# Patient Record
Sex: Male | Born: 1940 | Race: White | Hispanic: No | Marital: Married | Smoking: Former smoker
Health system: Southern US, Community
[De-identification: ages and names within clinical notes are randomized; demographics above are authoritative.]

## PROBLEM LIST (undated history)

## (undated) DIAGNOSIS — D649 Anemia, unspecified: Secondary | ICD-10-CM

## (undated) DIAGNOSIS — M4802 Spinal stenosis, cervical region: Secondary | ICD-10-CM

## (undated) DIAGNOSIS — K2950 Unspecified chronic gastritis without bleeding: Secondary | ICD-10-CM

## (undated) DIAGNOSIS — F419 Anxiety disorder, unspecified: Secondary | ICD-10-CM

## (undated) DIAGNOSIS — I10 Essential (primary) hypertension: Secondary | ICD-10-CM

## (undated) DIAGNOSIS — M48061 Spinal stenosis, lumbar region without neurogenic claudication: Secondary | ICD-10-CM

## (undated) DIAGNOSIS — M1990 Unspecified osteoarthritis, unspecified site: Secondary | ICD-10-CM

## (undated) DIAGNOSIS — F32A Depression, unspecified: Secondary | ICD-10-CM

## (undated) DIAGNOSIS — J449 Chronic obstructive pulmonary disease, unspecified: Secondary | ICD-10-CM

## (undated) DIAGNOSIS — E785 Hyperlipidemia, unspecified: Secondary | ICD-10-CM

## (undated) DIAGNOSIS — K5790 Diverticulosis of intestine, part unspecified, without perforation or abscess without bleeding: Secondary | ICD-10-CM

## (undated) DIAGNOSIS — I639 Cerebral infarction, unspecified: Secondary | ICD-10-CM

## (undated) DIAGNOSIS — G459 Transient cerebral ischemic attack, unspecified: Secondary | ICD-10-CM

## (undated) DIAGNOSIS — F1010 Alcohol abuse, uncomplicated: Secondary | ICD-10-CM

## (undated) DIAGNOSIS — C61 Malignant neoplasm of prostate: Secondary | ICD-10-CM

## (undated) DIAGNOSIS — R51 Headache: Secondary | ICD-10-CM

## (undated) HISTORY — PX: INSERTION PROSTATE RADIATION SEED: SUR718

## (undated) HISTORY — DX: Diverticulosis of intestine, part unspecified, without perforation or abscess without bleeding: K57.90

## (undated) HISTORY — DX: Alcohol abuse, uncomplicated: F10.10

## (undated) HISTORY — DX: Anemia, unspecified: D64.9

## (undated) HISTORY — DX: Depression, unspecified: F32.A

## (undated) HISTORY — DX: Malignant neoplasm of prostate: C61

## (undated) HISTORY — DX: Transient cerebral ischemic attack, unspecified: G45.9

## (undated) HISTORY — PX: COLONOSCOPY: SHX174

## (undated) HISTORY — DX: Hyperlipidemia, unspecified: E78.5

## (undated) HISTORY — PX: RHINOPLASTY: SUR1284

## (undated) HISTORY — DX: Spinal stenosis, cervical region: M48.02

## (undated) HISTORY — DX: Spinal stenosis, lumbar region without neurogenic claudication: M48.061

## (undated) HISTORY — DX: Essential (primary) hypertension: I10

## (undated) HISTORY — DX: Unspecified chronic gastritis without bleeding: K29.50

## (undated) HISTORY — PX: SPINE SURGERY: SHX786

---
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
## 2002-07-02 ENCOUNTER — Encounter: Payer: Self-pay | Admitting: Urology

## 2002-07-02 ENCOUNTER — Encounter: Admission: RE | Admit: 2002-07-02 | Discharge: 2002-07-02 | Payer: Self-pay | Admitting: Urology

## 2002-07-17 ENCOUNTER — Ambulatory Visit: Admission: RE | Admit: 2002-07-17 | Discharge: 2002-08-01 | Payer: Self-pay | Admitting: Radiation Oncology

## 2002-07-23 ENCOUNTER — Encounter: Payer: Self-pay | Admitting: Urology

## 2002-07-23 ENCOUNTER — Encounter: Admission: RE | Admit: 2002-07-23 | Discharge: 2002-07-23 | Payer: Self-pay | Admitting: Urology

## 2002-10-17 DIAGNOSIS — C61 Malignant neoplasm of prostate: Secondary | ICD-10-CM

## 2002-10-17 HISTORY — DX: Malignant neoplasm of prostate: C61

## 2004-01-29 ENCOUNTER — Encounter: Admission: RE | Admit: 2004-01-29 | Discharge: 2004-01-29 | Payer: Self-pay | Admitting: Family Medicine

## 2004-02-08 ENCOUNTER — Encounter: Admission: RE | Admit: 2004-02-08 | Discharge: 2004-02-08 | Payer: Self-pay | Admitting: Family Medicine

## 2009-05-04 ENCOUNTER — Inpatient Hospital Stay (HOSPITAL_COMMUNITY): Admission: EM | Admit: 2009-05-04 | Discharge: 2009-05-05 | Payer: Self-pay | Admitting: Emergency Medicine

## 2009-06-17 DIAGNOSIS — G459 Transient cerebral ischemic attack, unspecified: Secondary | ICD-10-CM

## 2009-06-17 HISTORY — DX: Transient cerebral ischemic attack, unspecified: G45.9

## 2009-06-21 ENCOUNTER — Inpatient Hospital Stay (HOSPITAL_COMMUNITY): Admission: EM | Admit: 2009-06-21 | Discharge: 2009-06-24 | Payer: Self-pay | Admitting: Internal Medicine

## 2009-06-23 ENCOUNTER — Ambulatory Visit: Payer: Self-pay | Admitting: Surgery

## 2009-06-23 ENCOUNTER — Encounter (INDEPENDENT_AMBULATORY_CARE_PROVIDER_SITE_OTHER): Payer: Self-pay | Admitting: Internal Medicine

## 2009-10-15 ENCOUNTER — Encounter: Admission: RE | Admit: 2009-10-15 | Discharge: 2009-10-15 | Payer: Self-pay | Admitting: Family Medicine

## 2010-03-03 ENCOUNTER — Encounter: Admission: RE | Admit: 2010-03-03 | Discharge: 2010-03-03 | Payer: Self-pay | Admitting: Family Medicine

## 2010-04-13 ENCOUNTER — Encounter: Admission: RE | Admit: 2010-04-13 | Discharge: 2010-04-13 | Payer: Self-pay | Admitting: Family Medicine

## 2010-04-22 ENCOUNTER — Encounter: Admission: RE | Admit: 2010-04-22 | Discharge: 2010-04-22 | Payer: Self-pay | Admitting: Family Medicine

## 2010-05-01 ENCOUNTER — Encounter: Admission: RE | Admit: 2010-05-01 | Discharge: 2010-05-01 | Payer: Self-pay | Admitting: Family Medicine

## 2010-06-02 ENCOUNTER — Encounter: Admission: RE | Admit: 2010-06-02 | Discharge: 2010-06-02 | Payer: Self-pay | Admitting: Family Medicine

## 2010-08-25 ENCOUNTER — Other Ambulatory Visit: Payer: Self-pay | Admitting: Emergency Medicine

## 2010-08-26 ENCOUNTER — Ambulatory Visit: Payer: Self-pay | Admitting: Psychiatry

## 2010-08-26 ENCOUNTER — Inpatient Hospital Stay (HOSPITAL_COMMUNITY): Admission: RE | Admit: 2010-08-26 | Discharge: 2010-08-30 | Payer: Self-pay | Admitting: Psychiatry

## 2010-09-29 ENCOUNTER — Inpatient Hospital Stay: Payer: Self-pay | Admitting: Psychiatry

## 2010-10-06 ENCOUNTER — Ambulatory Visit (HOSPITAL_COMMUNITY)
Admission: RE | Admit: 2010-10-06 | Discharge: 2010-10-06 | Payer: Self-pay | Source: Home / Self Care | Attending: Psychiatry | Admitting: Psychiatry

## 2010-10-06 ENCOUNTER — Other Ambulatory Visit (HOSPITAL_COMMUNITY)
Admission: RE | Admit: 2010-10-06 | Discharge: 2010-11-16 | Payer: Self-pay | Source: Home / Self Care | Attending: Psychiatry | Admitting: Psychiatry

## 2010-11-01 LAB — URINE DRUGS OF ABUSE SCREEN W ALC, ROUTINE (REF LAB)
Amphetamine Screen, Ur: NEGATIVE
Amphetamine Screen, Ur: NEGATIVE
Barbiturate Quant, Ur: NEGATIVE
Barbiturate Quant, Ur: NEGATIVE
Benzodiazepines.: NEGATIVE
Benzodiazepines.: NEGATIVE
Cocaine Metabolites: NEGATIVE
Cocaine Metabolites: NEGATIVE
Creatinine,U: 49.8 mg/dL
Creatinine,U: 45.5 mg/dL
Ethyl Alcohol: <10 mg/dL (ref <10)
Ethyl Alcohol: <10 mg/dL (ref <10)
Marijuana Metabolite: NEGATIVE
Marijuana Metabolite: NEGATIVE
Methadone: NEGATIVE
Methadone: NEGATIVE
Opiate Screen, Urine: NEGATIVE
Opiate Screen, Urine: NEGATIVE
Phencyclidine (PCP): NEGATIVE
Phencyclidine (PCP): NEGATIVE
Propoxyphene: NEGATIVE
Propoxyphene: NEGATIVE

## 2010-11-09 LAB — URINE DRUGS OF ABUSE SCREEN W ALC, ROUTINE (REF LAB)
Amphetamine Screen, Ur: NEGATIVE
Barbiturate Quant, Ur: NEGATIVE
Benzodiazepines.: NEGATIVE
Cocaine Metabolites: NEGATIVE
Creatinine,U: 18.2 mg/dL
Ethyl Alcohol: <10 mg/dL (ref <10)
Marijuana Metabolite: NEGATIVE
Methadone: NEGATIVE
Opiate Screen, Urine: NEGATIVE
Phencyclidine (PCP): NEGATIVE
Propoxyphene: NEGATIVE

## 2010-11-11 LAB — URINE DRUGS OF ABUSE SCREEN W ALC, ROUTINE (REF LAB)
Benzodiazepines.: NEGATIVE
Cocaine Metabolites: NEGATIVE
Marijuana Metabolite: NEGATIVE
Methadone: NEGATIVE
Opiate Screen, Urine: NEGATIVE
Phencyclidine (PCP): NEGATIVE
Propoxyphene: NEGATIVE

## 2010-11-17 ENCOUNTER — Ambulatory Visit (HOSPITAL_COMMUNITY): Payer: Medicare Other | Admitting: Psychiatry

## 2010-11-18 ENCOUNTER — Ambulatory Visit (HOSPITAL_COMMUNITY): Payer: Self-pay | Admitting: Psychiatry

## 2010-11-19 ENCOUNTER — Other Ambulatory Visit (HOSPITAL_COMMUNITY): Payer: Medicare Other | Attending: Psychiatry | Admitting: Psychiatry

## 2010-11-19 DIAGNOSIS — Z888 Allergy status to other drugs, medicaments and biological substances status: Secondary | ICD-10-CM | POA: Insufficient documentation

## 2010-11-19 DIAGNOSIS — Z8546 Personal history of malignant neoplasm of prostate: Secondary | ICD-10-CM | POA: Insufficient documentation

## 2010-11-19 DIAGNOSIS — Z91199 Patient's noncompliance with other medical treatment and regimen due to unspecified reason: Secondary | ICD-10-CM | POA: Insufficient documentation

## 2010-11-19 DIAGNOSIS — IMO0002 Reserved for concepts with insufficient information to code with codable children: Secondary | ICD-10-CM | POA: Insufficient documentation

## 2010-11-19 DIAGNOSIS — F1220 Cannabis dependence, uncomplicated: Secondary | ICD-10-CM | POA: Insufficient documentation

## 2010-11-19 DIAGNOSIS — F1020 Alcohol dependence, uncomplicated: Secondary | ICD-10-CM | POA: Insufficient documentation

## 2010-11-19 DIAGNOSIS — Z9119 Patient's noncompliance with other medical treatment and regimen: Secondary | ICD-10-CM | POA: Insufficient documentation

## 2010-11-19 DIAGNOSIS — M549 Dorsalgia, unspecified: Secondary | ICD-10-CM | POA: Insufficient documentation

## 2010-11-19 DIAGNOSIS — Z6379 Other stressful life events affecting family and household: Secondary | ICD-10-CM | POA: Insufficient documentation

## 2010-11-19 DIAGNOSIS — Z880 Allergy status to penicillin: Secondary | ICD-10-CM | POA: Insufficient documentation

## 2010-11-20 LAB — URINE DRUGS OF ABUSE SCREEN W ALC, ROUTINE (REF LAB)
Amphetamine Screen, Ur: NEGATIVE
Benzodiazepines.: NEGATIVE
Cocaine Metabolites: NEGATIVE
Methadone: NEGATIVE
Opiate Screen, Urine: NEGATIVE
Propoxyphene: NEGATIVE

## 2010-11-22 ENCOUNTER — Other Ambulatory Visit (HOSPITAL_COMMUNITY): Payer: Medicare Other | Admitting: Psychiatry

## 2010-11-24 ENCOUNTER — Other Ambulatory Visit (HOSPITAL_COMMUNITY): Payer: Medicare Other | Admitting: Psychiatry

## 2010-11-26 ENCOUNTER — Other Ambulatory Visit (HOSPITAL_COMMUNITY): Payer: Medicare Other | Admitting: Psychiatry

## 2010-11-29 ENCOUNTER — Other Ambulatory Visit (HOSPITAL_COMMUNITY): Payer: Medicare Other | Admitting: Psychiatry

## 2010-12-01 ENCOUNTER — Other Ambulatory Visit (HOSPITAL_COMMUNITY): Payer: Medicare Other | Admitting: Psychiatry

## 2010-12-03 ENCOUNTER — Other Ambulatory Visit (HOSPITAL_COMMUNITY): Payer: Medicare Other | Admitting: Psychiatry

## 2010-12-06 ENCOUNTER — Other Ambulatory Visit (HOSPITAL_COMMUNITY): Payer: Medicare Other | Admitting: Psychiatry

## 2010-12-08 ENCOUNTER — Other Ambulatory Visit (HOSPITAL_COMMUNITY): Payer: Medicare Other | Admitting: Psychiatry

## 2010-12-10 ENCOUNTER — Other Ambulatory Visit (HOSPITAL_COMMUNITY): Payer: Medicare Other | Admitting: Psychiatry

## 2010-12-13 ENCOUNTER — Other Ambulatory Visit (HOSPITAL_COMMUNITY): Payer: Medicare Other | Admitting: Psychiatry

## 2010-12-15 ENCOUNTER — Other Ambulatory Visit (HOSPITAL_COMMUNITY): Payer: Medicare Other | Admitting: Psychiatry

## 2010-12-17 ENCOUNTER — Other Ambulatory Visit (HOSPITAL_COMMUNITY): Payer: Medicare Other | Admitting: Psychiatry

## 2010-12-17 ENCOUNTER — Other Ambulatory Visit (HOSPITAL_COMMUNITY): Payer: Medicare Other | Attending: Psychiatry | Admitting: Psychiatry

## 2010-12-20 ENCOUNTER — Other Ambulatory Visit (HOSPITAL_COMMUNITY): Payer: Medicare Other | Admitting: Psychiatry

## 2010-12-22 ENCOUNTER — Other Ambulatory Visit (HOSPITAL_COMMUNITY): Payer: Medicare Other | Admitting: Psychiatry

## 2010-12-24 ENCOUNTER — Other Ambulatory Visit (HOSPITAL_COMMUNITY): Payer: Medicare Other | Admitting: Psychiatry

## 2010-12-27 ENCOUNTER — Other Ambulatory Visit (HOSPITAL_COMMUNITY): Payer: Medicare Other | Admitting: Psychiatry

## 2010-12-28 LAB — BASIC METABOLIC PANEL
BUN: 12 mg/dL (ref 6–23)
Calcium: 9.2 mg/dL (ref 8.4–10.5)
GFR calc non Af Amer: >60 mL/min (ref >60)
Glucose, Bld: 89 mg/dL (ref 70–99)
Potassium: 4.3 mEq/L (ref 3.5–5.1)
Sodium: 134 mEq/L — ABNORMAL LOW (ref 135–145)

## 2010-12-28 LAB — CBC
HCT: 38.5 % — ABNORMAL LOW (ref 39.0–52.0)
Hemoglobin: 13.3 g/dL (ref 13.0–17.0)
MCHC: 34.4 g/dL (ref 30.0–36.0)
RDW: 13.6 % (ref 11.5–15.5)
WBC: 6.9 10*3/uL (ref 4.0–10.5)

## 2010-12-28 LAB — ETHANOL: Alcohol, Ethyl (B): 146 mg/dL — ABNORMAL HIGH (ref 0–10)

## 2010-12-28 LAB — DIFFERENTIAL
Basophils Absolute: 0 10*3/uL (ref 0.0–0.1)
Basophils Relative: 0 % (ref 0–1)
Lymphocytes Relative: 15 % (ref 12–46)
Neutro Abs: 4.8 10*3/uL (ref 1.7–7.7)
Neutrophils Relative %: 69 % (ref 43–77)

## 2010-12-28 LAB — HEPATIC FUNCTION PANEL
Bilirubin, Direct: 0.2 mg/dL (ref 0.0–0.3)
Indirect Bilirubin: 0.8 mg/dL (ref 0.3–0.9)
Total Protein: 5.9 g/dL — ABNORMAL LOW (ref 6.0–8.3)

## 2010-12-28 LAB — RAPID URINE DRUG SCREEN, HOSP PERFORMED
Amphetamines: NOT DETECTED
Opiates: NOT DETECTED
Tetrahydrocannabinol: NOT DETECTED

## 2010-12-28 LAB — FOLATE: Folate: >20 ng/mL

## 2010-12-28 LAB — TSH: TSH: 1.135 u[IU]/mL (ref 0.350–4.500)

## 2010-12-28 LAB — VITAMIN B12: Vitamin B-12: 667 pg/mL (ref 211–911)

## 2010-12-29 ENCOUNTER — Other Ambulatory Visit (HOSPITAL_COMMUNITY): Payer: Medicare Other | Admitting: Psychiatry

## 2010-12-31 ENCOUNTER — Other Ambulatory Visit (HOSPITAL_COMMUNITY): Payer: Medicare Other | Admitting: Psychiatry

## 2011-01-03 ENCOUNTER — Other Ambulatory Visit (HOSPITAL_COMMUNITY): Payer: Medicare Other | Admitting: Psychiatry

## 2011-01-05 ENCOUNTER — Other Ambulatory Visit (HOSPITAL_COMMUNITY): Payer: Medicare Other | Admitting: Psychiatry

## 2011-01-07 ENCOUNTER — Other Ambulatory Visit (HOSPITAL_COMMUNITY): Payer: Medicare Other | Admitting: Psychiatry

## 2011-01-10 ENCOUNTER — Other Ambulatory Visit (HOSPITAL_COMMUNITY): Payer: Medicare Other | Admitting: Psychiatry

## 2011-01-12 ENCOUNTER — Other Ambulatory Visit (HOSPITAL_COMMUNITY): Payer: Medicare Other | Admitting: Psychiatry

## 2011-01-21 LAB — PHOSPHORUS: Phosphorus: 4.5 mg/dL (ref 2.3–4.6)

## 2011-01-21 LAB — COMPREHENSIVE METABOLIC PANEL
ALT: 17 U/L (ref 0–53)
Alkaline Phosphatase: 48 U/L (ref 39–117)
BUN: 15 mg/dL (ref 6–23)
CO2: 32 mEq/L (ref 19–32)
Calcium: 9.1 mg/dL (ref 8.4–10.5)
Chloride: 99 mEq/L (ref 96–112)
Glucose, Bld: 100 mg/dL — ABNORMAL HIGH (ref 70–99)
Glucose, Bld: 97 mg/dL (ref 70–99)
Potassium: 4.3 mEq/L (ref 3.5–5.1)
Sodium: 137 mEq/L (ref 135–145)
Total Bilirubin: 1.4 mg/dL — ABNORMAL HIGH (ref 0.3–1.2)
Total Protein: 6.4 g/dL (ref 6.0–8.3)
Total Protein: 6.9 g/dL (ref 6.0–8.3)

## 2011-01-21 LAB — DIFFERENTIAL
Basophils Absolute: 0 10*3/uL (ref 0.0–0.1)
Basophils Relative: 0 % (ref 0–1)
Eosinophils Absolute: 0.3 10*3/uL (ref 0.0–0.7)
Monocytes Relative: 8 % (ref 3–12)
Neutrophils Relative %: 57 % (ref 43–77)

## 2011-01-21 LAB — HEMOGLOBIN A1C
Hgb A1c MFr Bld: 5.3 % (ref 4.6–6.1)
Mean Plasma Glucose: 105 mg/dL

## 2011-01-21 LAB — CBC
HCT: 41.9 % (ref 39.0–52.0)
HCT: 43.9 % (ref 39.0–52.0)
Hemoglobin: 14.2 g/dL (ref 13.0–17.0)
Hemoglobin: 15.1 g/dL (ref 13.0–17.0)
MCHC: 34 g/dL (ref 30.0–36.0)
RBC: 4.49 MIL/uL (ref 4.22–5.81)
RDW: 12.9 % (ref 11.5–15.5)
RDW: 13.2 % (ref 11.5–15.5)
WBC: 6.4 10*3/uL (ref 4.0–10.5)

## 2011-01-21 LAB — LIPID PANEL
Cholesterol: 206 mg/dL — ABNORMAL HIGH (ref 0–200)
LDL Cholesterol: 111 mg/dL — ABNORMAL HIGH (ref 0–99)
Total CHOL/HDL Ratio: 2.5 RATIO

## 2011-01-21 LAB — T4: T4, Total: 8.7 ug/dL (ref 5.0–12.5)

## 2011-01-21 LAB — T3: T3, Total: 77 ng/dl — ABNORMAL LOW (ref 80.0–204.0)

## 2011-01-23 LAB — DIFFERENTIAL
Basophils Absolute: 0.1 10*3/uL (ref 0.0–0.1)
Lymphocytes Relative: 11 % — ABNORMAL LOW (ref 12–46)
Lymphs Abs: 1.2 10*3/uL (ref 0.7–4.0)
Neutro Abs: 9 10*3/uL — ABNORMAL HIGH (ref 1.7–7.7)
Neutrophils Relative %: 81 % — ABNORMAL HIGH (ref 43–77)

## 2011-01-23 LAB — COMPREHENSIVE METABOLIC PANEL
ALT: 17 U/L (ref 0–53)
ALT: 20 U/L (ref 0–53)
AST: 39 U/L — ABNORMAL HIGH (ref 0–37)
Alkaline Phosphatase: 53 U/L (ref 39–117)
BUN: 15 mg/dL (ref 6–23)
CO2: 22 mEq/L (ref 19–32)
CO2: 24 mEq/L (ref 19–32)
Calcium: 8.7 mg/dL (ref 8.4–10.5)
Calcium: 9.7 mg/dL (ref 8.4–10.5)
GFR calc Af Amer: 42 mL/min — ABNORMAL LOW (ref >60)
GFR calc non Af Amer: >60 mL/min (ref >60)
Glucose, Bld: 107 mg/dL — ABNORMAL HIGH (ref 70–99)
Glucose, Bld: 93 mg/dL (ref 70–99)
Potassium: 3.5 mEq/L (ref 3.5–5.1)
Sodium: 132 mEq/L — ABNORMAL LOW (ref 135–145)
Sodium: 136 mEq/L (ref 135–145)
Total Protein: 5.9 g/dL — ABNORMAL LOW (ref 6.0–8.3)
Total Protein: 7.5 g/dL (ref 6.0–8.3)

## 2011-01-23 LAB — URINE MICROSCOPIC-ADD ON

## 2011-01-23 LAB — PHOSPHORUS: Phosphorus: 3.1 mg/dL (ref 2.3–4.6)

## 2011-01-23 LAB — RAPID URINE DRUG SCREEN, HOSP PERFORMED
Amphetamines: NOT DETECTED
Barbiturates: NOT DETECTED
Benzodiazepines: POSITIVE — AB
Cocaine: NOT DETECTED
Opiates: POSITIVE — AB
Tetrahydrocannabinol: POSITIVE — AB

## 2011-01-23 LAB — CBC
HCT: 43.1 % (ref 39.0–52.0)
Hemoglobin: 14.9 g/dL (ref 13.0–17.0)
RBC: 4.5 MIL/uL (ref 4.22–5.81)
RDW: 12.7 % (ref 11.5–15.5)

## 2011-01-23 LAB — ACETAMINOPHEN LEVEL
Acetaminophen (Tylenol), Serum: <10 ug/mL — ABNORMAL LOW (ref 10–30)
Acetaminophen (Tylenol), Serum: <10 ug/mL — ABNORMAL LOW (ref 10–30)

## 2011-01-23 LAB — CARDIAC PANEL(CRET KIN+CKTOT+MB+TROPI)
CK, MB: 3 ng/mL (ref 0.3–4.0)
CK, MB: 3.2 ng/mL (ref 0.3–4.0)
Total CK: 470 U/L — ABNORMAL HIGH (ref 7–232)
Total CK: 516 U/L — ABNORMAL HIGH (ref 7–232)
Troponin I: 0.04 ng/mL (ref 0.00–0.06)

## 2011-01-23 LAB — CK TOTAL AND CKMB (NOT AT ARMC)
CK, MB: 3.9 ng/mL (ref 0.3–4.0)
Relative Index: 0.8 (ref 0.0–2.5)

## 2011-01-23 LAB — URINALYSIS, ROUTINE W REFLEX MICROSCOPIC
Nitrite: NEGATIVE
Specific Gravity, Urine: 1.017 (ref 1.005–1.030)
Urobilinogen, UA: 0.2 mg/dL (ref 0.0–1.0)

## 2011-01-23 LAB — TROPONIN I: Troponin I: 0.04 ng/mL (ref 0.00–0.06)

## 2011-01-23 LAB — MAGNESIUM: Magnesium: 2.3 mg/dL (ref 1.5–2.5)

## 2011-01-23 LAB — POCT CARDIAC MARKERS: Troponin i, poc: <0.05 ng/mL (ref 0.00–0.09)

## 2011-03-01 NOTE — H&P (Signed)
NAME:  Jesse Robbins, Jesse Robbins NO.:  [id number]   MEDICAL RECORD NO.:  [id number]          PATIENT TYPE:  EMS   LOCATION:  ED                           FACILITY[facility]  [facility]   PHYSICIAN:  Altha Harm, MDDATE OF BIRTH:  02/16/1941   DATE OF ADMISSION:  05/04/2009  DATE OF DISCHARGE:                              HISTORY & PHYSICAL   CHIEF COMPLAINT:  Confusion and altered mental state.   HISTORY OF PRESENT ILLNESS:  Please note that the patient is sedated  secondary to agitation and is unable to contribute to the history of  present illness.  All information is gathered from an interview with the  patient's wife.  According to the patient's wife Mr. Boxley had a root  canal performed on Friday secondary to an abscess in his tooth.  The  patient was sent home on Vicodin 5/250 and took 15 tablets in a 24 hour  period.  The patient starting approximately 24 hours ago had  approximately 1-5 episodes of emesis, nonbilious, no hematemesis.  The  patient continued to have pain and was seen in the endodontist's office  today where he underwent an incision and drainage.  The patient however  was in a state of confusion and agitation and was directed by the  endodontist to be brought to the emergency room for further evaluation.  In addition, the patient's wife stated he had a low grade temperature of  99.  He has had no diarrhea.  He did not complain of a headache and she  is unable to give any more information regarding his history of present  illness.  In addition, the patient is a habitual drinker, drinking  approximately one gallon of grain alcohol in a month.  The patient was  able to offer that his last drink was on Saturday evening which would be  48 hours ago.  This is all the information that the patient's wife is  able to contribute in the absence of the patient being able to give any  information.   PAST MEDICAL HISTORY:  Significant for:  1. Prostate cancer treated  6 years ago.  2. Hypertension, diet-controlled.  3. History of depression.  4. History of anxiety.  5. Alcohol dependence.   FAMILY HISTORY:  Significant for cancer in both parents who are both  deceased, and also congestive heart failure in his mother.  The patient  has a brother who has coronary artery disease and had a myocardial  infarction at age 32.   SOCIAL HISTORY:  The patient as noted above drinks one gallon of grain  alcohol per month.  His last known drink was on Saturday night.  The  patient has no tobacco use but does use marijuana habitually.   CURRENT MEDICATIONS:  Include the following.  1. Hytrin 6 mg p.o. nightly.  2. Flonase 1 puff inhaled in each nostril daily.  3. Klonopin 1 mg p.o. t.i.d.  4. Ibuprofen 600 mg p.o. t.i.d.  5. Prilosec OTC 1-2 tablets p.o. daily.   ALLERGIES:  ARE TO PENICILLIN AND CODEINE.   PRIMARY CARE  PHYSICIAN:  The patient had been under the care of Dr.  Duaine Dredge who has recently changed his  practice to [facility] practice.  Thus,  currently the patient is unassigned.  His endodontist:  The patient's  wife is unable to provide the name, however the phone number is [phone]  [phone].   REVIEW OF SYSTEMS:  The patient is unable to provide a review of systems  secondary to his state of sedation.   In the emergency room studies done here include the following:  Metabolic panel shows a sodium of 132, potassium of 3.5, chloride 94,  bicarb 22, BUN 18, creatinine 1.93.  A urinalysis is positive for  ketones, however, shows no elements consistent with urinary tract  infection.  His urine drug screen is positive for benzodiazepine, opiate  and cannabinol.  A chest x-ray is negative for any acute pulmonary  process and a CT of the head is negative for any acute intracranial  process.   PHYSICAL EXAMINATION:  As noted, the patient is sedated lying in bed.  His wife is at the bedside.  VITAL SIGNS:  Were as follows.  Temperature 97, blood pressure  154/73,  heart rate 110, respiratory rate 18, O2 saturations are 97% on room air.  HEENT EXAMINATION:  The patient appears normocephalic, atraumatic.  Pupils equally round and reactive to light.  Extraocular movements are  intact.  There is no conjunctival pallor.  No icterus noted.  Nasopharynx is erythematous, no polyps noted.  Oropharynx:  The patient  has a fowl smelling odor coming from the mouth.  He has a drain in  place.  However, the patient is uncooperative with a full oral  examination.  He does have some degree of confusion occurring when  aroused.  NECK EXAMINATION:  Trachea is midline.  No masses, no thyromegaly, no  JVD, no carotid bruit.  RESPIRATORY EXAMINATION:  The patient has a normal respiratory effort,  equal excursion bilaterally.  No wheezing or rhonchi noted.  Clear to  auscultation.  No increase of vocal fremitus.  CARDIOVASCULAR:  He has a normal S1, S2.  He is mildly tachycardic.  No  murmurs, rubs or gallops noted.  PMI is nondisplaced.  No heaves or  thrills on palpation.  ABDOMINAL EXAMINATION:  The patient has an umbilical piercing.  His  abdomen is obese, soft, nontender, nondistended.  No masses, no  hepatosplenomegaly noted.  LYMPH NODE SURVEY:  He has no cervical, axillary or inguinal  lymphadenopathy noted.  MUSCULOSKELETAL:  He has no warmth, swelling or erythema around the  joints.  He has no spinal tenderness.  NEUROLOGICAL:  The patient is fluctuating between sedation and  agitation.  He shows no focal neurological deficits and appears to be  moving all extremities symmetrically against gravity.  The patient in a  state of agitation is moving between the lying and sitting position  without any need for assistance.  Gait is deferred at this time.  DTRs  have been attempted, however, the patient is unable to cooperate with  them.  PSYCHIATRIC:  Again the patient is fluctuating between sedation and  agitation.  Currently he has impaired cognition  and insight and I am  unable to assess his recall.   ASSESSMENT/PLAN:  This is a patient who presents with:  1. Encephalopathy, likely related to alcohol withdrawal.  2. Alcohol withdrawal.  3. Mild dehydration.  4. Opiate overdose.  5. Hyponatremia.  6. Emesis.  7. Acute renal insufficiency.   It appears that  the patient likely had a reaction to an excessive  ingestion of opiates and Tylenol which probably led to his emesis and  has now progressed into a state of withdrawal.  The patient has been  unable to ingest any alcohol for the last 48-72 hours.  1. The patient appears to be a full blown alcohol withdrawal at this      time and we will admit the patient and place him on Ativan      withdrawal protocol.  2. The patient will be given intravenous fluids as he is mildly      dehydrated and he will avoid any opiates at this time.  3. He patient is chronically on benzodiazepines and as long as he is      awake, alert enough to prevent aspiration, we will continue his      oral benzodiazepines.  If not, the patient will be treated with      Ativan which should suffice for preventing seizure from      benzodiazepine withdrawal.  4. The patient does have post prostate cancer treatment with Hytrin      and the patient will be continued on his Hytrin as scheduled pre-      hospital.  However, if the patient is unable to take his Hytrin      orally, than a Foley catheter may have to be placed in order to      prevent outlet obstruction.  However, at this time we will hold off      on placing the Foley and attempt to give the patient his Hytrin if      he is able to take it without risk of aspiration.  5. The patient as noted may be on 0.9 normal saline with added      thiamine and folate both to treat his dehydration and his      hyponatremia.  6. The patient will be started on a clear liquid diet and advance as      tolerated to a 2 gram sodium diet.  7. We will reassess his renal  function in the morning after he has had      hydration to assess for resolution of his acute renal      insufficiency.  8. In terms of his status post incision and drainage, we will start      the patient on IV clindamycin.  The patient's wife has reported      that the patient was given a prescription for antibiotics.      However, she is unable to a give [place] the name of he antibiotic at      this time.  Total time for this process:  1 hour.      Altha Harm, MD  Electronically Signed     MAM/MEDQ  D:  05/04/2009  T:  05/04/2009  Job:  [id number]

## 2011-03-01 NOTE — Discharge Summary (Signed)
NAME:  Jesse Robbins, Jesse Robbins                 ACCOUNT NO.:  [id number]   MEDICAL RECORD NO.:  [id number]          PATIENT TYPE:  INP   LOCATION:  1502                         FACILITY:  [facility]   PHYSICIAN:  Marcellus Scott, MD     DATE OF BIRTH:  November 25, 1940   DATE OF ADMISSION:  05/04/2009  DATE OF DISCHARGE:  05/05/2009                               DISCHARGE SUMMARY   PRIMARY MEDICAL DOCTOR:  Gentry Fitz.   DISCHARGE DIAGNOSES:  1. Toxic encephalopathy secondary to opioid overdose and alcohol      withdrawal.  2. Hypokalemia secondary to nausea and vomiting.  3. Nausea and vomiting.  4. Acute renal failure secondary to dehydration.  5. Dehydration.  6. Alcohol dependence.  7. Substance abuse, tetrahydrocannabinol.  8. History of anxiety and depression.  9. Diet-controlled hypertension.  10.Mild rhabdomyolysis.  11.Status post dental extraction.  12.History of prostate cancer.   DISCHARGE MEDICATIONS:  Since the patient will be signing out against  medical advice, he will not be provided any medications.  However, the  patient takes Prilosec 20 mg p.o. sometimes 1 to 3 tablets daily.  He  has been advised to use multivitamins daily.   PROCEDURES:  1. CT of the head without contrast.  Impression:  Atrophy with small      vessel chronic ischemic changes of deep cerebral white matter.  No      definite acute intracranial abnormalities with limitations of exam.  2. Chest x-ray on July 19.  Impression:  No acute findings on the      cervical lordotic exam.   PERTINENT LABS:  Cardiac panel remarkable for elevated CK in the range  of 500, but CK-MB and troponin normal.  Phosphorus 3.1, magnesium 2.3,  comprehensive metabolic panel today with potassium 3, BUN 15, creatinine  1.19.  Hepatic panel only remarkable for a total bilirubin of 1.4 and  protein of 5.9.  Acetaminophen levels repeated twice, less than 10.  CBC  with hemoglobin 14.9, hematocrit 43.1, white blood cell 11.1, platelets  245,000.  Blood alcohol level less than 5.  Creatinine on admission  1.93.  Urine drug screen positive for benzodiazepines, opiates, and  tetrahydrocannabinol.   CONSULTATIONS:  None.   HOSPITAL COURSE AND PATIENT DISPOSITION:  Jesse Robbins is a 70 year old  Caucasian male patient with a history of alcohol dependence, substance  abuse, anxiety and depression, prostate cancer, diet-controlled  hypertension who was brought to the emergency room secondary to altered  mental status.  He has recently had tooth extractions and was prescribed  Vicodin, which he seems to have taken more than he is supposed to.  He  then started having recurrent episodes of nausea and vomiting without  any features of upper GI bleed.  He was seen at the endodontist's office  and, because of his confusion and agitation, was asked to come to the ED  for further evaluation.  His last alcohol drink prior to admission was  almost 48 hours prior  Evaluation in the emergency room revealed the  patient with fluctuating level of consciousness between sedation and  agitation.  He did not have any focal neurological deficits.  He was,  therefore, admitted for further evaluation and management.   PROBLEM LIST:  1. Toxic encephalopathy secondary to overuse of opiates and alcohol      withdrawal.  The patient was admitted to telemetry with no      arrhythmia alarms.  He has been awake, alert, and oriented since      this morning.  He has been refusing multiple care measures such as      potassium, indicating that it causes abdominal pain, wanting his      diet advanced, demanding Prilosec, although he has received      Protonix, which has been explained to him.  I interviewed the      patient in the presence of his wife and his nurse for today.  He      indicates that he wants to go home.  We advised him that it is      unsafe for him to be discharged given that he is still in alcohol      withdrawal.  We explained to him the  risks of leaving against      medical advice prematurely, which could include and is not limited      to intractable nausea and vomiting, tremulousness, anxiety, altered      mental status, dangerous cardiac arrhythmias especially in the      context of hypokalemia which may even lead to death.  He verbalizes      understanding to all this and says he is still going to go home and      is aware of the risks.  His wife does concur that his mental status      is back to baseline.  He indicates that if these symptoms and signs      that he has are due to alcohol withdrawal, then he is going to go      home and drink alcohol to make them go away.  He denies having an      alcohol problem, which the wife vehemently contradicts.  He was      somewhat rude and used some foul language also.  Despite our      explaining to him multiple times about the risks of leaving against      medical advice, the patient has decided to do so.  He does have the      capacity to make medical decisions.  We believe that he is making      bad judgment.  He has been advised to abstain from alcohol and use      multivitamins, and continue his Prilosec.  He has also been advised      to seek immediate medical attention if he changes his mind.  He      verbalizes understanding.  2. Hypokalemia.  The patient took 1 dose of the potassium supplement      and refused the next.  3. Acute renal failure secondary to dehydration, nausea, and vomiting,      resolved with IV fluids.  4. Dehydration, improved with IV fluids.  5. Alcohol dependence.  As per discussion #1.  Abstinence counseled.  6. Substance abuse.  Again, abstinence counseled.  7. History of anxiety and depression.  The patient does not have any      hallucinations, delusions, or suicidal or homicidal ideations.   DISPOSITION:  The patient, at this time, signed his against medical  advice form and left AMA ambulating with his wife.  Time taken in   coordinating this discharge was 35 minutes.      Marcellus Scott, MD  Electronically Signed     AH/MEDQ  D:  05/05/2009  T:  05/05/2009  Job:  [id number]

## 2011-05-27 ENCOUNTER — Other Ambulatory Visit: Payer: Self-pay | Admitting: Family Medicine

## 2011-05-31 ENCOUNTER — Ambulatory Visit
Admission: RE | Admit: 2011-05-31 | Discharge: 2011-05-31 | Disposition: A | Discharge status: Home / Self Care | Payer: Medicare Other | Source: Ambulatory Visit | Attending: Family Medicine | Admitting: Family Medicine

## 2011-09-06 ENCOUNTER — Other Ambulatory Visit: Payer: Self-pay | Admitting: Emergency Medicine

## 2011-09-06 ENCOUNTER — Ambulatory Visit
Admission: RE | Admit: 2011-09-06 | Discharge: 2011-09-06 | Disposition: A | Discharge status: Home / Self Care | Payer: Medicare Other | Source: Ambulatory Visit | Attending: Emergency Medicine | Admitting: Emergency Medicine

## 2011-09-06 DIAGNOSIS — J069 Acute upper respiratory infection, unspecified: Secondary | ICD-10-CM

## 2012-05-31 ENCOUNTER — Ambulatory Visit
Admission: RE | Admit: 2012-05-31 | Discharge: 2012-05-31 | Disposition: A | Discharge status: Home / Self Care | Payer: Medicare Other | Source: Ambulatory Visit | Attending: Family Medicine | Admitting: Family Medicine

## 2012-05-31 ENCOUNTER — Other Ambulatory Visit: Payer: Self-pay | Admitting: Family Medicine

## 2012-05-31 DIAGNOSIS — R05 Cough: Secondary | ICD-10-CM

## 2012-05-31 DIAGNOSIS — R059 Cough, unspecified: Secondary | ICD-10-CM

## 2012-05-31 DIAGNOSIS — R0602 Shortness of breath: Secondary | ICD-10-CM

## 2013-01-24 ENCOUNTER — Ambulatory Visit
Admission: RE | Admit: 2013-01-24 | Discharge: 2013-01-24 | Disposition: A | Discharge status: Home / Self Care | Payer: Medicare Other | Source: Ambulatory Visit | Attending: Family Medicine | Admitting: Family Medicine

## 2013-01-24 ENCOUNTER — Other Ambulatory Visit: Payer: Self-pay | Admitting: Family Medicine

## 2013-01-24 DIAGNOSIS — M79604 Pain in right leg: Secondary | ICD-10-CM

## 2013-01-24 DIAGNOSIS — M25551 Pain in right hip: Secondary | ICD-10-CM

## 2013-01-28 ENCOUNTER — Other Ambulatory Visit: Payer: Self-pay | Admitting: Family Medicine

## 2013-01-28 DIAGNOSIS — M25551 Pain in right hip: Secondary | ICD-10-CM

## 2013-02-06 ENCOUNTER — Ambulatory Visit
Admission: RE | Admit: 2013-02-06 | Discharge: 2013-02-06 | Disposition: A | Discharge status: Home / Self Care | Payer: Medicare Other | Source: Ambulatory Visit | Attending: Family Medicine | Admitting: Family Medicine

## 2013-02-06 DIAGNOSIS — M25551 Pain in right hip: Secondary | ICD-10-CM

## 2013-02-06 MED ORDER — METHYLPREDNISOLONE ACETATE 40 MG/ML INJ SUSP (RADIOLOG: 120.0000 mg | Freq: Once | INTRAMUSCULAR | Status: DC

## 2013-02-06 MED ORDER — IOHEXOL 180 MG/ML  SOLN
1.0000 mL | Freq: Once | INTRAMUSCULAR | Status: AC | PRN
  Administered 2013-02-06: 1 mL via INTRA_ARTICULAR

## 2013-02-22 ENCOUNTER — Other Ambulatory Visit: Payer: Self-pay | Admitting: Family Medicine

## 2013-02-22 ENCOUNTER — Other Ambulatory Visit: Payer: Medicare Other

## 2013-02-22 DIAGNOSIS — M169 Osteoarthritis of hip, unspecified: Secondary | ICD-10-CM

## 2013-02-24 ENCOUNTER — Ambulatory Visit
Admission: RE | Admit: 2013-02-24 | Discharge: 2013-02-24 | Disposition: A | Discharge status: Home / Self Care | Payer: Medicare Other | Source: Ambulatory Visit | Attending: Family Medicine | Admitting: Family Medicine

## 2013-02-24 DIAGNOSIS — M169 Osteoarthritis of hip, unspecified: Secondary | ICD-10-CM

## 2013-02-28 ENCOUNTER — Other Ambulatory Visit: Payer: Medicare Other

## 2013-04-03 ENCOUNTER — Other Ambulatory Visit (HOSPITAL_COMMUNITY): Payer: Self-pay | Admitting: Orthopaedic Surgery

## 2013-04-05 ENCOUNTER — Ambulatory Visit (INDEPENDENT_AMBULATORY_CARE_PROVIDER_SITE_OTHER): Payer: Medicare Other | Admitting: Cardiology

## 2013-04-05 ENCOUNTER — Encounter: Payer: Self-pay | Admitting: Cardiology

## 2013-04-05 VITALS — BP 130/70 | HR 73 | Ht 69.0 in | Wt 174.0 lb

## 2013-04-05 DIAGNOSIS — E785 Hyperlipidemia, unspecified: Secondary | ICD-10-CM

## 2013-04-05 DIAGNOSIS — I10 Essential (primary) hypertension: Secondary | ICD-10-CM

## 2013-04-05 DIAGNOSIS — Z01818 Encounter for other preprocedural examination: Secondary | ICD-10-CM

## 2013-04-05 NOTE — Patient Instructions (Addendum)
Daisey Must with your surgery.  We have faxed over a clearance letter to the surgeon and your primary doctor.  If you have any questions, please give [place] a call.

## 2013-04-05 NOTE — Progress Notes (Signed)
Patient ID: [name], male   DOB: Feb 04, 1941, 72 y.o.   MRN: [id number]  Clinic Note: HPI: Jesse Robbins is a 72 y.o. male with a PMH below who presents today for Pre-Operative Risk Assessment for Right Hip Arthroplasty.  Jesse Robbins is a very pleasant 61 all gentleman with a history of a possible TIA in 2010 as well as spinal stenosis, hypertension hypercholesterolemia who has been doing relatively well with the spinal stenosis in the mid reactive walking much every day for at least a mile or so at least 20 meds at time up and down stairs and around the block without any problems until about 2 months ago when he suffered a subacute injury to his right hip. Estimated blood less mobile due to pain his abdomen to the activities to do. He is referred here for evaluation for possible hip arthroplasty by Dr. Magnus Robbins.   Prior to his injury, he denied any chest pain or shortness of breath with rest or exertion. Since his injury his abdomen exerts it is much as it has still not had any symptoms with activities daily. He denies any PND, orthopnea or edema to suggest heart failure. He is a little swelling in the right leg simply h because he is favoring this leg.  He denies any palpitations, lightheadedness, dizziness, wheezes or syncope or near-syncope. No TIA or RCA symptoms -with his last episode having like a TIA being in 2010 this was the only episode.  Echocardiogram September 2010: Normal LV size and function, EF greater than 50%. Impaired relaxation. Moderate aortic sclerosis but no stenosis.   Past Medical History  Diagnosis Date  . TIA (transient ischemic attack) 06/2009  . Prostate cancer 2004    seed implant and beam radiation  . HTN (hypertension)   . Hyperlipidemia     Prior Cardiac Evaluation and Past Surgical History: Past Surgical History  Procedure Laterality Date  . Tonsillectomy  1950  . Appendectomy  1952  . Rhinoplasty        Allergies  Allergen Reactions  .  Penicillins     Current Outpatient Prescriptions  Medication Sig Dispense Refill  . aspirin 81 MG tablet Take 81 mg by mouth daily.      Marland Kitchen b complex vitamins capsule Take 1 capsule by mouth 2 (two) times daily.      . celecoxib (CELEBREX) 200 MG capsule Take 200 mg by mouth 2 (two) times daily.      . cholecalciferol (VITAMIN D) 1000 UNITS tablet Take 1,000 Units by mouth daily.      . clonazePAM (KLONOPIN) 1 MG tablet Take 1 mg by mouth at bedtime as needed.      . fluticasone (FLONASE) 50 MCG/ACT nasal spray Place 2 sprays into the nose daily.      Marland Kitchen gabapentin (NEURONTIN) 600 MG tablet Take 600 mg by mouth 4 (four) times daily.      Marland Kitchen HYDROcodone-acetaminophen (NORCO/VICODIN) 5-325 MG per tablet       . losartan (COZAAR) 100 MG tablet Take 100 mg by mouth daily.      Marland Kitchen omeprazole (PRILOSEC) 20 MG capsule Take 20 mg by mouth 2 (two) times daily.      Marland Kitchen oxymetazoline (AFRIN) 0.05 % nasal spray Place 2 sprays into the nose daily.      . Probiotic Product (PROBIOTIC DAILY PO) Take by mouth 2 (two) times daily.      Marland Kitchen terazosin (HYTRIN) 2 MG capsule Take 2 mg by mouth.  4 tablets at night       No current facility-administered medications for this visit.    History   Social History  . Marital Status: Married    Spouse Name: N/A    Number of Children: N/A  . Years of Education: N/A   Occupational History  . Not on file.   Social History Main Topics  . Smoking status: Former Smoker    Quit date: 04/05/1998  . Smokeless tobacco: Not on file  . Alcohol Use: Yes     Comment: 6 drinks every day  . Drug Use: Not on file  . Sexually Active: Not on file   Other Topics Concern  . Not on file   Social History Narrative  . No narrative on file   Family History  Problem Relation Age of Onset  . Cancer Mother     lung and colon  . Stroke Father   . Cancer Maternal Grandfather     lung  . Heart attack Brother   . Hypertension Brother     hyperlipidemia   ROS: A comprehensive  Review of Systems - Negative except pertinent postitives & negatives noted above. Musculoskeletal ROS: positive for - joint pain, muscle pain and pain in back - lower and hip - right  PHYSICAL EXAM BP 130/70  Pulse 73  Ht 5\' 9"  (1.753 m)  Wt 174 lb (78.926 kg)  BMI 25.68 kg/m2 General appearance: alert, cooperative, appears stated age, no distress and pleasant mood & affect; well nourished & groomed. Neck: no adenopathy, no carotid bruit, no JVD, supple, symmetrical, trachea midline and thyroid not enlarged, symmetric, no tenderness/mass/nodules Lungs: clear to auscultation bilaterally, normal percussion bilaterally and non-labored, good air movement Heart: regular rate and rhythm, S1, S2 normal, 2/6 crescendo decrescendo early peaking systolic ejection murmur, click, rub or gallop and normal apical impulse Abdomen: soft, non-tender; bowel sounds normal; no masses,  no organomegaly Extremities: extremities normal, atraumatic, no cyanosis or edema, no edema, redness or tenderness in the calves or thighs and no ulcers, gangrene or trophic changes Pulses: 2+ and symmetric Neurologic: Mental status: Alert, oriented, thought content appropriate Cranial nerves: normal HEENT: [place]/AT, EOMI, MMM, anicteric sclera.   [id number] today: Yes Rate:73 , Rhythm: NSR, normal ECG  ASSESSMENT: Previously very active, and otherwise healthy gentleman with HTN & HLD and a possible TIA (Cerebrovascular Disease), but no DM, CAD or PAD, CHF or CKD.  Prior to the onset of his Hip "injury" < 2 months ago, he was very active and able to perform ~6-8 METS with no anginal CP or dyspnea at rest or exertion.    PREOPERATIVE CARDIAC RISK ASSESSMENT :  Jesse Hai Criteria) Revised Cardiac Risk Index:  High Risk Surgery: no;  Defined as Intraperitoneal, intrathoracic or suprainguinal vascular  Active CAD: no;    CHF: no;  Cerebrovascular Disease: yes; TIA, but no clear indication of true cerebrovascular  disase.  Diabetes: no; On Insulin: no  CKD (Cr >~ 2): no;  Total: ~1 Estimated Risk of Adverse Outcome: Low Risk  Estimated Risk of MI, PE, VF/VT (Cardiac Arrest), Complete Heart Block: ~0.9%   ACC/AHA Guidelines for "Clearance": Intermediate Risk Surgery  Step 1 - Need for Emergency Surgery: No:   If Yes - go straight to OR with perioperative surveillance  Step 2 - Active Cardiac Conditions (Unstable Angina, Decompensated HF, Significant  Arrhytmias - Complete HB, Mobitz II, Symptomatic VT or SVT, Severe Aortic Stenosis - mean gradient > 40 mmHg, Valve area < 1.0 cm2):  No:   If Yes - Evaluate & Treat per ACC/AHA Guidelines  Step 3 -  Low Risk Surgery: No: Hip Arthroplasty  If Yes --> proceed to OR  If No --> Step 4  Step 4 - Functional Capacity >= 4 METS without symptoms: Yes prior to his Hip Injury.  If Yes --> proceed to OR  If No --> Step 5   Step 5 --  Clinical Risk Factors (CRF)   3 or more: No:   If Yes -- assess Surgical Risk, --   (High Risk Non-cardiac), Intraabdominal or thoracic vascular surgery consider testing if it will change management.  Intermediate Risk: Proceed to OR with HR control, or consider testing if it will change management  1-2 or more CRFs: Yes  If Yes -- assess Surgical Risk, --   (High Risk Non-cardiac), Intraabdominal or thoracic vascular surgery --> Proceed to OR, or consider testing if it will change management.  Intermediate Risk: Proceed to OR with HR control, or consider testing if it will change management   RECOMMENDATION: Given the relatively urgent need for him to have his surgery due to the level of pain & disability, my recommendation is to proceed to OR with no further Cardiac Evaluation.  If the surgery will be delayed for > 4 weeks, would consider starting a beta blocker @ Metoprolol 25 mg bid.  Would defer to PCP based upon the patient's Blood Pressure.  Followup: PRN, for treatment of hypotension and  dyslipidemia to primary physician.   Marykay Lex, M.D., M.S. [facility] [address] [place], [place]  [address]  [phone] Pager # [phone] 04/05/2013 3:12 PM

## 2013-04-06 DIAGNOSIS — I10 Essential (primary) hypertension: Secondary | ICD-10-CM | POA: Insufficient documentation

## 2013-04-06 DIAGNOSIS — Z01818 Encounter for other preprocedural examination: Secondary | ICD-10-CM | POA: Insufficient documentation

## 2013-04-06 DIAGNOSIS — E785 Hyperlipidemia, unspecified: Secondary | ICD-10-CM | POA: Insufficient documentation

## 2013-04-06 NOTE — Assessment & Plan Note (Signed)
Relatively well controlled on ARB. If his blood pressure were to be borderline or elevated, would recommend preoperatively started 25 mg twice a day metoprolol if he can be started 2-4 weeks preoperatively. Would not start less than that preoperatively.

## 2013-04-06 NOTE — Assessment & Plan Note (Signed)
Monitor by his primary physician. He is not currently on a statin, says that his levels been relatively well-controlled. Would defer to primary physician.

## 2013-04-06 NOTE — Assessment & Plan Note (Signed)
Based on the risk assessment above, would recommend proceeding with his operation without any further cardiac evaluation. [facility] will be readily available for consultation if the need arises postoperatively.

## 2013-04-09 ENCOUNTER — Ambulatory Visit: Payer: Medicare Other | Admitting: Cardiovascular Disease

## 2013-04-10 ENCOUNTER — Ambulatory Visit: Payer: Medicare Other | Admitting: Cardiology

## 2013-04-12 ENCOUNTER — Encounter (HOSPITAL_COMMUNITY): Payer: Self-pay

## 2013-04-15 ENCOUNTER — Encounter (HOSPITAL_COMMUNITY): Payer: Self-pay

## 2013-04-15 ENCOUNTER — Encounter (HOSPITAL_COMMUNITY)
Admission: RE | Admit: 2013-04-15 | Discharge: 2013-04-15 | Disposition: A | Discharge status: Home / Self Care | Payer: Medicare Other | Source: Ambulatory Visit | Attending: Orthopaedic Surgery | Admitting: Orthopaedic Surgery

## 2013-04-15 HISTORY — DX: Headache: R51

## 2013-04-15 HISTORY — DX: Anxiety disorder, unspecified: F41.9

## 2013-04-15 HISTORY — DX: Cerebral infarction, unspecified: I63.9

## 2013-04-15 HISTORY — DX: Chronic obstructive pulmonary disease, unspecified: J44.9

## 2013-04-15 LAB — CBC
MCH: 32.9 pg (ref 26.0–34.0)
MCHC: 35.6 g/dL (ref 30.0–36.0)
MCV: 92.5 fL (ref 78.0–100.0)
Platelets: 205 10*3/uL (ref 150–400)
RDW: 14.4 % (ref 11.5–15.5)
WBC: 6.6 10*3/uL (ref 4.0–10.5)

## 2013-04-15 LAB — URINALYSIS, ROUTINE W REFLEX MICROSCOPIC
Bilirubin Urine: NEGATIVE
Ketones, ur: 15 mg/dL — AB
Nitrite: NEGATIVE
Protein, ur: NEGATIVE mg/dL
Urobilinogen, UA: 1 mg/dL (ref 0.0–1.0)

## 2013-04-15 LAB — HEPATIC FUNCTION PANEL
ALT: 41 U/L (ref 0–53)
Albumin: 4.2 g/dL (ref 3.5–5.2)
Alkaline Phosphatase: 79 U/L (ref 39–117)
Total Protein: 7.4 g/dL (ref 6.0–8.3)

## 2013-04-15 LAB — BASIC METABOLIC PANEL
Calcium: 10.1 mg/dL (ref 8.4–10.5)
Chloride: 97 mEq/L (ref 96–112)
Potassium: 4.1 mEq/L (ref 3.5–5.1)
Sodium: 138 mEq/L (ref 135–145)

## 2013-04-15 LAB — TYPE AND SCREEN
ABO/RH(D): A NEG
Antibody Screen: NEGATIVE

## 2013-04-15 LAB — ABO/RH: ABO/RH(D): A NEG

## 2013-04-15 MED ORDER — CLINDAMYCIN PHOSPHATE 900 MG/50ML IV SOLN
900.0000 mg | INTRAVENOUS | Status: AC
  Administered 2013-04-16: 900 mg via INTRAVENOUS
  Filled 2013-04-15: qty 50

## 2013-04-15 NOTE — Progress Notes (Signed)
I went ahead and ordered a hep func panel to [facility]...when interviewing him, he stated that he uses marijuana (for medicinal purposes) and has wine everyday.......DA

## 2013-04-15 NOTE — Pre-Procedure Instructions (Signed)
Jesse Robbins  04/15/2013   Your procedure is scheduled on:  Tuesday, July 1ST   Report to [facility] at  10:15 AM.             (YOU WILL COME THROUGH ENTRANCE "A"--FOLLOW HALLWAY TO EAST ELEVATORS AND GO TO 3RD FLOOR.   Call this number if you have problems the morning of surgery: [phone]   Remember:   Do not eat food or drink liquids after midnight TONIGHT.   Take these medicines the morning of surgery with A SIP OF WATER: PRILOSEC, FLONASE, GABAPENTIN, VICODIN, ALBUTEROL INHALER & SYMBICORT.   Do not wear jewelry.  Do not wear lotions, powders, or colognes. You may NOT wear deodorant.   Men may shave face and neck.   Do not bring valuables to the hospital.  [facility] is not responsible for any belongings or valuables.  Contacts, dentures or bridgework may not be worn into surgery.   Leave suitcase in the car. After surgery it may be brought to your room.  For patients admitted to the hospital, checkout time is 11:00 AM the day of discharge.   Name and phone number of your driver: ANITA BROCK-Goyer   SPOUSE   Special Instructions: Shower using CHG 2 nights before surgery and the night before surgery.  If you shower the day of surgery use CHG.  Use special wash - you have one bottle of CHG for all showers.  You should use approximately 1/3 of the bottle for each shower.   Please read over the following fact sheets that you were given: Pain Booklet, Blood Transfusion Information, MRSA Information and Surgical Site Infection Prevention

## 2013-04-16 ENCOUNTER — Inpatient Hospital Stay (HOSPITAL_COMMUNITY): Payer: Medicare Other

## 2013-04-16 ENCOUNTER — Encounter (HOSPITAL_COMMUNITY): Payer: Self-pay | Admitting: Anesthesiology

## 2013-04-16 ENCOUNTER — Inpatient Hospital Stay (HOSPITAL_COMMUNITY)
Admission: RE | Admit: 2013-04-16 | Discharge: 2013-04-18 | DRG: 470 | Disposition: A | Discharge status: Home Health | Payer: Medicare Other | Source: Ambulatory Visit | Attending: Orthopaedic Surgery | Admitting: Orthopaedic Surgery

## 2013-04-16 ENCOUNTER — Inpatient Hospital Stay (HOSPITAL_COMMUNITY): Payer: Medicare Other | Admitting: Anesthesiology

## 2013-04-16 ENCOUNTER — Encounter (HOSPITAL_COMMUNITY)
Admission: RE | Disposition: A | Discharge status: Home Health | Payer: Self-pay | Source: Ambulatory Visit | Attending: Orthopaedic Surgery

## 2013-04-16 ENCOUNTER — Encounter (HOSPITAL_COMMUNITY): Payer: Self-pay | Admitting: Surgery

## 2013-04-16 DIAGNOSIS — Z7982 Long term (current) use of aspirin: Secondary | ICD-10-CM

## 2013-04-16 DIAGNOSIS — Z9089 Acquired absence of other organs: Secondary | ICD-10-CM

## 2013-04-16 DIAGNOSIS — F411 Generalized anxiety disorder: Secondary | ICD-10-CM | POA: Diagnosis present

## 2013-04-16 DIAGNOSIS — M87051 Idiopathic aseptic necrosis of right femur: Secondary | ICD-10-CM | POA: Diagnosis present

## 2013-04-16 DIAGNOSIS — E785 Hyperlipidemia, unspecified: Secondary | ICD-10-CM | POA: Diagnosis present

## 2013-04-16 DIAGNOSIS — J4489 Other specified chronic obstructive pulmonary disease: Secondary | ICD-10-CM | POA: Diagnosis present

## 2013-04-16 DIAGNOSIS — Z79899 Other long term (current) drug therapy: Secondary | ICD-10-CM

## 2013-04-16 DIAGNOSIS — F1010 Alcohol abuse, uncomplicated: Secondary | ICD-10-CM | POA: Diagnosis present

## 2013-04-16 DIAGNOSIS — I10 Essential (primary) hypertension: Secondary | ICD-10-CM | POA: Diagnosis present

## 2013-04-16 DIAGNOSIS — Z8673 Personal history of transient ischemic attack (TIA), and cerebral infarction without residual deficits: Secondary | ICD-10-CM

## 2013-04-16 DIAGNOSIS — M169 Osteoarthritis of hip, unspecified: Secondary | ICD-10-CM | POA: Diagnosis present

## 2013-04-16 DIAGNOSIS — Z801 Family history of malignant neoplasm of trachea, bronchus and lung: Secondary | ICD-10-CM

## 2013-04-16 DIAGNOSIS — Z8249 Family history of ischemic heart disease and other diseases of the circulatory system: Secondary | ICD-10-CM

## 2013-04-16 DIAGNOSIS — M1610 Unilateral primary osteoarthritis, unspecified hip: Secondary | ICD-10-CM | POA: Diagnosis present

## 2013-04-16 DIAGNOSIS — Z880 Allergy status to penicillin: Secondary | ICD-10-CM

## 2013-04-16 DIAGNOSIS — J449 Chronic obstructive pulmonary disease, unspecified: Secondary | ICD-10-CM | POA: Diagnosis present

## 2013-04-16 DIAGNOSIS — Z800 Family history of malignant neoplasm of digestive organs: Secondary | ICD-10-CM

## 2013-04-16 DIAGNOSIS — Z8546 Personal history of malignant neoplasm of prostate: Secondary | ICD-10-CM

## 2013-04-16 DIAGNOSIS — D62 Acute posthemorrhagic anemia: Secondary | ICD-10-CM | POA: Diagnosis not present

## 2013-04-16 DIAGNOSIS — Z823 Family history of stroke: Secondary | ICD-10-CM

## 2013-04-16 DIAGNOSIS — Z87891 Personal history of nicotine dependence: Secondary | ICD-10-CM

## 2013-04-16 DIAGNOSIS — Z923 Personal history of irradiation: Secondary | ICD-10-CM

## 2013-04-16 DIAGNOSIS — M87059 Idiopathic aseptic necrosis of unspecified femur: Principal | ICD-10-CM | POA: Diagnosis present

## 2013-04-16 HISTORY — DX: Unspecified osteoarthritis, unspecified site: M19.90

## 2013-04-16 HISTORY — PX: TOTAL HIP ARTHROPLASTY: SHX124

## 2013-04-16 LAB — RAPID URINE DRUG SCREEN, HOSP PERFORMED
Amphetamines: NOT DETECTED
Benzodiazepines: NOT DETECTED
Tetrahydrocannabinol: POSITIVE — AB

## 2013-04-16 SURGERY — ARTHROPLASTY, HIP, TOTAL, ANTERIOR APPROACH
Anesthesia: General | Site: Hip | Laterality: Right | Wound class: Clean

## 2013-04-16 MED ORDER — FUROSEMIDE 20 MG PO TABS
20.0000 mg | ORAL_TABLET | Freq: Every day | ORAL | Status: DC
  Administered 2013-04-18: 20 mg via ORAL
  Filled 2013-04-16 (×3): qty 1

## 2013-04-16 MED ORDER — SODIUM CHLORIDE 0.9 % IV SOLN
INTRAVENOUS | Status: DC
  Administered 2013-04-16 – 2013-04-17 (×2): via INTRAVENOUS

## 2013-04-16 MED ORDER — ACETAMINOPHEN 325 MG PO TABS
650.0000 mg | ORAL_TABLET | Freq: Four times a day (QID) | ORAL | Status: DC | PRN
  Administered 2013-04-17 – 2013-04-18 (×2): 650 mg via ORAL
  Filled 2013-04-16 (×2): qty 2

## 2013-04-16 MED ORDER — DEXTROSE 5 % IV SOLN
500.0000 mg | Freq: Four times a day (QID) | INTRAVENOUS | Status: DC | PRN
  Filled 2013-04-16: qty 5

## 2013-04-16 MED ORDER — METOCLOPRAMIDE HCL 10 MG PO TABS: 5.0000 mg | ORAL_TABLET | Freq: Three times a day (TID) | ORAL | Status: DC | PRN

## 2013-04-16 MED ORDER — FENTANYL CITRATE 0.05 MG/ML IJ SOLN
INTRAMUSCULAR | Status: DC | PRN
  Administered 2013-04-16: 50 ug via INTRAVENOUS
  Administered 2013-04-16: 100 ug via INTRAVENOUS
  Administered 2013-04-16 (×3): 50 ug via INTRAVENOUS

## 2013-04-16 MED ORDER — DOCUSATE SODIUM 100 MG PO CAPS
100.0000 mg | ORAL_CAPSULE | Freq: Two times a day (BID) | ORAL | Status: DC
  Administered 2013-04-17 – 2013-04-18 (×2): 100 mg via ORAL
  Filled 2013-04-16 (×5): qty 1

## 2013-04-16 MED ORDER — ONDANSETRON HCL 4 MG/2ML IJ SOLN
INTRAMUSCULAR | Status: DC | PRN
  Administered 2013-04-16: 4 mg via INTRAVENOUS

## 2013-04-16 MED ORDER — FOLIC ACID 1 MG PO TABS
1.0000 mg | ORAL_TABLET | Freq: Every day | ORAL | Status: DC
  Administered 2013-04-17 – 2013-04-18 (×2): 1 mg via ORAL
  Filled 2013-04-16 (×3): qty 1

## 2013-04-16 MED ORDER — OXYCODONE HCL 5 MG PO TABS
5.0000 mg | ORAL_TABLET | ORAL | Status: DC | PRN
  Administered 2013-04-17 – 2013-04-18 (×7): 10 mg via ORAL
  Filled 2013-04-16 (×7): qty 2

## 2013-04-16 MED ORDER — METOCLOPRAMIDE HCL 5 MG/ML IJ SOLN: 5.0000 mg | Freq: Three times a day (TID) | INTRAMUSCULAR | Status: DC | PRN

## 2013-04-16 MED ORDER — BISACODYL 10 MG RE SUPP: 10.0000 mg | Freq: Every day | RECTAL | Status: DC | PRN

## 2013-04-16 MED ORDER — POLYETHYLENE GLYCOL 3350 17 G PO PACK: 17.0000 g | PACK | Freq: Every day | ORAL | Status: DC | PRN

## 2013-04-16 MED ORDER — PROMETHAZINE HCL 25 MG/ML IJ SOLN: 6.2500 mg | INTRAMUSCULAR | Status: DC | PRN

## 2013-04-16 MED ORDER — METHOCARBAMOL 500 MG PO TABS
500.0000 mg | ORAL_TABLET | Freq: Four times a day (QID) | ORAL | Status: DC | PRN
  Administered 2013-04-17: 500 mg via ORAL
  Filled 2013-04-16: qty 1

## 2013-04-16 MED ORDER — ALUM & MAG HYDROXIDE-SIMETH 200-200-20 MG/5ML PO SUSP: 30.0000 mL | ORAL | Status: DC | PRN

## 2013-04-16 MED ORDER — ASPIRIN EC 325 MG PO TBEC
325.0000 mg | DELAYED_RELEASE_TABLET | Freq: Every day | ORAL | Status: DC
  Administered 2013-04-17 – 2013-04-18 (×2): 325 mg via ORAL
  Filled 2013-04-16 (×3): qty 1

## 2013-04-16 MED ORDER — MUPIROCIN 2 % EX OINT
1.0000 "application " | TOPICAL_OINTMENT | Freq: Two times a day (BID) | CUTANEOUS | Status: DC
  Administered 2013-04-16 – 2013-04-18 (×4): 1 via NASAL
  Filled 2013-04-16: qty 22

## 2013-04-16 MED ORDER — PROPOFOL 10 MG/ML IV BOLUS
INTRAVENOUS | Status: DC | PRN
  Administered 2013-04-16: 160 mg via INTRAVENOUS

## 2013-04-16 MED ORDER — ROCURONIUM BROMIDE 100 MG/10ML IV SOLN
INTRAVENOUS | Status: DC | PRN
  Administered 2013-04-16: 40 mg via INTRAVENOUS
  Administered 2013-04-16 (×2): 10 mg via INTRAVENOUS

## 2013-04-16 MED ORDER — ADULT MULTIVITAMIN W/MINERALS CH
1.0000 | ORAL_TABLET | Freq: Every day | ORAL | Status: DC
  Administered 2013-04-17 – 2013-04-18 (×2): 1 via ORAL
  Filled 2013-04-16 (×3): qty 1

## 2013-04-16 MED ORDER — 0.9 % SODIUM CHLORIDE (POUR BTL) OPTIME
TOPICAL | Status: DC | PRN
  Administered 2013-04-16: 1000 mL

## 2013-04-16 MED ORDER — DIPHENHYDRAMINE HCL 12.5 MG/5ML PO ELIX: 12.5000 mg | ORAL_SOLUTION | ORAL | Status: DC | PRN

## 2013-04-16 MED ORDER — HYDROMORPHONE HCL PF 1 MG/ML IJ SOLN
1.0000 mg | INTRAMUSCULAR | Status: DC | PRN
  Administered 2013-04-16 – 2013-04-17 (×4): 1 mg via INTRAVENOUS
  Filled 2013-04-16 (×3): qty 1

## 2013-04-16 MED ORDER — FERROUS SULFATE 325 (65 FE) MG PO TABS
325.0000 mg | ORAL_TABLET | Freq: Three times a day (TID) | ORAL | Status: DC
  Administered 2013-04-17 – 2013-04-18 (×5): 325 mg via ORAL
  Filled 2013-04-16 (×8): qty 1

## 2013-04-16 MED ORDER — MEPERIDINE HCL 25 MG/ML IJ SOLN: 6.2500 mg | INTRAMUSCULAR | Status: DC | PRN

## 2013-04-16 MED ORDER — LORAZEPAM 2 MG/ML IJ SOLN: 1.0000 mg | Freq: Four times a day (QID) | INTRAMUSCULAR | Status: DC | PRN

## 2013-04-16 MED ORDER — VITAMIN B-1 100 MG PO TABS
100.0000 mg | ORAL_TABLET | Freq: Every day | ORAL | Status: DC
  Administered 2013-04-17 – 2013-04-18 (×2): 100 mg via ORAL
  Filled 2013-04-16 (×3): qty 1

## 2013-04-16 MED ORDER — ZOLPIDEM TARTRATE 5 MG PO TABS: 5.0000 mg | ORAL_TABLET | Freq: Every evening | ORAL | Status: DC | PRN

## 2013-04-16 MED ORDER — HYDROMORPHONE HCL PF 1 MG/ML IJ SOLN
INTRAMUSCULAR | Status: AC
  Filled 2013-04-16: qty 1

## 2013-04-16 MED ORDER — PHENYLEPHRINE HCL 10 MG/ML IJ SOLN
INTRAMUSCULAR | Status: DC | PRN
  Administered 2013-04-16 (×3): 80 ug via INTRAVENOUS
  Administered 2013-04-16: 40 ug via INTRAVENOUS
  Administered 2013-04-16: 80 ug via INTRAVENOUS
  Administered 2013-04-16: 120 ug via INTRAVENOUS
  Administered 2013-04-16 (×4): 80 ug via INTRAVENOUS
  Administered 2013-04-16: 120 ug via INTRAVENOUS

## 2013-04-16 MED ORDER — LIDOCAINE HCL 4 % MT SOLN
OROMUCOSAL | Status: DC | PRN
  Administered 2013-04-16: 4 mL via TOPICAL

## 2013-04-16 MED ORDER — NEOSTIGMINE METHYLSULFATE 1 MG/ML IJ SOLN
INTRAMUSCULAR | Status: DC | PRN
  Administered 2013-04-16: 3 mg via INTRAVENOUS

## 2013-04-16 MED ORDER — ONDANSETRON HCL 4 MG PO TABS: 4.0000 mg | ORAL_TABLET | Freq: Four times a day (QID) | ORAL | Status: DC | PRN

## 2013-04-16 MED ORDER — GABAPENTIN 600 MG PO TABS
600.0000 mg | ORAL_TABLET | Freq: Four times a day (QID) | ORAL | Status: DC
  Administered 2013-04-16 – 2013-04-18 (×6): 600 mg via ORAL
  Filled 2013-04-16 (×10): qty 1

## 2013-04-16 MED ORDER — CHLORHEXIDINE GLUCONATE CLOTH 2 % EX PADS
6.0000 | MEDICATED_PAD | Freq: Every day | CUTANEOUS | Status: DC
  Administered 2013-04-17 – 2013-04-18 (×2): 6 via TOPICAL

## 2013-04-16 MED ORDER — ONDANSETRON HCL 4 MG/2ML IJ SOLN: 4.0000 mg | Freq: Four times a day (QID) | INTRAMUSCULAR | Status: DC | PRN

## 2013-04-16 MED ORDER — ACETAMINOPHEN 650 MG RE SUPP: 650.0000 mg | Freq: Four times a day (QID) | RECTAL | Status: DC | PRN

## 2013-04-16 MED ORDER — MIDAZOLAM HCL 2 MG/2ML IJ SOLN: 0.5000 mg | Freq: Once | INTRAMUSCULAR | Status: DC | PRN

## 2013-04-16 MED ORDER — GLYCOPYRROLATE 0.2 MG/ML IJ SOLN
INTRAMUSCULAR | Status: DC | PRN
  Administered 2013-04-16: 0.4 mg via INTRAVENOUS

## 2013-04-16 MED ORDER — LACTATED RINGERS IV SOLN
INTRAVENOUS | Status: DC | PRN
  Administered 2013-04-16 (×2): via INTRAVENOUS

## 2013-04-16 MED ORDER — PHENOL 1.4 % MT LIQD: 1.0000 | OROMUCOSAL | Status: DC | PRN

## 2013-04-16 MED ORDER — CLINDAMYCIN PHOSPHATE 600 MG/50ML IV SOLN
600.0000 mg | Freq: Four times a day (QID) | INTRAVENOUS | Status: AC
  Administered 2013-04-16 (×2): 600 mg via INTRAVENOUS
  Filled 2013-04-16 (×2): qty 50

## 2013-04-16 MED ORDER — PANTOPRAZOLE SODIUM 40 MG PO TBEC
40.0000 mg | DELAYED_RELEASE_TABLET | Freq: Every day | ORAL | Status: DC
  Administered 2013-04-17 – 2013-04-18 (×2): 40 mg via ORAL
  Filled 2013-04-16 (×2): qty 1

## 2013-04-16 MED ORDER — FENTANYL CITRATE 0.05 MG/ML IJ SOLN: 25.0000 ug | INTRAMUSCULAR | Status: DC | PRN

## 2013-04-16 MED ORDER — THIAMINE HCL 100 MG/ML IJ SOLN
100.0000 mg | Freq: Every day | INTRAMUSCULAR | Status: DC
  Administered 2013-04-16: 100 mg via INTRAVENOUS
  Filled 2013-04-16 (×3): qty 1

## 2013-04-16 MED ORDER — TERAZOSIN HCL 2 MG PO CAPS
2.0000 mg | ORAL_CAPSULE | Freq: Once | ORAL | Status: AC
  Administered 2013-04-17: 2 mg via ORAL
  Filled 2013-04-16: qty 1

## 2013-04-16 MED ORDER — OXYCODONE HCL ER 10 MG PO T12A
20.0000 mg | EXTENDED_RELEASE_TABLET | Freq: Two times a day (BID) | ORAL | Status: DC
  Administered 2013-04-16 – 2013-04-18 (×4): 20 mg via ORAL
  Filled 2013-04-16: qty 1
  Filled 2013-04-16 (×2): qty 2
  Filled 2013-04-16: qty 1
  Filled 2013-04-16: qty 2

## 2013-04-16 MED ORDER — MENTHOL 3 MG MT LOZG: 1.0000 | LOZENGE | OROMUCOSAL | Status: DC | PRN

## 2013-04-16 MED ORDER — LOSARTAN POTASSIUM 50 MG PO TABS
100.0000 mg | ORAL_TABLET | Freq: Every day | ORAL | Status: DC
  Administered 2013-04-17 – 2013-04-18 (×2): 100 mg via ORAL
  Filled 2013-04-16 (×2): qty 2

## 2013-04-16 MED ORDER — LIDOCAINE HCL (CARDIAC) 20 MG/ML IV SOLN
INTRAVENOUS | Status: DC | PRN
  Administered 2013-04-16: 40 mg via INTRAVENOUS

## 2013-04-16 MED ORDER — SODIUM CHLORIDE 0.9 % IR SOLN
Status: DC | PRN
  Administered 2013-04-16: 3000 mL

## 2013-04-16 MED ORDER — EPHEDRINE SULFATE 50 MG/ML IJ SOLN
INTRAMUSCULAR | Status: DC | PRN
  Administered 2013-04-16: 10 mg via INTRAVENOUS

## 2013-04-16 MED ORDER — LOSARTAN POTASSIUM 50 MG PO TABS
100.0000 mg | ORAL_TABLET | Freq: Every day | ORAL | Status: DC
Start: 2013-04-16 — End: 2013-04-16

## 2013-04-16 MED ORDER — LORAZEPAM 1 MG PO TABS
1.0000 mg | ORAL_TABLET | Freq: Four times a day (QID) | ORAL | Status: DC | PRN
  Administered 2013-04-17 – 2013-04-18 (×5): 1 mg via ORAL
  Filled 2013-04-16 (×5): qty 1

## 2013-04-16 SURGICAL SUPPLY — 54 items
BANDAGE GAUZE ELAST BULKY 4 IN (GAUZE/BANDAGES/DRESSINGS) IMPLANT
BLADE SAW SGTL 18X1.27X75 (BLADE) ×2 IMPLANT
BLADE SURG ROTATE 9660 (MISCELLANEOUS) IMPLANT
CAPT HIP PF MOP ×2 IMPLANT
CELLS DAT CNTRL 66122 CELL SVR (MISCELLANEOUS) ×1 IMPLANT
CLOSURE STERI-STRIP 1/4X4 (GAUZE/BANDAGES/DRESSINGS) ×2 IMPLANT
CLOTH BEACON ORANGE TIMEOUT ST (SAFETY) ×2 IMPLANT
COVER BACK TABLE 24X17X13 BIG (DRAPES) IMPLANT
COVER SURGICAL LIGHT HANDLE (MISCELLANEOUS) ×2 IMPLANT
DERMABOND ADHESIVE PROPEN (GAUZE/BANDAGES/DRESSINGS) ×1
DERMABOND ADVANCED .7 DNX6 (GAUZE/BANDAGES/DRESSINGS) ×1 IMPLANT
DRAPE C-ARM 42X72 X-RAY (DRAPES) ×2 IMPLANT
DRAPE STERI IOBAN 125X83 (DRAPES) ×2 IMPLANT
DRAPE U-SHAPE 47X51 STRL (DRAPES) ×6 IMPLANT
DRSG AQUACEL AG ADV 3.5X10 (GAUZE/BANDAGES/DRESSINGS) ×2 IMPLANT
DRSG MEPILEX BORDER 4X8 (GAUZE/BANDAGES/DRESSINGS) ×2 IMPLANT
DURAPREP 26ML APPLICATOR (WOUND CARE) ×2 IMPLANT
ELECT BLADE 4.0 EZ CLEAN MEGAD (MISCELLANEOUS)
ELECT BLADE TIP CTD 4 INCH (ELECTRODE) ×2 IMPLANT
ELECT CAUTERY BLADE 6.4 (BLADE) ×2 IMPLANT
ELECT REM PT RETURN 9FT ADLT (ELECTROSURGICAL) ×2
ELECTRODE BLDE 4.0 EZ CLN MEGD (MISCELLANEOUS) IMPLANT
ELECTRODE REM PT RTRN 9FT ADLT (ELECTROSURGICAL) ×1 IMPLANT
FACESHIELD LNG OPTICON STERILE (SAFETY) ×4 IMPLANT
GAUZE XEROFORM 1X8 LF (GAUZE/BANDAGES/DRESSINGS) ×2 IMPLANT
GLOVE BIOGEL PI IND STRL 8 (GLOVE) ×2 IMPLANT
GLOVE BIOGEL PI INDICATOR 8 (GLOVE) ×2
GLOVE ECLIPSE 8.0 STRL XLNG CF (GLOVE) ×2 IMPLANT
GLOVE SURG ORTHO 8.0 STRL STRW (GLOVE) ×2 IMPLANT
GOWN STRL REIN XL XLG (GOWN DISPOSABLE) ×4 IMPLANT
HANDPIECE INTERPULSE COAX TIP (DISPOSABLE) ×1
KIT BASIN OR (CUSTOM PROCEDURE TRAY) ×2 IMPLANT
KIT ROOM TURNOVER OR (KITS) ×2 IMPLANT
MANIFOLD NEPTUNE II (INSTRUMENTS) ×2 IMPLANT
NS IRRIG 1000ML POUR BTL (IV SOLUTION) ×2 IMPLANT
PACK TOTAL JOINT (CUSTOM PROCEDURE TRAY) ×2 IMPLANT
PAD ARMBOARD 7.5X6 YLW CONV (MISCELLANEOUS) ×4 IMPLANT
RTRCTR WOUND ALEXIS 18CM MED (MISCELLANEOUS) ×2
SET HNDPC FAN SPRY TIP SCT (DISPOSABLE) ×1 IMPLANT
SPONGE LAP 18X18 X RAY DECT (DISPOSABLE) IMPLANT
SPONGE LAP 4X18 X RAY DECT (DISPOSABLE) IMPLANT
STAPLER VISISTAT 35W (STAPLE) ×2 IMPLANT
SUT ETHIBOND NAB CT1 #1 30IN (SUTURE) ×4 IMPLANT
SUT MNCRL AB 3-0 PS2 27 (SUTURE) ×2 IMPLANT
SUT VIC AB 0 CT1 27 (SUTURE) ×2
SUT VIC AB 0 CT1 27XBRD ANBCTR (SUTURE) ×2 IMPLANT
SUT VIC AB 1 CT1 27 (SUTURE) ×2
SUT VIC AB 1 CT1 27XBRD ANBCTR (SUTURE) ×2 IMPLANT
SUT VIC AB 2-0 CT1 27 (SUTURE) ×2
SUT VIC AB 2-0 CT1 TAPERPNT 27 (SUTURE) ×2 IMPLANT
TOWEL OR 17X24 6PK STRL BLUE (TOWEL DISPOSABLE) ×2 IMPLANT
TOWEL OR 17X26 10 PK STRL BLUE (TOWEL DISPOSABLE) ×2 IMPLANT
TRAY FOLEY CATH 14FR (SET/KITS/TRAYS/PACK) IMPLANT
WATER STERILE IRR 1000ML POUR (IV SOLUTION) ×4 IMPLANT

## 2013-04-16 NOTE — Anesthesia Preprocedure Evaluation (Signed)
Anesthesia Evaluation  Patient identified by MRN, date of birth, ID band Patient awake    Reviewed: Allergy & Precautions, H&P , Patient's Chart, lab work & pertinent test results, reviewed documented beta blocker date and time   History of Anesthesia Complications Negative for: history of anesthetic complications  Airway Mallampati: II TM Distance: >3 FB Neck ROM: full    Dental no notable dental hx.    Pulmonary COPD breath sounds clear to auscultation  Pulmonary exam normal       Cardiovascular Exercise Tolerance: Good hypertension, Rhythm:regular Rate:Normal     Neuro/Psych  Headaches, TIACVA    GI/Hepatic   Endo/Other    Renal/GU      Musculoskeletal   Abdominal   Peds  Hematology   Anesthesia Other Findings TIA (transient ischemic attack) 06/2009   Prostate cancer 2004 seed implant and beam radiation    HTN (hypertension)     Hyperlipidemia        COPD (chronic obstructive pulmonary disease)     Headache(784.0)   migraine    Stroke   2010 TIA Anxiety    Reproductive/Obstetrics                           Anesthesia Physical Anesthesia Plan  ASA: III  Anesthesia Plan: General ETT   Post-op Pain Management:    Induction:   Airway Management Planned:   Additional Equipment:   Intra-op Plan:   Post-operative Plan:   Informed Consent: I have reviewed the patients History and Physical, chart, labs and discussed the procedure including the risks, benefits and alternatives for the proposed anesthesia with the patient or authorized representative who has indicated his/her understanding and acceptance.   Dental Advisory Given  Plan Discussed with: CRNA and Surgeon  Anesthesia Plan Comments:         Anesthesia Quick Evaluation

## 2013-04-16 NOTE — Transfer of Care (Signed)
Immediate Anesthesia Transfer of Care Note  Patient: Jesse Robbins  Procedure(s) Performed: Procedure(s): RIGHT TOTAL HIP ARTHROPLASTY ANTERIOR APPROACH (Right)  Patient Location: PACU  Anesthesia Type:General  Level of Consciousness: sedated  Airway & Oxygen Therapy: Patient Spontanous Breathing and Patient connected to face mask oxygen  Post-op Assessment: Report given to PACU RN and Post -op Vital signs reviewed and stable  Post vital signs: Reviewed and stable  Complications: No apparent anesthesia complications

## 2013-04-16 NOTE — H&P (Signed)
TOTAL HIP ADMISSION H&P  Patient is admitted for right total hip arthroplasty.  Subjective:  Chief Complaint: right hip pain  HPI: Jesse Robbins, 72 y.o. male, has a history of pain and functional disability in the right hip(s) due to AVN and patient has failed non-surgical conservative treatments for greater than 12 weeks to include NSAID's and/or analgesics and activity modification.  Onset of symptoms was abrupt starting 1 years ago with rapidlly worsening course since that time.The patient noted no past surgery on the right hip(s).  Patient currently rates pain in the right hip at 10 out of 10 with activity. Patient has night pain, worsening of pain with activity and weight bearing, trendelenberg gait, pain that interfers with activities of daily living, pain with passive range of motion and crepitus. Patient has evidence of subchondral cysts, subchondral sclerosis, periarticular osteophytes and joint space narrowing by imaging studies. This condition presents safety issues increasing the risk of falls.  There is no current active infection.  Patient Active Problem List   Diagnosis Date Noted  . Avascular necrosis of bone of right hip 04/16/2013  . Pre-op evaluation 04/06/2013  . Essential hypertension 04/06/2013  . Dyslipidemia 04/06/2013   Past Medical History  Diagnosis Date  . TIA (transient ischemic attack) 06/2009  . Prostate cancer 2004    seed implant and beam radiation  . HTN (hypertension)   . Hyperlipidemia   . COPD (chronic obstructive pulmonary disease)   . Headache(784.0)     migraine  . Stroke     2010 TIA  . Anxiety     Past Surgical History  Procedure Laterality Date  . Tonsillectomy  1950  . Appendectomy  1952  . Rhinoplasty      Prescriptions prior to admission  Medication Sig Dispense Refill  . aspirin 81 MG tablet Take 81 mg by mouth daily.      Marland Kitchen b complex vitamins capsule Take 1 capsule by mouth 2 (two) times daily.      . celecoxib (CELEBREX) 200 MG  capsule Take 200 mg by mouth 2 (two) times daily.      . cholecalciferol (VITAMIN D) 1000 UNITS tablet Take 1,000 Units by mouth daily.      . clonazePAM (KLONOPIN) 1 MG tablet Take 1 mg by mouth at bedtime as needed.      . fluticasone (FLONASE) 50 MCG/ACT nasal spray Place 2 sprays into the nose daily.      Marland Kitchen gabapentin (NEURONTIN) 600 MG tablet Take 600 mg by mouth 4 (four) times daily.      Marland Kitchen HYDROcodone-acetaminophen (NORCO) 10-325 MG per tablet Take 1 tablet by mouth every 6 (six) hours as needed for pain.      Marland Kitchen losartan (COZAAR) 100 MG tablet Take 100 mg by mouth daily.      Marland Kitchen NASAL SALINE NA Place 2 sprays into both nostrils daily as needed (sinus).      Marland Kitchen omeprazole (PRILOSEC) 20 MG capsule Take 20 mg by mouth 2 (two) times daily.      Marland Kitchen oxymetazoline (AFRIN) 0.05 % nasal spray Place 2 sprays into the nose daily.      . Probiotic Product (PROBIOTIC DAILY PO) Take 1 tablet by mouth 2 (two) times daily.       Marland Kitchen terazosin (HYTRIN) 2 MG capsule Take 2 mg by mouth. 4 tablets at night      . furosemide (LASIX) 20 MG tablet Take 20 mg by mouth daily.  Allergies  Allergen Reactions  . Penicillins     unknown  . Adhesive (Tape) Rash    History  Substance Use Topics  . Smoking status: Former Smoker -- 1.00 packs/day for 25 years    Quit date: 04/05/1998  . Smokeless tobacco: Not on file  . Alcohol Use: 4.2 oz/week    7 Shots of liquor per week     Comment: 6 drinks every day    Family History  Problem Relation Age of Onset  . Cancer Mother     lung and colon  . Stroke Father   . Cancer Maternal Grandfather     lung  . Heart attack Brother   . Hypertension Brother     hyperlipidemia     Review of Systems  All other systems reviewed and are negative.    Objective:  Physical Exam  Constitutional: He is oriented to person, place, and time. He appears well-developed and well-nourished.  HENT:  Head: Normocephalic and atraumatic.  Eyes: EOM are normal. Pupils are  equal, round, and reactive to light.  Neck: Normal range of motion. Neck supple.  Cardiovascular: Normal rate and regular rhythm.   Respiratory: Effort normal.  GI: Soft. Bowel sounds are normal.  Musculoskeletal:       Right hip: He exhibits decreased range of motion, decreased strength and bony tenderness.  Neurological: He is alert and oriented to person, place, and time.  Skin: Skin is warm and dry.  Psychiatric: He has a normal mood and affect.    Vital signs in last 24 hours: Temp:  [98.6 F (37 C)] 98.6 F (37 C) (07/01 1023) Pulse Rate:  [86] 86 (07/01 1023) Resp:  [20] 20 (07/01 1023) BP: (137)/(71) 137/71 mmHg (07/01 1023) SpO2:  [94 %] 94 % (07/01 1023)  Labs:   Estimated body mass index is 26.83 kg/(m^2) as calculated from the following:   Height as of 04/15/13: 5' 7.5" (1.715 m).   Weight as of 04/05/13: 78.926 kg (174 lb).   Imaging Review Plain radiographs demonstrate severe degenerative joint disease of the right hip(s). The bone quality appears to be good for age and reported activity level.  Assessment/Plan:  End stage arthritis, right hip(s)  The patient history, physical examination, clinical judgement of the provider and imaging studies are consistent with end stage degenerative joint disease of the right hip(s) and total hip arthroplasty is deemed medically necessary. The treatment options including medical management, injection therapy, arthroscopy and arthroplasty were discussed at length. The risks and benefits of total hip arthroplasty were presented and reviewed. The risks due to aseptic loosening, infection, stiffness, dislocation/subluxation,  thromboembolic complications and other imponderables were discussed.  The patient acknowledged the explanation, agreed to proceed with the plan and consent was signed. Patient is being admitted for inpatient treatment for surgery, pain control, PT, OT, prophylactic antibiotics, VTE prophylaxis, progressive  ambulation and ADL's and discharge planning.The patient is planning to be discharged home with home health services

## 2013-04-16 NOTE — Brief Op Note (Signed)
04/16/2013  2:15 PM  PATIENT:  Jesse Robbins  72 y.o. male  PRE-OPERATIVE DIAGNOSIS:  Severe avascular necrosis right hip  POST-OPERATIVE DIAGNOSIS:  Severe avascular necrosis right hip  PROCEDURE:  Procedure(s): RIGHT TOTAL HIP ARTHROPLASTY ANTERIOR APPROACH (Right)  SURGEON:  Surgeon(s) and Role:    * Kathryne Hitch, MD - Primary  PHYSICIAN ASSISTANT: Rexene Edison PA-C   ANESTHESIA:   general  EBL:  Total I/O In: 1300 [I.V.:1300] Out: 600 [Blood:600]  BLOOD ADMINISTERED:none  DRAINS: none   LOCAL MEDICATIONS USED:  NONE  SPECIMEN:  No Specimen  DISPOSITION OF SPECIMEN:  N/A  COUNTS:  YES  TOURNIQUET:  * No tourniquets in log *  DICTATION: .Other Dictation: Dictation Number [id number]  PLAN OF CARE: Admit to inpatient   PATIENT DISPOSITION:  PACU - hemodynamically stable.   Delay start of Pharmacological VTE agent (>24hrs) due to surgical blood loss or risk of bleeding: no

## 2013-04-16 NOTE — Preoperative (Signed)
Beta Blockers   Reason not to administer Beta Blockers:Not Applicable 

## 2013-04-16 NOTE — Progress Notes (Signed)
Dr Durene Romans aware of HR >100 in pacu.  No order receiced pt may go to rm.

## 2013-04-16 NOTE — Anesthesia Postprocedure Evaluation (Signed)
  Anesthesia Post-op Note  Patient: Jesse Robbins  Procedure(s) Performed: Procedure(s): RIGHT TOTAL HIP ARTHROPLASTY ANTERIOR APPROACH (Right)  Patient Location: PACU  Anesthesia Type:General  Level of Consciousness: awake  Airway and Oxygen Therapy: Patient Spontanous Breathing  Post-op Pain: mild  Post-op Assessment: Post-op Vital signs reviewed, Patient's Cardiovascular Status Stable, Respiratory Function Stable, Patent Airway, No signs of Nausea or vomiting and Pain level controlled  Post-op Vital Signs: stable  Complications: No apparent anesthesia complications

## 2013-04-16 NOTE — Anesthesia Procedure Notes (Signed)
Procedure Name: Intubation Date/Time: 04/16/2013 12:23 PM Performed by: Sharlene Dory E Pre-anesthesia Checklist: Patient identified, Emergency Drugs available, Suction available, Patient being monitored and Timeout performed Patient Re-evaluated:Patient Re-evaluated prior to inductionOxygen Delivery Method: Circle system utilized Preoxygenation: Pre-oxygenation with 100% oxygen Intubation Type: IV induction Ventilation: Mask ventilation without difficulty and Oral airway inserted - appropriate to patient size Laryngoscope Size: Mac and 3 Grade View: Grade I Tube type: Oral Tube size: 7.5 mm Number of attempts: 1 Airway Equipment and Method: Stylet and LTA kit utilized Placement Confirmation: ETT inserted through vocal cords under direct vision,  positive ETCO2 and breath sounds checked- equal and bilateral Secured at: 22 cm Tube secured with: Tape Dental Injury: Teeth and Oropharynx as per pre-operative assessment

## 2013-04-17 ENCOUNTER — Encounter (HOSPITAL_COMMUNITY): Payer: Self-pay | Admitting: General Practice

## 2013-04-17 LAB — CBC
HCT: 34.5 % — ABNORMAL LOW (ref 39.0–52.0)
MCH: 32.3 pg (ref 26.0–34.0)
MCHC: 34.2 g/dL (ref 30.0–36.0)
MCV: 94.5 fL (ref 78.0–100.0)
Platelets: 213 10*3/uL (ref 150–400)
RDW: 15 % (ref 11.5–15.5)
WBC: 6.6 10*3/uL (ref 4.0–10.5)

## 2013-04-17 LAB — BASIC METABOLIC PANEL
BUN: 12 mg/dL (ref 6–23)
Calcium: 8.5 mg/dL (ref 8.4–10.5)
Creatinine, Ser: 0.93 mg/dL (ref 0.50–1.35)
GFR calc Af Amer: >90 mL/min (ref >90)

## 2013-04-17 MED ORDER — TERAZOSIN HCL 2 MG PO CAPS
8.0000 mg | ORAL_CAPSULE | Freq: Every day | ORAL | Status: DC
  Administered 2013-04-17: 6 mg via ORAL
  Filled 2013-04-17 (×3): qty 1

## 2013-04-17 NOTE — Evaluation (Signed)
Physical Therapy Evaluation Patient Details Name: Jesse Robbins MRN: [id number] DOB: April 04, 1941 Today's Date: 04/17/2013 Time: 4098-1191 PT Time Calculation (min): 21 min  PT Assessment / Plan / Recommendation History of Present Illness  s/p elective R THA; pt is an alcoholic   Clinical Impression  Pt is s/p R THA POD#1 resulting in the deficits listed below (see PT Problem List). 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 Pt will benefit from skilled PT to increase their independence and safety with mobility to allow discharge home with wife and HHPT which is his goal. Discussed with pt benefits of rehab due to decreased mobility, balance and safety awareness but pt is adamantly refusing; will cont to f/u with pt.      PT Assessment  Patient needs continued PT services    Follow Up Recommendations  Home health PT;Supervision/Assistance - 24 hour;Other (comment) (pending progress may need ST-SNF )    Does the patient have the potential to tolerate intense rehabilitation      Barriers to Discharge        Equipment Recommendations  None recommended by PT    Recommendations for Other Services     Frequency 7X/week    Precautions / Restrictions Precautions Precautions: Fall Precaution Comments: MRSA  Restrictions Weight Bearing Restrictions: Yes RLE Weight Bearing: Weight bearing as tolerated   Pertinent Vitals/Pain 3/10; premedicated       Mobility  Bed Mobility Bed Mobility: Supine to Sit Supine to Sit: 4: Min assist;HOB elevated;With rails Details for Bed Mobility Assistance: pt very impulsive and gets frustrated easily with bed moblity; requires verbal cues for sequencing and hand placement and (A) to complete transfer by supporting R LE  Transfers Transfers: Sit to Stand;Stand to Sit Sit to Stand: 4: Min assist;From bed;With upper extremity assist Stand to Sit: 4: Min assist;To chair/3-in-1;With armrests;With upper extremity assist Details for Transfer Assistance:  (A) to safely complete anterior transition to standing position; pt very impulsive and tries to pull up on RW from sitting position; pt demo decreased safety awareness Ambulation/Gait Ambulation/Gait Assistance: 4: Min assist Ambulation Distance (Feet): 10 Feet (x2) Assistive device: Rolling walker Ambulation/Gait Assistance Details: pt tends to push posteriorly and requries contstant cues for safety and upright posture; pt has difficulty managing RW around obstacles and turns; amb with narrow BOS; is very unsteady and demo tremor with gt Gait Pattern: Step-to pattern;Decreased stance time - right;Decreased step length - left;Trunk flexed;Narrow base of support Gait velocity: decreased  Stairs: No Wheelchair Mobility Wheelchair Mobility: No    Exercises Total Joint Exercises Ankle Circles/Pumps: AROM;Both;10 reps;Seated Long Arc Quad: AROM;Both;10 reps;Seated Marching in Standing: Seated;AAROM;Right;10 reps   PT Diagnosis: Difficulty walking;Acute pain  PT Problem List: Decreased strength;Decreased range of motion;Decreased balance;Decreased activity tolerance;Decreased mobility;Decreased knowledge of use of DME;Decreased safety awareness;Pain PT Treatment Interventions: DME instruction;Gait training;Functional mobility training;Stair training;Therapeutic activities;Therapeutic exercise;Balance training;Neuromuscular re-education;Patient/family education     PT Goals(Current goals can be found in the care plan section) Acute Rehab PT Goals Patient Stated Goal: to get home PT Goal Formulation: With patient Time For Goal Achievement: 04/24/13 Potential to Achieve Goals: Good  Visit Information  Last PT Received On: 04/17/13 Assistance Needed: +1 History of Present Illness: s/p elective R THA; pt is an alcoholic        Prior Functioning  Home Living Family/patient expects to be discharged to:: Private residence Living Arrangements: Spouse/significant other Available Help at  Discharge: Family;Available 24 hours/day Type of Home: House Home Access: Stairs to enter  Entrance Stairs-Number of Steps: 2 Entrance Stairs-Rails: None (partial way) Home Layout: Two level;Full bath on main level Alternate Level Stairs-Number of Steps: flight Alternate Level Stairs-Rails: Right (partial way) Home Equipment: Walker - 2 wheels;Walker - standard;Bedside commode;Grab bars - tub/shower Prior Function Level of Independence: Independent Comments: for past 6 weeks has used AD and needed help with socks due to pain Communication Communication: No difficulties Dominant Hand: Right    Cognition  Cognition Arousal/Alertness: Awake/alert Behavior During Therapy: Impulsive Overall Cognitive Status: Within Functional Limits for tasks assessed    Extremity/Trunk Assessment Upper Extremity Assessment Upper Extremity Assessment: Defer to OT evaluation Lower Extremity Assessment Lower Extremity Assessment: RLE deficits/detail RLE Deficits / Details: grossly 3/5 in knee RLE: Unable to fully assess due to pain RLE Sensation:  (WFL to light touch) Cervical / Trunk Assessment Cervical / Trunk Assessment: Normal   Balance Balance Balance Assessed: Yes Static Sitting Balance Static Sitting - Balance Support: Bilateral upper extremity supported;Feet supported Static Sitting - Level of Assistance: 5: Stand by assistance Static Standing Balance Static Standing - Balance Support: Bilateral upper extremity supported;During functional activity Static Standing - Level of Assistance: 4: Min assist  End of Session PT - End of Session Equipment Utilized During Treatment: Gait belt Activity Tolerance: Patient tolerated treatment well Patient left: in chair;with call bell/phone within reach Nurse Communication: Mobility status  GP     Donell Sievert, [place] [phone] 04/17/2013, 9:59 AM

## 2013-04-17 NOTE — Progress Notes (Signed)
Subjective: 1 Day Post-Op Procedure(s) (LRB): RIGHT TOTAL HIP ARTHROPLASTY ANTERIOR APPROACH (Right) Patient reports pain as moderate.  Awake and alert no complaints this AM.  Objective: Vital signs in last 24 hours: Temp:  [97.6 F (36.4 C)-100.1 F (37.8 C)] 98.9 F (37.2 C) (07/02 0505) Pulse Rate:  [86-118] 103 (07/02 0505) Resp:  [10-21] 18 (07/02 0505) BP: (103-154)/(56-78) 103/56 mmHg (07/02 0505) SpO2:  [93 %-100 %] 95 % (07/02 0505)  Intake/Output from previous day: 07/01 0701 - 07/02 0700 In: 1805 [P.O.:230; I.V.:1575] Out: 1225 [Urine:625; Blood:600] Intake/Output this shift:     Recent Labs  04/15/13 1124 04/17/13 0634  HGB 15.4 11.8*    Recent Labs  04/15/13 1124 04/17/13 0634  WBC 6.6 6.6  RBC 4.68 3.65*  HCT 43.3 34.5*  PLT 205 213    Recent Labs  04/15/13 1124 04/17/13 0634  NA 138 135  K 4.1 3.7  CL 97 98  CO2 26 27  BUN 11 12  CREATININE 0.85 0.93  GLUCOSE 80 156*  CALCIUM 10.1 8.5   No results found for this basename: LABPT, INR,  in the last 72 hours  Neurovascular intact Sensation intact distally Intact pulses distally Dorsiflexion/Plantar flexion intact Incision: dressing C/D/I Compartment soft  Assessment/Plan: 1 Day Post-Op Procedure(s) (LRB): RIGHT TOTAL HIP ARTHROPLASTY ANTERIOR APPROACH (Right) Advance diet Up with therapy Teach incentive spirometry  Richardean Canal 04/17/2013, 8:34 AM

## 2013-04-17 NOTE — Op Note (Signed)
NAMEMarland Kitchen  CORGAN, MORMILE NO.:  [id number]  MEDICAL RECORD NO.:  [id number]  LOCATION:  5N02C                        FACILITY:  [facility]  PHYSICIAN:  Vanita Panda. Magnus Ivan, M.D.DATE OF BIRTH:  05-Jan-1941  DATE OF PROCEDURE:  04/16/2013 DATE OF DISCHARGE:                              OPERATIVE REPORT   PREOPERATIVE DIAGNOSIS:  Right hip end-stage avascular necrosis.  POSTOPERATIVE DIAGNOSIS:  Right hip end-stage avascular necrosis.  PROCEDURE:  Right total hip arthroplasty through direct anterior approach.  IMPLANTS:  DePuy Sector Gription acetabular component size 52, size 36+ 4 neutral polyethylene liner, size 13, Corail femoral component with standard offset, size 36+ 1.5 metal hip ball.  SURGEON:  Vanita Panda. Magnus Ivan, MD  ASSISTANT:  Richardean Canal, PA-C  ANESTHESIA:  General.  ANTIBIOTICS:  2 g IV Ancef.  ESTIMATED BLOOD LOSS:  600 mL.  COMPLICATIONS:  None.  INDICATIONS:  Mr. Goodson is 72 year old gentleman who does have the excessive drinking history.  He has developed avascular necrosis of his right hip and has gotten to wear the hip.  Femoral head as had complete collapse and worsened over just a short amount of time.  His most recent x-ray show a complete loss of the joint space and the head is completely collapsed.  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  The risks and benefits of the surgery have been explained to him in detail and he does wish to proceed with surgery.  PROCEDURE DESCRIPTION:  After informed consent was obtained, appropriate right hip was marked.  He was brought to the operating room and general anesthesia was obtained when he was on the stretcher.  Traction boots were placed on his feet and he was next placed supine on the Hana fracture  table.  Perineal post was placed and both feet were in inline skeletal traction devices, but no traction applied.  His right operative hip was then prepped and draped with DuraPrep and sterile drapes.  Time- out was called.  He was identified as correct patient, correct right hip.  I then made an incision just inferior and posterior to the anterior-superior iliac spine and carried this obliquely down the leg. I dissected down to the tensor fascia lata and the tensor fascia was divided longitudinally.  I then found significant hematoma around his hip as well.  We thoroughly irrigated this out, was found no evidence of infection.  I then proceeded with a direct anterior approach to the hip. I cauterized the lateral femoral circumflex vessels and then placed Cobra retractors around the lateral neck and medial neck.  I opened up the joint capsule and found a significant effusion in the joint capsule. We then opened up the joint capsule and placed the Cobra retractors within the joint capsule.  I then made my femoral neck cut just proximal to the lesser trochanter, and carried this down with an oscillating saw, completed with an osteotome.  I then removed the femoral head what was left have been found  to be completely devoid of cartilage and flattened. I cleaned the acetabular debris and placed a Hohmann medially and a Cobra retractor laterally.  I then began reaming from the size 42 reamer in 2 mm increments, although upto size 52 with all reamers placed under direct visualization and a last reamer also placed under direct fluoroscopy to gain my depth of reaming, my inclination, and anteversion.  Once I was pleased with this, I placed the real DePuy Sector Gription acetabular component size 52, and the real 36+ 4 neutral polyethylene liner.  I then turned attention to the femur with the leg externally rotated to about 100 degrees extended and adducted.  We placed a Mueller retractor medially  and Hohmann retractor behind the greater trochanter.  I released the lateral joint capsule and then used a rongeur to lateralize and a box cutting guide to open up the femoral canal.  I began broaching from a size 8 broach and allow all the way to size 13, due to his thin bone likely again from alcohol abuse.  I then placed the trial 13 with a standard neck and a 36+ 1.5 hip ball reduced this and acetabulum was stable and leg lengths were near equal.  I was able to put him to past 90 degrees of external rotation and internal rotation of 45 degrees and he was stable.  I then dislocated the hip again and removed all trial components.  We placed the real Corail femoral component size 13 with HA coating and a collar.  We placed the real 36+ 1.5 metal hip ball, reduced this back in the acetabulum and it was stable.  We used 3 L of normal saline solution and then pulse lavaged to the hip joint, closed the hip capsule with interrupted #1 Ethibond suture followed by running #1 Vicryl in the tensor fascia, 0 Vicryl in the deep tissue, 2-0 Vicryl in subcutaneous tissue, 4-0 Monocryl in subcuticular stitch and Dermabond on the skin.  Aquacel dressing was applied.  He was taken off of the Hana table, awakened, extubated, and taken to the recovery room in stable condition.  All final counts were correct and there were no complications noted.     Vanita Panda. Magnus Ivan, M.D.     CYB/MEDQ  D:  04/16/2013  T:  04/17/2013  Job:  [id number]

## 2013-04-17 NOTE — Progress Notes (Signed)
UR COMPLETED  

## 2013-04-17 NOTE — Progress Notes (Signed)
Physical Therapy Note  Improved performance over initial PT eval, more steady when cued to take things slowly, and benefits from breaking down tasks into smaller sequential steps   04/17/13 1600  PT Visit Information  Last PT Received On 04/17/13  Assistance Needed +1  History of Present Illness s/p elective R THA; pt is an alcoholic   PT Time Calculation  PT Start Time 1349  PT Stop Time 1429  PT Time Calculation (min) 40 min  Subjective Data  Patient Stated Goal to get home  Precautions  Precautions Fall  Precaution Comments MRSA   Restrictions  Weight Bearing Restrictions Yes  RLE Weight Bearing WBAT  Cognition  Arousal/Alertness Awake/alert  Behavior During Therapy Impulsive (requiring cues to take things slowly)  Overall Cognitive Status Within Functional Limits for tasks assessed  Bed Mobility  Bed Mobility Supine to Sit;Sit to Supine  Supine to Sit 4: Min assist;HOB elevated;With rails  Sit to Supine 4: Min guard;HOB flat  Details for Bed Mobility Assistance Required scues to slow down, and to break up task into simple step-by step commands  Transfers  Transfers Sit to Stand;Stand to Sit  Sit to Stand From bed;With upper extremity assist;4: Min assist  Stand to Sit 4: Min assist;To chair/3-in-1;With armrests;With upper extremity assist  Details for Transfer Assistance Continued need for cues for safety, hand placement  Ambulation/Gait  Ambulation/Gait Assistance 4: Min assist  Ambulation Distance (Feet) 30 Feet  Assistive device Rolling walker  Ambulation/Gait Assistance Details Cues for gait sequence, and to take things slowly, have upright posture, and to keep eyes open; Walking better than am session, although pt did have one LOB posteriorly, requiring mod assist to correct  Gait Pattern Step-to pattern;Decreased stance time - right;Decreased step length - left;Trunk flexed;Narrow base of support  Gait velocity decreased   Stairs No  Wheelchair Mobility   Wheelchair Mobility No  Balance  Balance Assessed Yes  Static Sitting Balance  Static Sitting - Balance Support Bilateral upper extremity supported;Feet supported  Static Sitting - Level of Assistance 5: Stand by assistance  Static Standing Balance  Static Standing - Balance Support Bilateral upper extremity supported;During functional activity  Static Standing - Level of Assistance 5: Stand by assistance  Exercises  Exercises Total Joint  Total Joint Exercises  Ankle Circles/Pumps AROM;Both;10 reps;Seated  Quad Sets AROM;Right;10 reps  Gluteal Sets AROM;Both;10 reps  Towel Squeeze AROM;Both;10 reps  Heel Slides AAROM;Right;10 reps  Hip ABduction/ADduction AAROM;Right;10 reps  Bridges AROM;Both;10 reps  PT - End of Session  Equipment Utilized During Treatment Gait belt  Activity Tolerance Patient tolerated treatment well  Patient left with call bell/phone within reach;in bed  Nurse Communication Mobility status  PT - Assessment/Plan  PT Plan Current plan remains appropriate  PT Frequency 7X/week  Follow Up Recommendations Home health PT;Supervision/Assistance - 24 hour  PT equipment Rolling walker with 5" wheels;3in1 (PT)  PT Goal Progression  Progress towards PT goals Progressing toward goals  Acute Rehab PT Goals  PT Goal Formulation With patient  Time For Goal Achievement 04/24/13  Potential to Achieve Goals Good  PT General Charges  $$ ACUTE PT VISIT 1 Procedure  PT Treatments  $Gait Training 8-22 mins  $Therapeutic Exercise 8-22 mins  $Therapeutic Activity 8-22 mins   Pain R HIp 3/10 patient repositioned for comfort Nursing notified  [place], [place] [phone]

## 2013-04-18 LAB — CBC
Hemoglobin: 10.6 g/dL — ABNORMAL LOW (ref 13.0–17.0)
MCHC: 34.1 g/dL (ref 30.0–36.0)
Platelets: 197 10*3/uL (ref 150–400)
RDW: 14.9 % (ref 11.5–15.5)

## 2013-04-18 MED ORDER — METHOCARBAMOL 500 MG PO TABS: 500.0000 mg | ORAL_TABLET | Freq: Four times a day (QID) | ORAL | Status: DC | PRN

## 2013-04-18 MED ORDER — THIAMINE HCL 100 MG PO TABS: 100.0000 mg | ORAL_TABLET | Freq: Every day | ORAL | Status: DC

## 2013-04-18 MED ORDER — OXYCODONE-ACETAMINOPHEN 5-325 MG PO TABS: 1.0000 | ORAL_TABLET | ORAL | Status: DC | PRN

## 2013-04-18 MED ORDER — BISACODYL 10 MG RE SUPP: 10.0000 mg | Freq: Every day | RECTAL | Status: DC | PRN

## 2013-04-18 MED ORDER — ASPIRIN 325 MG PO TBEC: 325.0000 mg | DELAYED_RELEASE_TABLET | Freq: Every day | ORAL | Status: DC

## 2013-04-18 MED ORDER — FERROUS SULFATE 325 (65 FE) MG PO TABS: 325.0000 mg | ORAL_TABLET | Freq: Three times a day (TID) | ORAL | Status: DC

## 2013-04-18 NOTE — Discharge Summary (Signed)
Patient ID: Jesse Robbins MRN: [id number] DOB/AGE: 1941/05/01 72 y.o.  Admit date: 04/16/2013 Discharge date: 04/18/2013  Admission Diagnoses:  Principal Problem:   Avascular necrosis of bone of right hip   Discharge Diagnoses:  Avascular necrosis of bone of right hip Post-op anemia asymptomatic Alcohol abuse   Past Medical History  Diagnosis Date  . TIA (transient ischemic attack) 06/2009  . Prostate cancer 2004    seed implant and beam radiation  . HTN (hypertension)   . Hyperlipidemia   . COPD (chronic obstructive pulmonary disease)   . Headache(784.0)     migraine  . Stroke     2010 TIA  . Anxiety   . Arthritis     Surgeries: Procedure(s): RIGHT TOTAL HIP ARTHROPLASTY ANTERIOR APPROACH on 04/16/2013   Consultants:  PT  Discharged Condition: Improved  Hospital Course: Jesse Robbins is an 72 y.o. male who was admitted 04/16/2013 for operative treatment ofAvascular necrosis of bone of right hip. Patient has severe unremitting pain that affects sleep, daily activities, and work/hobbies. After pre-op clearance the patient was taken to the operating room on 04/16/2013 and underwent  Procedure(s): RIGHT TOTAL HIP ARTHROPLASTY ANTERIOR APPROACH.    Patient was given perioperative antibiotics: Anti-infectives   Start     Dose/Rate Route Frequency Ordered Stop   04/16/13 1700  clindamycin (CLEOCIN) IVPB 600 mg     600 mg 100 mL/hr over 30 Minutes Intravenous Every 6 hours 04/16/13 1619 04/16/13 2322   04/16/13 0600  clindamycin (CLEOCIN) IVPB 900 mg     900 mg 100 mL/hr over 30 Minutes Intravenous On call to O.R. 04/15/13 1423 04/16/13 1228       Patient was given sequential compression devices, early ambulation, and chemoprophylaxis to prevent DVT. Ativan protocol for Alcohol abuse Acute blood loss anemia secondary to surgery iron replacement started. Patient benefited maximally from hospital stay and there were no complications.    Recent vital signs: Patient Vitals for  the past 24 hrs:  BP Temp Pulse Resp SpO2  04/18/13 1200 126/67 mmHg - 96 18 98 %  04/18/13 0800 - - - 18 -  04/18/13 0546 116/60 mmHg 99.2 F (37.3 C) 101 14 98 %  04/18/13 0400 - - 90 - -  04/17/13 2213 118/59 mmHg 97.7 F (36.5 C) 88 16 99 %  04/17/13 2000 - - - 16 99 %  04/17/13 1854 117/58 mmHg 101 F (38.3 C) 105 16 99 %  04/17/13 1506 - - - 18 95 %  04/17/13 1300 108/58 mmHg 99.4 F (37.4 C) 112 18 96 %     Recent laboratory studies:  Recent Labs  04/17/13 0634 04/18/13 0600  WBC 6.6 7.2  HGB 11.8* 10.6*  HCT 34.5* 31.1*  PLT 213 197  NA 135  --   K 3.7  --   CL 98  --   CO2 27  --   BUN 12  --   CREATININE 0.93  --   GLUCOSE 156*  --   CALCIUM 8.5  --      Discharge Medications:     Medication List    STOP taking these medications       aspirin 81 MG tablet  Replaced by:  aspirin 325 MG EC tablet     HYDROcodone-acetaminophen 10-325 MG per tablet  Commonly known as:  NORCO      TAKE these medications       aspirin 325 MG EC tablet  Take 1 tablet (325 mg  total) by mouth daily with breakfast.     b complex vitamins capsule  Take 1 capsule by mouth 2 (two) times daily.     bisacodyl 10 MG suppository  Commonly known as:  DULCOLAX  Place 1 suppository (10 mg total) rectally daily as needed.     celecoxib 200 MG capsule  Commonly known as:  CELEBREX  Take 200 mg by mouth 2 (two) times daily.     cholecalciferol 1000 UNITS tablet  Commonly known as:  VITAMIN D  Take 1,000 Units by mouth daily.     clonazePAM 1 MG tablet  Commonly known as:  KLONOPIN  Take 1 mg by mouth at bedtime as needed.     ferrous sulfate 325 (65 FE) MG tablet  Take 1 tablet (325 mg total) by mouth 3 (three) times daily after meals.     fluticasone 50 MCG/ACT nasal spray  Commonly known as:  FLONASE  Place 2 sprays into the nose daily.     furosemide 20 MG tablet  Commonly known as:  LASIX  Take 20 mg by mouth daily.     gabapentin 600 MG tablet  Commonly  known as:  NEURONTIN  Take 600 mg by mouth 4 (four) times daily.     losartan 100 MG tablet  Commonly known as:  COZAAR  Take 100 mg by mouth daily.     methocarbamol 500 MG tablet  Commonly known as:  ROBAXIN  Take 1 tablet (500 mg total) by mouth every 6 (six) hours as needed.     NASAL SALINE NA  Place 2 sprays into both nostrils daily as needed (sinus).     omeprazole 20 MG capsule  Commonly known as:  PRILOSEC  Take 20 mg by mouth 2 (two) times daily.     oxyCODONE-acetaminophen 5-325 MG per tablet  Commonly known as:  ROXICET  Take 1-2 tablets by mouth every 4 (four) hours as needed for pain.     oxymetazoline 0.05 % nasal spray  Commonly known as:  AFRIN  Place 2 sprays into the nose daily.     PROBIOTIC DAILY PO  Take 1 tablet by mouth 2 (two) times daily.     terazosin 2 MG capsule  Commonly known as:  HYTRIN  Take 2 mg by mouth. 4 tablets at night     thiamine 100 MG tablet  Take 1 tablet (100 mg total) by mouth daily.        Diagnostic Studies: Dg Hip Operative Right  02-May-2013   *RADIOLOGY REPORT*  Clinical Data: Status post right hip prosthesis placement.  DG OPERATIVE RIGHT HIP  Comparison: Right hip MR dated 02/24/2013.  Findings: Two frontal C-arm views of the right hip demonstrate a right total hip prosthesis in satisfactory position and alignment. Also noted are prostate radiation seed implants.  No fracture or dislocation is seen.  IMPRESSION: Satisfactory postoperative appearance of a right total hip prosthesis.   Original Report Authenticated By: Beckie Salts, M.D.   Dg Pelvis Portable  May 02, 2013   *RADIOLOGY REPORT*  Clinical Data: Status post right hip replacement.  PORTABLE PELVIS  Comparison: Hip MR dated 02/24/2013.  Findings: Interval right total hip prosthesis in satisfactory position and alignment.  No fracture or dislocation.  Prostate radiation seed implants.  IMPRESSION: Satisfactory postoperative appearance of a right total hip prosthesis.    Original Report Authenticated By: Beckie Salts, M.D.   Dg Hip Portable 1 View Right  2013-05-02   *RADIOLOGY REPORT*  Clinical Data:  Right hip replacement.  PORTABLE RIGHT HIP - 1 VIEW  Comparison: Hip MR dated 02/24/2013.  Findings: A portable cross-table lateral view of the right hip demonstrates a right total hip prosthesis in satisfactory position and alignment.  No fracture or dislocation is seen.  The superior portions are partially obscured by overlying soft tissue.  IMPRESSION: Satisfactory postoperative appearance of a right total hip prosthesis.   Original Report Authenticated By: Beckie Salts, M.D.    Disposition:       Discharge Orders   Future Appointments Provider Department Dept Phone   05/13/2013 12:00 PM Lewayne Bunting, MD [facility] Main Office [place]) [phone]   Future Orders Complete By Expires     Discharge wound care:  As directed     Comments:      Keep dressing clean dry and intact until Tuesday then remove dressing and shower. Apply clean dressing after showering    Weight bearing as tolerated  As directed     Scheduling Instructions:      Right leg       Follow-up Information   Follow up with Kathryne Hitch, MD. Schedule an appointment as soon as possible for a visit in 2 weeks.   Contact information:   [address] [place] [address] [place] [address] [phone]        Signed: Richardean Canal 04/18/2013, 12:41 PM

## 2013-04-18 NOTE — Progress Notes (Signed)
Subjective: 2 Days Post-Op Procedure(s) (LRB): RIGHT TOTAL HIP ARTHROPLASTY ANTERIOR APPROACH (Right) Patient reports pain as mild.  No complaints. Progressing well with PT. Viding well and positive flatus.  Objective: Vital signs in last 24 hours: Temp:  [97.7 F (36.5 C)-101 F (38.3 C)] 99.2 F (37.3 C) (07/03 0546) Pulse Rate:  [88-112] 96 (07/03 1200) Resp:  [14-18] 18 (07/03 1200) BP: (108-126)/(58-67) 126/67 mmHg (07/03 1200) SpO2:  [95 %-99 %] 98 % (07/03 1200)  Intake/Output from previous day: 07/02 0701 - 07/03 0700 In: 1200 [P.O.:400; I.V.:800] Out: -  Intake/Output this shift:     Recent Labs  04/17/13 0634 04/18/13 0600  HGB 11.8* 10.6*    Recent Labs  04/17/13 0634 04/18/13 0600  WBC 6.6 7.2  RBC 3.65* 3.26*  HCT 34.5* 31.1*  PLT 213 197    Recent Labs  04/17/13 0634  NA 135  K 3.7  CL 98  CO2 27  BUN 12  CREATININE 0.93  GLUCOSE 156*  CALCIUM 8.5   No results found for this basename: LABPT, INR,  in the last 72 hours  Neurologically intact Sensation intact distally Intact pulses distally Dorsiflexion/Plantar flexion intact Incision: dressing C/D/I Compartment soft  Assessment/Plan: 2 Days Post-Op Procedure(s) (LRB): RIGHT TOTAL HIP ARTHROPLASTY ANTERIOR APPROACH (Right) Up with therapy this afternoon if does well may d/c to home this afternoon. Follow up with Dr. Magnus Ivan in 2weeks post-op OTC stool softener as needed.  Richardean Canal 04/18/2013, 12:23 PM

## 2013-04-18 NOTE — Progress Notes (Signed)
Physical Therapy Treatment Patient Details Name: KAEDYN POLIVKA MRN: [id number] DOB: 09/03/1941 Today's Date: 04/18/2013 Time: 2130-8657 PT Time Calculation (min): 16 min  PT Assessment / Plan / Recommendation  PT Comments   Patient practiced steps with wife present. Still requiring supervision for safety. Planning to DC today  Follow Up Recommendations  Home health PT;Supervision/Assistance - 24 hour     Does the patient have the potential to tolerate intense rehabilitation     Barriers to Discharge        Equipment Recommendations  Rolling walker with 5" wheels;3in1 (PT)    Recommendations for Other Services    Frequency 7X/week   Progress towards PT Goals Progress towards PT goals: Progressing toward goals  Plan Current plan remains appropriate    Precautions / Restrictions Precautions Precautions: Fall Precaution Comments: MRSA  Restrictions RLE Weight Bearing: Weight bearing as tolerated   Pertinent Vitals/Pain no apparent distress     Mobility  Bed Mobility Supine to Sit: 5: Supervision;With rails Transfers Sit to Stand: 5: Supervision;With upper extremity assist;From bed Stand to Sit: 5: Supervision;With upper extremity assist;To chair/3-in-1 Details for Transfer Assistance: Supervision for safety Ambulation/Gait Ambulation/Gait Assistance: 4: Min guard Ambulation Distance (Feet): 250 Feet Assistive device: Rolling walker Ambulation/Gait Assistance Details: MinGuard required for safety as patient still shakey Gait Pattern: Decreased stance time - right;Decreased step length - left;Trunk flexed;Step-through pattern Stairs: Yes Stairs Assistance: 4: Min assist Stairs Assistance Details (indicate cue type and reason): wife instructed on stair training Stair Management Technique: No rails;Step to pattern;Backwards;With walker Number of Stairs: 4    Exercises Total Joint Exercises Heel Slides: AAROM;Right;10 reps Hip ABduction/ADduction: AAROM;Right;10  reps Long Arc Quad: AROM;Both;10 reps;Seated   PT Diagnosis:    PT Problem List:   PT Treatment Interventions:     PT Goals (current goals can now be found in the care plan section)    Visit Information  Last PT Received On: 04/18/13 Assistance Needed: +1 History of Present Illness: s/p elective R THA; pt is an alcoholic     Subjective Data      Cognition  Cognition Arousal/Alertness: Awake/alert Behavior During Therapy: WFL for tasks assessed/performed Overall Cognitive Status: Within Functional Limits for tasks assessed    Balance     End of Session PT - End of Session Equipment Utilized During Treatment: Gait belt Activity Tolerance: Patient tolerated treatment well Patient left: with call bell/phone within reach;in bed Nurse Communication: Mobility status   GP     Fredrich Birks 04/18/2013, 1:47 PM  04/18/2013 Fredrich Birks PTA [phone] pager [phone] office

## 2013-04-18 NOTE — Progress Notes (Signed)
Physical Therapy Treatment Patient Details Name: Jesse Robbins MRN: [id number] DOB: 1940-12-14 Today's Date: 04/18/2013 Time: 0930-0950 PT Time Calculation (min): 20 min  PT Assessment / Plan / Recommendation  PT Comments   Patient progressing with safety and overall mobility today. Patient is making good progress with PT.  From a mobility standpoint anticipate patient will be ready for DC home today if cleared by MD .  Will practice steps with wife this afternoon   Follow Up Recommendations  Home health PT;Supervision/Assistance - 24 hour     Does the patient have the potential to tolerate intense rehabilitation     Barriers to Discharge        Equipment Recommendations  Rolling walker with 5" wheels;3in1 (PT)    Recommendations for Other Services    Frequency     Progress towards PT Goals Progress towards PT goals: Progressing toward goals  Plan Current plan remains appropriate    Precautions / Restrictions Precautions Precautions: Fall Precaution Comments: MRSA  Restrictions Weight Bearing Restrictions: Yes RLE Weight Bearing: Weight bearing as tolerated   Pertinent Vitals/Pain no apparent distress     Mobility  Bed Mobility Bed Mobility: Sit to Supine Supine to Sit: 5: Supervision;With rails Sit to Supine: 6: Modified independent (Device/Increase time);HOB flat Details for Bed Mobility Assistance: extra time to get R LE up in bed Transfers Sit to Stand: 5: Supervision;With upper extremity assist;From bed Stand to Sit: 5: Supervision;With upper extremity assist;To chair/3-in-1 Details for Transfer Assistance: Continued need for cues for safety, hand placement Ambulation/Gait Ambulation/Gait Assistance: 4: Min guard Ambulation Distance (Feet): 90 Feet Assistive device: Rolling walker Ambulation/Gait Assistance Details: Cues for upright posture and RW safety. No LOB with ambulation this session. Progressing well. Cued to take his time to increase safety Gait  Pattern: Decreased stance time - right;Decreased step length - left;Trunk flexed;Step-through pattern Stairs: Yes Stairs Assistance: 4: Min assist Stairs Assistance Details (indicate cue type and reason): A for RW and for balance as patient continues to be slighly shakey.  Stair Management Technique: No rails;Step to pattern;Backwards;With walker Number of Stairs: 4    Exercises Total Joint Exercises Heel Slides: AAROM;Right;10 reps Hip ABduction/ADduction: AAROM;Right;10 reps Long Arc Quad: AROM;Both;10 reps;Seated   PT Diagnosis:    PT Problem List:   PT Treatment Interventions:     PT Goals (current goals can now be found in the care plan section) Acute Rehab PT Goals Patient Stated Goal: to get home  Visit Information  Last PT Received On: 04/18/13 Assistance Needed: +1 History of Present Illness: s/p elective R THA; pt is an alcoholic     Subjective Data  Patient Stated Goal: to get home   Cognition  Cognition Arousal/Alertness: Awake/alert Behavior During Therapy: WFL for tasks assessed/performed Overall Cognitive Status: Within Functional Limits for tasks assessed    Balance     End of Session PT - End of Session Equipment Utilized During Treatment: Gait belt Activity Tolerance: Patient tolerated treatment well Patient left: with call bell/phone within reach;in bed Nurse Communication: Mobility status   GP     Fredrich Birks 04/18/2013, 10:33 AM 04/18/2013 Fredrich Birks PTA [phone] pager [phone] office

## 2013-04-18 NOTE — Evaluation (Signed)
Occupational Therapy Evaluation Patient Details Name: Jesse Robbins MRN: [id number] DOB: 08-21-1941 Today's Date: 04/18/2013 Time: 4098-1191 OT Time Calculation (min): 22 min  OT Assessment / Plan / Recommendation History of present illness s/p elective R THA; pt is an alcoholic    Clinical Impression   All education completed with pt and wife.  Pt requires supervision for mobility and min assist for ADL.  Wife will be providing 24 hour care.  No interest in AE.  Will secure DME for tub through friends.    OT Assessment  Patient does not need any further OT services    Follow Up Recommendations  No OT follow up;Supervision/Assistance - 24 hour    Barriers to Discharge      Equipment Recommendations  None recommended by OT    Recommendations for Other Services    Frequency       Precautions / Restrictions Precautions Precautions: Fall Precaution Comments: MRSA  Restrictions Weight Bearing Restrictions: Yes RLE Weight Bearing: Weight bearing as tolerated   Pertinent Vitals/Pain 3/10, R hip, premedicated, repositioned    ADL  Eating/Feeding: Independent Where Assessed - Eating/Feeding: Edge of bed Grooming: Supervision/safety Where Assessed - Grooming: Unsupported standing Upper Body Bathing: Set up Where Assessed - Upper Body Bathing: Unsupported sitting Lower Body Bathing: Minimal assistance Where Assessed - Lower Body Bathing: Unsupported sitting;Supported sit to stand Upper Body Dressing: Set up Where Assessed - Upper Body Dressing: Unsupported sitting Lower Body Dressing: Minimal assistance Where Assessed - Lower Body Dressing: Unsupported sitting;Supported sit to stand Toilet Transfer: Supervision/safety Toilet Transfer Method: Sit to Barista: Comfort height toilet;Grab bars Toileting - Architect and Hygiene: Supervision/safety Where Assessed - Engineer, mining and Hygiene: Sit to stand from 3-in-1 or  toilet Tub/Shower Transfer: Supervision/safety Tub/Shower Transfer Method: Science writer: Counsellor Used: Rolling walker;Reacher;Sock aid;Long-handled shoe horn;Long-handled sponge (tub bench) Transfers/Ambulation Related to ADLs: supervision with RW, verbal cues for hand placement ADL Comments: Pt is nearly able to reach his R foot for bathing and dressing.  Not interested in AE, will rely on wife.  Demonstrated use of tub bench.  Pt  may have access to a bench through church.  Educated on safest shoes.    OT Diagnosis:    OT Problem List:   OT Treatment Interventions:     OT Goals(Current goals can be found in the care plan section) Acute Rehab OT Goals Patient Stated Goal: to get home  Visit Information  Last OT Received On: 04/18/13 Assistance Needed: +1 History of Present Illness: s/p elective R THA; pt is an alcoholic        Prior Functioning     Home Living Family/patient expects to be discharged to:: Private residence Living Arrangements: Spouse/significant other Available Help at Discharge: Family;Available 24 hours/day Type of Home: House Home Access: Stairs to enter [employer] of Steps: 2 Entrance Stairs-Rails: None Home Layout: Two level;Full bath on main level Alternate Level Stairs-Number of Steps: flight Alternate Level Stairs-Rails: Right Home Equipment: Walker - 2 wheels;Walker - standard;Bedside commode;Grab bars - tub/shower Prior Function Level of Independence: Independent Comments: for past 6 weeks has used AD and needed help with socks due to pain Communication Communication: No difficulties Dominant Hand: Right         Vision/Perception     Cognition  Cognition Arousal/Alertness: Awake/alert Behavior During Therapy: WFL for tasks assessed/performed Overall Cognitive Status: Within Functional Limits for tasks assessed    Extremity/Trunk Assessment Upper Extremity Assessment  Upper  Extremity Assessment: Overall WFL for tasks assessed Cervical / Trunk Assessment Cervical / Trunk Assessment: Normal     Mobility Bed Mobility Bed Mobility: Sit to Supine Sit to Supine: 6: Modified independent (Device/Increase time);HOB flat Details for Bed Mobility Assistance: extra time to get R LE up in bed Transfers Transfers: Sit to Stand;Stand to Sit Sit to Stand: 5: Supervision;With upper extremity assist;From chair/3-in-1 Stand to Sit: 5: Supervision;With upper extremity assist;To bed Details for Transfer Assistance: Continued need for cues for safety, hand placement     Exercise     Balance     End of Session OT - End of Session Activity Tolerance: Patient tolerated treatment well Patient left: in bed;in CPM;with family/visitor present  GO     Evern Bio 04/18/2013, 9:16 AM [phone]

## 2013-05-10 ENCOUNTER — Inpatient Hospital Stay: Admit: 2013-05-10 | Payer: Medicare Other | Admitting: Orthopaedic Surgery

## 2013-05-10 SURGERY — ARTHROPLASTY, HIP, TOTAL, ANTERIOR APPROACH
Anesthesia: Choice | Site: Hip | Laterality: Right

## 2013-05-13 ENCOUNTER — Ambulatory Visit: Payer: Medicare Other | Admitting: Cardiology

## 2013-05-22 ENCOUNTER — Other Ambulatory Visit: Payer: Self-pay

## 2013-08-21 DIAGNOSIS — M48061 Spinal stenosis, lumbar region without neurogenic claudication: Secondary | ICD-10-CM | POA: Insufficient documentation

## 2013-08-21 DIAGNOSIS — M4120 Other idiopathic scoliosis, site unspecified: Secondary | ICD-10-CM | POA: Insufficient documentation

## 2013-08-22 ENCOUNTER — Other Ambulatory Visit: Payer: Self-pay

## 2013-09-03 ENCOUNTER — Other Ambulatory Visit: Payer: Self-pay | Admitting: Orthopedic Surgery

## 2013-09-03 ENCOUNTER — Ambulatory Visit
Admission: RE | Admit: 2013-09-03 | Discharge: 2013-09-03 | Disposition: A | Discharge status: Home / Self Care | Payer: Medicare Other | Source: Ambulatory Visit | Attending: Orthopedic Surgery | Admitting: Orthopedic Surgery

## 2013-09-03 DIAGNOSIS — M419 Scoliosis, unspecified: Secondary | ICD-10-CM

## 2013-09-03 DIAGNOSIS — M4800 Spinal stenosis, site unspecified: Secondary | ICD-10-CM

## 2013-11-05 ENCOUNTER — Encounter: Payer: Self-pay | Admitting: Cardiology

## 2013-12-05 DIAGNOSIS — Z8546 Personal history of malignant neoplasm of prostate: Secondary | ICD-10-CM | POA: Insufficient documentation

## 2013-12-05 DIAGNOSIS — J449 Chronic obstructive pulmonary disease, unspecified: Secondary | ICD-10-CM | POA: Insufficient documentation

## 2013-12-05 DIAGNOSIS — K219 Gastro-esophageal reflux disease without esophagitis: Secondary | ICD-10-CM | POA: Insufficient documentation

## 2013-12-05 DIAGNOSIS — Z8673 Personal history of transient ischemic attack (TIA), and cerebral infarction without residual deficits: Secondary | ICD-10-CM | POA: Insufficient documentation

## 2013-12-05 DIAGNOSIS — F1020 Alcohol dependence, uncomplicated: Secondary | ICD-10-CM | POA: Insufficient documentation

## 2013-12-05 DIAGNOSIS — Z9889 Other specified postprocedural states: Secondary | ICD-10-CM | POA: Insufficient documentation

## 2014-01-30 ENCOUNTER — Ambulatory Visit
Admission: RE | Admit: 2014-01-30 | Discharge: 2014-01-30 | Disposition: A | Discharge status: Home / Self Care | Payer: Medicare Other | Source: Ambulatory Visit | Attending: Family Medicine | Admitting: Family Medicine

## 2014-01-30 ENCOUNTER — Other Ambulatory Visit: Payer: Self-pay | Admitting: Family Medicine

## 2014-01-30 DIAGNOSIS — M25571 Pain in right ankle and joints of right foot: Secondary | ICD-10-CM

## 2014-06-30 ENCOUNTER — Other Ambulatory Visit: Payer: Self-pay | Admitting: Family Medicine

## 2014-06-30 ENCOUNTER — Ambulatory Visit
Admission: RE | Admit: 2014-06-30 | Discharge: 2014-06-30 | Disposition: A | Discharge status: Home / Self Care | Payer: Medicare Other | Source: Ambulatory Visit | Attending: Family Medicine | Admitting: Family Medicine

## 2014-06-30 DIAGNOSIS — M7541 Impingement syndrome of right shoulder: Secondary | ICD-10-CM

## 2014-12-24 ENCOUNTER — Ambulatory Visit (INDEPENDENT_AMBULATORY_CARE_PROVIDER_SITE_OTHER): Payer: Medicare Other | Admitting: Psychology

## 2014-12-24 DIAGNOSIS — F332 Major depressive disorder, recurrent severe without psychotic features: Secondary | ICD-10-CM | POA: Diagnosis not present

## 2014-12-24 DIAGNOSIS — F1020 Alcohol dependence, uncomplicated: Secondary | ICD-10-CM

## 2014-12-31 ENCOUNTER — Ambulatory Visit (INDEPENDENT_AMBULATORY_CARE_PROVIDER_SITE_OTHER): Payer: Medicare Other | Admitting: Psychology

## 2014-12-31 DIAGNOSIS — F332 Major depressive disorder, recurrent severe without psychotic features: Secondary | ICD-10-CM | POA: Diagnosis not present

## 2014-12-31 DIAGNOSIS — F1020 Alcohol dependence, uncomplicated: Secondary | ICD-10-CM | POA: Diagnosis not present

## 2015-01-01 ENCOUNTER — Ambulatory Visit (INDEPENDENT_AMBULATORY_CARE_PROVIDER_SITE_OTHER): Payer: Medicare Other | Admitting: Psychology

## 2015-01-01 DIAGNOSIS — F1020 Alcohol dependence, uncomplicated: Secondary | ICD-10-CM

## 2015-01-01 DIAGNOSIS — F332 Major depressive disorder, recurrent severe without psychotic features: Secondary | ICD-10-CM

## 2015-01-05 ENCOUNTER — Ambulatory Visit (INDEPENDENT_AMBULATORY_CARE_PROVIDER_SITE_OTHER): Payer: Medicare Other | Admitting: Psychology

## 2015-01-05 DIAGNOSIS — F332 Major depressive disorder, recurrent severe without psychotic features: Secondary | ICD-10-CM

## 2015-01-05 DIAGNOSIS — F1020 Alcohol dependence, uncomplicated: Secondary | ICD-10-CM | POA: Diagnosis not present

## 2015-01-16 ENCOUNTER — Ambulatory Visit (INDEPENDENT_AMBULATORY_CARE_PROVIDER_SITE_OTHER): Payer: Medicare Other | Admitting: Psychology

## 2015-01-16 ENCOUNTER — Ambulatory Visit: Payer: Medicare Other | Admitting: Psychology

## 2015-01-16 DIAGNOSIS — F1020 Alcohol dependence, uncomplicated: Secondary | ICD-10-CM | POA: Diagnosis not present

## 2015-01-16 DIAGNOSIS — F332 Major depressive disorder, recurrent severe without psychotic features: Secondary | ICD-10-CM

## 2015-01-26 ENCOUNTER — Ambulatory Visit (INDEPENDENT_AMBULATORY_CARE_PROVIDER_SITE_OTHER): Payer: Medicare Other | Admitting: Psychology

## 2015-01-26 DIAGNOSIS — F332 Major depressive disorder, recurrent severe without psychotic features: Secondary | ICD-10-CM

## 2015-01-26 DIAGNOSIS — F1020 Alcohol dependence, uncomplicated: Secondary | ICD-10-CM

## 2015-02-13 ENCOUNTER — Ambulatory Visit (INDEPENDENT_AMBULATORY_CARE_PROVIDER_SITE_OTHER): Payer: Medicare Other | Admitting: Psychology

## 2015-02-13 DIAGNOSIS — F332 Major depressive disorder, recurrent severe without psychotic features: Secondary | ICD-10-CM

## 2015-02-13 DIAGNOSIS — F1020 Alcohol dependence, uncomplicated: Secondary | ICD-10-CM | POA: Diagnosis not present

## 2015-02-18 ENCOUNTER — Ambulatory Visit (INDEPENDENT_AMBULATORY_CARE_PROVIDER_SITE_OTHER): Payer: Medicare Other | Admitting: Psychology

## 2015-02-18 DIAGNOSIS — F332 Major depressive disorder, recurrent severe without psychotic features: Secondary | ICD-10-CM | POA: Diagnosis not present

## 2015-02-18 DIAGNOSIS — F1020 Alcohol dependence, uncomplicated: Secondary | ICD-10-CM

## 2015-02-26 ENCOUNTER — Ambulatory Visit: Payer: Medicare Other | Admitting: Psychology

## 2015-03-11 ENCOUNTER — Ambulatory Visit: Payer: Medicare Other | Admitting: Psychology

## 2015-03-18 ENCOUNTER — Ambulatory Visit (INDEPENDENT_AMBULATORY_CARE_PROVIDER_SITE_OTHER): Payer: Medicare Other | Admitting: Psychology

## 2015-03-18 DIAGNOSIS — F332 Major depressive disorder, recurrent severe without psychotic features: Secondary | ICD-10-CM | POA: Diagnosis not present

## 2015-03-18 DIAGNOSIS — F1020 Alcohol dependence, uncomplicated: Secondary | ICD-10-CM

## 2015-04-03 ENCOUNTER — Ambulatory Visit (INDEPENDENT_AMBULATORY_CARE_PROVIDER_SITE_OTHER): Payer: Medicare Other | Admitting: Psychology

## 2015-04-03 DIAGNOSIS — F332 Major depressive disorder, recurrent severe without psychotic features: Secondary | ICD-10-CM

## 2015-04-03 DIAGNOSIS — F1020 Alcohol dependence, uncomplicated: Secondary | ICD-10-CM | POA: Diagnosis not present

## 2015-04-10 ENCOUNTER — Ambulatory Visit: Payer: Medicare Other | Admitting: Psychology

## 2015-04-16 ENCOUNTER — Ambulatory Visit (INDEPENDENT_AMBULATORY_CARE_PROVIDER_SITE_OTHER): Payer: Medicare Other | Admitting: Psychology

## 2015-04-16 DIAGNOSIS — F1020 Alcohol dependence, uncomplicated: Secondary | ICD-10-CM

## 2015-04-16 DIAGNOSIS — F332 Major depressive disorder, recurrent severe without psychotic features: Secondary | ICD-10-CM

## 2015-05-14 ENCOUNTER — Ambulatory Visit (INDEPENDENT_AMBULATORY_CARE_PROVIDER_SITE_OTHER): Payer: Medicare Other | Admitting: Psychology

## 2015-05-14 DIAGNOSIS — F332 Major depressive disorder, recurrent severe without psychotic features: Secondary | ICD-10-CM | POA: Diagnosis not present

## 2015-05-14 DIAGNOSIS — F1020 Alcohol dependence, uncomplicated: Secondary | ICD-10-CM | POA: Diagnosis not present

## 2015-05-28 ENCOUNTER — Ambulatory Visit (INDEPENDENT_AMBULATORY_CARE_PROVIDER_SITE_OTHER): Payer: Medicare Other | Admitting: Psychology

## 2015-05-28 DIAGNOSIS — F332 Major depressive disorder, recurrent severe without psychotic features: Secondary | ICD-10-CM | POA: Diagnosis not present

## 2015-06-15 ENCOUNTER — Ambulatory Visit (INDEPENDENT_AMBULATORY_CARE_PROVIDER_SITE_OTHER): Payer: Medicare Other | Admitting: Psychology

## 2015-06-15 DIAGNOSIS — F1020 Alcohol dependence, uncomplicated: Secondary | ICD-10-CM | POA: Diagnosis not present

## 2015-06-15 DIAGNOSIS — F332 Major depressive disorder, recurrent severe without psychotic features: Secondary | ICD-10-CM

## 2015-06-29 ENCOUNTER — Ambulatory Visit (INDEPENDENT_AMBULATORY_CARE_PROVIDER_SITE_OTHER): Payer: Medicare Other | Admitting: Psychology

## 2015-06-29 DIAGNOSIS — F332 Major depressive disorder, recurrent severe without psychotic features: Secondary | ICD-10-CM

## 2015-06-29 DIAGNOSIS — F1020 Alcohol dependence, uncomplicated: Secondary | ICD-10-CM | POA: Diagnosis not present

## 2015-07-07 ENCOUNTER — Other Ambulatory Visit: Payer: Self-pay | Admitting: Family Medicine

## 2015-07-07 DIAGNOSIS — M5416 Radiculopathy, lumbar region: Secondary | ICD-10-CM

## 2015-07-17 ENCOUNTER — Ambulatory Visit (INDEPENDENT_AMBULATORY_CARE_PROVIDER_SITE_OTHER): Payer: Medicare Other | Admitting: Psychology

## 2015-07-17 DIAGNOSIS — F332 Major depressive disorder, recurrent severe without psychotic features: Secondary | ICD-10-CM

## 2015-07-17 DIAGNOSIS — F1020 Alcohol dependence, uncomplicated: Secondary | ICD-10-CM

## 2015-07-19 ENCOUNTER — Ambulatory Visit
Admission: RE | Admit: 2015-07-19 | Discharge: 2015-07-19 | Disposition: A | Discharge status: Home / Self Care | Payer: Medicare Other | Source: Ambulatory Visit | Attending: Family Medicine | Admitting: Family Medicine

## 2015-07-19 DIAGNOSIS — M5416 Radiculopathy, lumbar region: Secondary | ICD-10-CM

## 2015-08-06 ENCOUNTER — Ambulatory Visit (INDEPENDENT_AMBULATORY_CARE_PROVIDER_SITE_OTHER): Payer: Medicare Other | Admitting: Psychology

## 2015-08-06 DIAGNOSIS — F332 Major depressive disorder, recurrent severe without psychotic features: Secondary | ICD-10-CM

## 2015-08-06 DIAGNOSIS — F1020 Alcohol dependence, uncomplicated: Secondary | ICD-10-CM

## 2015-08-10 ENCOUNTER — Other Ambulatory Visit: Payer: Self-pay | Admitting: Family Medicine

## 2015-08-10 DIAGNOSIS — M545 Low back pain, unspecified: Secondary | ICD-10-CM

## 2015-08-10 DIAGNOSIS — G8929 Other chronic pain: Secondary | ICD-10-CM

## 2015-08-11 ENCOUNTER — Other Ambulatory Visit: Payer: Self-pay | Admitting: Family Medicine

## 2015-08-11 ENCOUNTER — Ambulatory Visit
Admission: RE | Admit: 2015-08-11 | Discharge: 2015-08-11 | Disposition: A | Discharge status: Home / Self Care | Payer: Medicare Other | Source: Ambulatory Visit | Attending: Family Medicine | Admitting: Family Medicine

## 2015-08-11 DIAGNOSIS — M545 Low back pain, unspecified: Secondary | ICD-10-CM

## 2015-08-11 DIAGNOSIS — G8929 Other chronic pain: Secondary | ICD-10-CM

## 2015-08-11 MED ORDER — IOHEXOL 180 MG/ML  SOLN
1.0000 mL | Freq: Once | INTRAMUSCULAR | Status: DC | PRN
  Administered 2015-08-11: 1 mL via EPIDURAL

## 2015-08-11 MED ORDER — METHYLPREDNISOLONE ACETATE 40 MG/ML INJ SUSP (RADIOLOG
120.0000 mg | Freq: Once | INTRAMUSCULAR | Status: AC
  Administered 2015-08-11: 120 mg via EPIDURAL

## 2015-08-11 NOTE — Discharge Instructions (Signed)

## 2015-08-17 ENCOUNTER — Ambulatory Visit (INDEPENDENT_AMBULATORY_CARE_PROVIDER_SITE_OTHER): Payer: Medicare Other | Admitting: Psychology

## 2015-08-17 DIAGNOSIS — F1020 Alcohol dependence, uncomplicated: Secondary | ICD-10-CM

## 2015-08-17 DIAGNOSIS — F332 Major depressive disorder, recurrent severe without psychotic features: Secondary | ICD-10-CM

## 2015-08-31 ENCOUNTER — Ambulatory Visit (INDEPENDENT_AMBULATORY_CARE_PROVIDER_SITE_OTHER): Payer: Medicare Other | Admitting: Psychology

## 2015-08-31 DIAGNOSIS — F332 Major depressive disorder, recurrent severe without psychotic features: Secondary | ICD-10-CM | POA: Diagnosis not present

## 2015-08-31 DIAGNOSIS — F1020 Alcohol dependence, uncomplicated: Secondary | ICD-10-CM

## 2015-09-16 ENCOUNTER — Ambulatory Visit (INDEPENDENT_AMBULATORY_CARE_PROVIDER_SITE_OTHER): Payer: Medicare Other | Admitting: Psychology

## 2015-09-16 DIAGNOSIS — F332 Major depressive disorder, recurrent severe without psychotic features: Secondary | ICD-10-CM | POA: Diagnosis not present

## 2015-09-16 DIAGNOSIS — F1020 Alcohol dependence, uncomplicated: Secondary | ICD-10-CM

## 2015-09-30 ENCOUNTER — Ambulatory Visit (INDEPENDENT_AMBULATORY_CARE_PROVIDER_SITE_OTHER): Payer: Medicare Other | Admitting: Psychology

## 2015-09-30 DIAGNOSIS — F1020 Alcohol dependence, uncomplicated: Secondary | ICD-10-CM

## 2015-09-30 DIAGNOSIS — F332 Major depressive disorder, recurrent severe without psychotic features: Secondary | ICD-10-CM

## 2015-10-01 ENCOUNTER — Ambulatory Visit: Payer: Medicare Other | Admitting: Psychology

## 2015-10-15 ENCOUNTER — Ambulatory Visit: Payer: Medicare Other | Admitting: Psychology

## 2015-11-04 ENCOUNTER — Ambulatory Visit (INDEPENDENT_AMBULATORY_CARE_PROVIDER_SITE_OTHER): Payer: Medicare Other | Admitting: Psychology

## 2015-11-04 DIAGNOSIS — F332 Major depressive disorder, recurrent severe without psychotic features: Secondary | ICD-10-CM

## 2015-11-04 DIAGNOSIS — F1020 Alcohol dependence, uncomplicated: Secondary | ICD-10-CM | POA: Diagnosis not present

## 2015-11-18 ENCOUNTER — Ambulatory Visit: Payer: Medicare Other | Admitting: Psychology

## 2015-12-30 ENCOUNTER — Ambulatory Visit (INDEPENDENT_AMBULATORY_CARE_PROVIDER_SITE_OTHER): Payer: Medicare Other | Admitting: Psychology

## 2015-12-30 DIAGNOSIS — F1029 Alcohol dependence with unspecified alcohol-induced disorder: Secondary | ICD-10-CM

## 2015-12-30 DIAGNOSIS — F322 Major depressive disorder, single episode, severe without psychotic features: Secondary | ICD-10-CM

## 2016-01-13 ENCOUNTER — Ambulatory Visit (INDEPENDENT_AMBULATORY_CARE_PROVIDER_SITE_OTHER): Payer: Medicare Other | Admitting: Psychology

## 2016-01-13 DIAGNOSIS — F332 Major depressive disorder, recurrent severe without psychotic features: Secondary | ICD-10-CM

## 2016-01-13 DIAGNOSIS — F1020 Alcohol dependence, uncomplicated: Secondary | ICD-10-CM

## 2016-02-01 ENCOUNTER — Ambulatory Visit (INDEPENDENT_AMBULATORY_CARE_PROVIDER_SITE_OTHER): Payer: Medicare Other | Admitting: Psychology

## 2016-02-01 DIAGNOSIS — F1020 Alcohol dependence, uncomplicated: Secondary | ICD-10-CM | POA: Diagnosis not present

## 2016-02-01 DIAGNOSIS — F332 Major depressive disorder, recurrent severe without psychotic features: Secondary | ICD-10-CM | POA: Diagnosis not present

## 2016-02-15 ENCOUNTER — Ambulatory Visit (INDEPENDENT_AMBULATORY_CARE_PROVIDER_SITE_OTHER): Payer: Medicare Other | Admitting: Psychology

## 2016-02-15 DIAGNOSIS — F1020 Alcohol dependence, uncomplicated: Secondary | ICD-10-CM

## 2016-02-15 DIAGNOSIS — F332 Major depressive disorder, recurrent severe without psychotic features: Secondary | ICD-10-CM | POA: Diagnosis not present

## 2016-03-17 ENCOUNTER — Other Ambulatory Visit: Payer: Self-pay | Admitting: Family Medicine

## 2016-03-17 DIAGNOSIS — M79606 Pain in leg, unspecified: Secondary | ICD-10-CM

## 2016-03-21 ENCOUNTER — Ambulatory Visit (INDEPENDENT_AMBULATORY_CARE_PROVIDER_SITE_OTHER): Payer: Medicare Other | Admitting: Psychology

## 2016-03-21 DIAGNOSIS — F1020 Alcohol dependence, uncomplicated: Secondary | ICD-10-CM | POA: Diagnosis not present

## 2016-03-21 DIAGNOSIS — F332 Major depressive disorder, recurrent severe without psychotic features: Secondary | ICD-10-CM

## 2016-03-25 ENCOUNTER — Other Ambulatory Visit: Payer: Self-pay | Admitting: Radiology

## 2016-03-25 ENCOUNTER — Encounter: Payer: Self-pay | Admitting: Radiology

## 2016-03-25 ENCOUNTER — Ambulatory Visit
Admission: RE | Admit: 2016-03-25 | Discharge: 2016-03-25 | Disposition: A | Discharge status: Home / Self Care | Payer: Medicare Other | Source: Ambulatory Visit | Attending: Family Medicine | Admitting: Family Medicine

## 2016-03-25 DIAGNOSIS — M79606 Pain in leg, unspecified: Secondary | ICD-10-CM

## 2016-03-25 MED ORDER — IOPAMIDOL (ISOVUE-M 200) INJECTION 41%
1.0000 mL | Freq: Once | INTRAMUSCULAR | Status: AC
  Administered 2016-03-25: 1 mL via EPIDURAL

## 2016-03-25 MED ORDER — METHYLPREDNISOLONE ACETATE 40 MG/ML INJ SUSP (RADIOLOG
120.0000 mg | Freq: Once | INTRAMUSCULAR | Status: AC
  Administered 2016-03-25: 120 mg via EPIDURAL

## 2016-03-25 NOTE — Discharge Instructions (Signed)

## 2016-04-04 ENCOUNTER — Ambulatory Visit (INDEPENDENT_AMBULATORY_CARE_PROVIDER_SITE_OTHER): Payer: Medicare Other | Admitting: Psychology

## 2016-04-04 DIAGNOSIS — F1020 Alcohol dependence, uncomplicated: Secondary | ICD-10-CM | POA: Diagnosis not present

## 2016-04-04 DIAGNOSIS — F332 Major depressive disorder, recurrent severe without psychotic features: Secondary | ICD-10-CM

## 2016-04-25 ENCOUNTER — Ambulatory Visit (INDEPENDENT_AMBULATORY_CARE_PROVIDER_SITE_OTHER): Payer: Medicare Other | Admitting: Psychology

## 2016-04-25 DIAGNOSIS — F332 Major depressive disorder, recurrent severe without psychotic features: Secondary | ICD-10-CM

## 2016-04-25 DIAGNOSIS — F1020 Alcohol dependence, uncomplicated: Secondary | ICD-10-CM

## 2016-04-27 ENCOUNTER — Encounter (INDEPENDENT_AMBULATORY_CARE_PROVIDER_SITE_OTHER): Payer: Medicare Other | Admitting: Ophthalmology

## 2016-04-27 DIAGNOSIS — H43813 Vitreous degeneration, bilateral: Secondary | ICD-10-CM | POA: Diagnosis not present

## 2016-04-27 DIAGNOSIS — I10 Essential (primary) hypertension: Secondary | ICD-10-CM | POA: Diagnosis not present

## 2016-04-27 DIAGNOSIS — H3581 Retinal edema: Secondary | ICD-10-CM

## 2016-04-27 DIAGNOSIS — H35033 Hypertensive retinopathy, bilateral: Secondary | ICD-10-CM | POA: Diagnosis not present

## 2016-04-27 DIAGNOSIS — H35131 Retinopathy of prematurity, stage 2, right eye: Secondary | ICD-10-CM | POA: Diagnosis not present

## 2016-05-09 ENCOUNTER — Ambulatory Visit (INDEPENDENT_AMBULATORY_CARE_PROVIDER_SITE_OTHER): Payer: Medicare Other | Admitting: Psychology

## 2016-05-09 DIAGNOSIS — F1020 Alcohol dependence, uncomplicated: Secondary | ICD-10-CM

## 2016-05-09 DIAGNOSIS — F332 Major depressive disorder, recurrent severe without psychotic features: Secondary | ICD-10-CM

## 2016-05-23 ENCOUNTER — Ambulatory Visit (INDEPENDENT_AMBULATORY_CARE_PROVIDER_SITE_OTHER): Payer: Medicare Other | Admitting: Psychology

## 2016-05-23 DIAGNOSIS — F1020 Alcohol dependence, uncomplicated: Secondary | ICD-10-CM

## 2016-05-23 DIAGNOSIS — F332 Major depressive disorder, recurrent severe without psychotic features: Secondary | ICD-10-CM | POA: Diagnosis not present

## 2016-06-06 ENCOUNTER — Ambulatory Visit (INDEPENDENT_AMBULATORY_CARE_PROVIDER_SITE_OTHER): Payer: Medicare Other | Admitting: Psychology

## 2016-06-06 DIAGNOSIS — F331 Major depressive disorder, recurrent, moderate: Secondary | ICD-10-CM | POA: Diagnosis not present

## 2016-06-27 ENCOUNTER — Ambulatory Visit: Payer: Medicare Other | Admitting: Psychology

## 2016-07-18 ENCOUNTER — Ambulatory Visit: Payer: Medicare Other | Admitting: Psychology

## 2016-08-01 ENCOUNTER — Ambulatory Visit (INDEPENDENT_AMBULATORY_CARE_PROVIDER_SITE_OTHER): Payer: Medicare Other | Admitting: Psychology

## 2016-08-01 DIAGNOSIS — F332 Major depressive disorder, recurrent severe without psychotic features: Secondary | ICD-10-CM

## 2016-08-01 DIAGNOSIS — F1020 Alcohol dependence, uncomplicated: Secondary | ICD-10-CM

## 2016-09-22 ENCOUNTER — Ambulatory Visit (INDEPENDENT_AMBULATORY_CARE_PROVIDER_SITE_OTHER): Payer: Medicare Other | Admitting: Orthopaedic Surgery

## 2016-09-22 ENCOUNTER — Ambulatory Visit (INDEPENDENT_AMBULATORY_CARE_PROVIDER_SITE_OTHER): Payer: Medicare Other

## 2016-09-22 DIAGNOSIS — M25551 Pain in right hip: Secondary | ICD-10-CM

## 2016-09-22 DIAGNOSIS — Z96641 Presence of right artificial hip joint: Secondary | ICD-10-CM

## 2016-09-22 NOTE — Progress Notes (Signed)
Office Visit Note   Patient: Jesse Robbins           Date of Birth: 1941-02-06           MRN: [id number] Visit Date: 09/22/2016              Requested by: [name], MD [address] [place], [place] [address] PCP: Marylene Land, MD   Assessment & Plan: Visit Diagnoses:  1. Pain in right hip   2. History of right hip replacement     Plan: I don't think this is an issue with his hip replacement on the right side. His pain is not in his groin nor is it over the trochanteric area. It seems to be more sciatic related. I'm am sending him to physical therapy at [facility] physical therapy and see if they can get his symptoms to subside. We'll see him back in about 6 weeks to see how is doing overall  Follow-Up Instructions: Return in about 6 weeks (around 11/03/2016).   Orders:  Orders Placed This Encounter  Procedures  . XR HIP UNILAT W OR W/O PELVIS 1V RIGHT   No orders of the defined types were placed in this encounter.     Procedures: No procedures performed   Clinical Data: No additional findings.   Subjective: Chief Complaint  Patient presents with  . Right Hip - Pain    Having hip pain recently. Back doc thinks its from hip not back Pain mostly with stairs. Making him limp    HPI We performed a right total hip arthroplasty through a direct anterior approach on him 3 years ago. He comes in today with sciatica types of symptoms for the last year. He points to his backside and radiating down his leg in his shins as source of his pain. His remote history of present significant back surgery. He denies any pain in the groin. He denies any pain of the trochanteric area either. Review of Systems  Negative for headache, chest pain, shortness of breath, fever, chills, nausea, vomiting. Objective: Vital Signs: There were no vitals taken for this visit.  Physical Exam He is alert and oriented 3 in no acute distress Ortho Exam  Specialty Comments:  No  specialty comments available.  Imaging: Xr Hip Unilat W Or W/o Pelvis 1v Right  Result Date: 09/22/2016 An AP pelvis and a lateral of his right operative hip from 3 years ago shows no acute findings. The implants themselves appear bone ingrown there is no lucency around the implants and no significant effusion or worsen features.    PMFS History: Patient Active Problem List   Diagnosis Date Noted  . Continuous chronic alcoholism ([place]) 12/05/2013  . CAFL (chronic airflow limitation) ([place]) 12/05/2013  . Acid reflux 12/05/2013  . H/O malignant neoplasm of prostate 12/05/2013  . History of repair of hip joint 12/05/2013  . H/O transient cerebral ischemia 12/05/2013  . Chronic obstructive pulmonary disease ([place]) 12/05/2013  . Idiopathic scoliosis and kyphoscoliosis 08/21/2013  . Lumbar canal stenosis 08/21/2013  . 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[place]) 04/16/2013  . Pre-op evaluation 04/06/2013  . Essential hypertension 04/06/2013  . Dyslipidemia 04/06/2013   Past Medical History:  Diagnosis Date  . Anxiety   . Arthritis   . COPD (chronic obstructive pulmonary disease)   . Headache(784.0)    migraine  . HTN (hypertension)   . Hyperlipidemia   . Prostate cancer 2004   seed implant and beam radiation  .  Stroke    2010 TIA  . TIA (transient ischemic attack) 06/2009    Family History  Problem Relation Age of Onset  . Cancer Mother     lung and colon  . Stroke Father   . Cancer Maternal Grandfather     lung  . Heart attack Brother   . Hypertension Brother     hyperlipidemia    Past Surgical History:  Procedure Laterality Date  . APPENDECTOMY  1952  . RHINOPLASTY    . TONSILLECTOMY  1950  . TOTAL HIP ARTHROPLASTY Right 04/16/2013   Dr Ninfa Linden  . TOTAL HIP ARTHROPLASTY Right 04/16/2013   Procedure: RIGHT TOTAL HIP ARTHROPLASTY ANTERIOR APPROACH;  Surgeon: Mcarthur Rossetti, MD;  Location: [facility];  Service: Orthopedics;  Laterality: Right;   Social History    Occupational History  . Not on file.   Social History Main Topics  . Smoking status: Former Smoker    Packs/day: 1.00    Years: 25.00    Quit date: 04/05/1998  . Smokeless tobacco: Never Used  . Alcohol use 4.2 oz/week    7 Shots of liquor per week     Comment: 6 drinks every day  . Drug use:     Types: Marijuana     Comment: less then 1/2 gm a day  . Sexual activity: Not on file

## 2016-10-07 ENCOUNTER — Other Ambulatory Visit: Payer: Self-pay | Admitting: Family Medicine

## 2016-10-07 DIAGNOSIS — M79605 Pain in left leg: Secondary | ICD-10-CM

## 2016-10-24 ENCOUNTER — Ambulatory Visit (INDEPENDENT_AMBULATORY_CARE_PROVIDER_SITE_OTHER): Payer: Medicare Other | Admitting: Psychology

## 2016-10-24 DIAGNOSIS — F1020 Alcohol dependence, uncomplicated: Secondary | ICD-10-CM

## 2016-10-24 DIAGNOSIS — F332 Major depressive disorder, recurrent severe without psychotic features: Secondary | ICD-10-CM | POA: Diagnosis not present

## 2016-10-25 ENCOUNTER — Ambulatory Visit
Admission: RE | Admit: 2016-10-25 | Discharge: 2016-10-25 | Disposition: A | Discharge status: Home / Self Care | Payer: Medicare Other | Source: Ambulatory Visit | Attending: Family Medicine | Admitting: Family Medicine

## 2016-10-25 ENCOUNTER — Other Ambulatory Visit: Payer: Self-pay | Admitting: Family Medicine

## 2016-10-25 DIAGNOSIS — M79605 Pain in left leg: Secondary | ICD-10-CM

## 2016-10-25 MED ORDER — METHYLPREDNISOLONE ACETATE 40 MG/ML INJ SUSP (RADIOLOG
120.0000 mg | Freq: Once | INTRAMUSCULAR | Status: AC
  Administered 2016-10-25: 120 mg via EPIDURAL

## 2016-10-25 MED ORDER — IOPAMIDOL (ISOVUE-M 200) INJECTION 41%
1.0000 mL | Freq: Once | INTRAMUSCULAR | Status: AC
  Administered 2016-10-25: 1 mL via EPIDURAL

## 2016-10-25 NOTE — Discharge Instructions (Signed)

## 2016-11-03 ENCOUNTER — Ambulatory Visit (INDEPENDENT_AMBULATORY_CARE_PROVIDER_SITE_OTHER): Payer: Medicare Other | Admitting: Orthopaedic Surgery

## 2016-11-17 ENCOUNTER — Other Ambulatory Visit: Payer: Self-pay | Admitting: Family Medicine

## 2016-11-17 DIAGNOSIS — M79604 Pain in right leg: Secondary | ICD-10-CM

## 2016-11-17 DIAGNOSIS — M79605 Pain in left leg: Principal | ICD-10-CM

## 2016-11-25 ENCOUNTER — Inpatient Hospital Stay
Admission: RE | Admit: 2016-11-25 | Discharge: 2016-11-25 | Disposition: A | Discharge status: Home / Self Care | Payer: Self-pay | Source: Ambulatory Visit | Attending: Family Medicine | Admitting: Family Medicine

## 2016-11-25 NOTE — Discharge Instructions (Signed)

## 2016-11-30 ENCOUNTER — Other Ambulatory Visit: Payer: Self-pay

## 2016-12-05 ENCOUNTER — Ambulatory Visit (INDEPENDENT_AMBULATORY_CARE_PROVIDER_SITE_OTHER): Payer: Medicare Other | Admitting: Psychology

## 2016-12-05 DIAGNOSIS — F332 Major depressive disorder, recurrent severe without psychotic features: Secondary | ICD-10-CM | POA: Diagnosis not present

## 2016-12-05 DIAGNOSIS — F1020 Alcohol dependence, uncomplicated: Secondary | ICD-10-CM | POA: Diagnosis not present

## 2016-12-09 ENCOUNTER — Ambulatory Visit
Admission: RE | Admit: 2016-12-09 | Discharge: 2016-12-09 | Disposition: A | Discharge status: Home / Self Care | Payer: Medicare Other | Source: Ambulatory Visit | Attending: Family Medicine | Admitting: Family Medicine

## 2016-12-09 DIAGNOSIS — M79604 Pain in right leg: Secondary | ICD-10-CM

## 2016-12-09 DIAGNOSIS — M79605 Pain in left leg: Principal | ICD-10-CM

## 2016-12-09 MED ORDER — IOPAMIDOL (ISOVUE-M 200) INJECTION 41%
10.0000 mL | Freq: Once | INTRAMUSCULAR | Status: AC
  Administered 2016-12-09: 1 mL via EPIDURAL

## 2016-12-09 MED ORDER — METHYLPREDNISOLONE ACETATE 40 MG/ML INJ SUSP (RADIOLOG
120.0000 mg | Freq: Once | INTRAMUSCULAR | Status: AC
  Administered 2016-12-09: 120 mg via EPIDURAL

## 2016-12-19 ENCOUNTER — Ambulatory Visit (INDEPENDENT_AMBULATORY_CARE_PROVIDER_SITE_OTHER): Payer: Medicare Other | Admitting: Psychology

## 2016-12-19 DIAGNOSIS — F332 Major depressive disorder, recurrent severe without psychotic features: Secondary | ICD-10-CM | POA: Diagnosis not present

## 2016-12-19 DIAGNOSIS — F1020 Alcohol dependence, uncomplicated: Secondary | ICD-10-CM | POA: Diagnosis not present

## 2017-02-22 ENCOUNTER — Ambulatory Visit: Payer: Medicare Other | Admitting: Psychology

## 2017-03-21 ENCOUNTER — Ambulatory Visit: Payer: Medicare Other | Admitting: Psychology

## 2017-04-04 ENCOUNTER — Ambulatory Visit (INDEPENDENT_AMBULATORY_CARE_PROVIDER_SITE_OTHER): Payer: Medicare Other | Admitting: Psychology

## 2017-04-04 DIAGNOSIS — F1020 Alcohol dependence, uncomplicated: Secondary | ICD-10-CM

## 2017-04-04 DIAGNOSIS — F332 Major depressive disorder, recurrent severe without psychotic features: Secondary | ICD-10-CM

## 2017-04-25 ENCOUNTER — Ambulatory Visit (INDEPENDENT_AMBULATORY_CARE_PROVIDER_SITE_OTHER): Payer: Medicare Other | Admitting: Psychology

## 2017-04-25 DIAGNOSIS — F331 Major depressive disorder, recurrent, moderate: Secondary | ICD-10-CM

## 2017-04-25 DIAGNOSIS — F1020 Alcohol dependence, uncomplicated: Secondary | ICD-10-CM

## 2017-05-02 ENCOUNTER — Ambulatory Visit: Payer: Medicare Other | Admitting: Psychology

## 2017-05-08 ENCOUNTER — Ambulatory Visit (INDEPENDENT_AMBULATORY_CARE_PROVIDER_SITE_OTHER): Payer: Medicare Other | Admitting: Psychology

## 2017-05-08 DIAGNOSIS — F1020 Alcohol dependence, uncomplicated: Secondary | ICD-10-CM | POA: Diagnosis not present

## 2017-05-08 DIAGNOSIS — F332 Major depressive disorder, recurrent severe without psychotic features: Secondary | ICD-10-CM

## 2017-05-22 ENCOUNTER — Ambulatory Visit (INDEPENDENT_AMBULATORY_CARE_PROVIDER_SITE_OTHER): Payer: Medicare Other | Admitting: Psychology

## 2017-05-22 DIAGNOSIS — F1020 Alcohol dependence, uncomplicated: Secondary | ICD-10-CM

## 2017-05-22 DIAGNOSIS — F332 Major depressive disorder, recurrent severe without psychotic features: Secondary | ICD-10-CM

## 2018-02-21 ENCOUNTER — Other Ambulatory Visit: Payer: Self-pay | Admitting: Family Medicine

## 2018-02-21 ENCOUNTER — Ambulatory Visit
Admission: RE | Admit: 2018-02-21 | Discharge: 2018-02-21 | Disposition: A | Discharge status: Home / Self Care | Payer: Medicare Other | Source: Ambulatory Visit | Attending: Family Medicine | Admitting: Family Medicine

## 2018-02-21 DIAGNOSIS — M25512 Pain in left shoulder: Secondary | ICD-10-CM

## 2018-02-21 DIAGNOSIS — M542 Cervicalgia: Secondary | ICD-10-CM

## 2018-03-30 ENCOUNTER — Other Ambulatory Visit: Payer: Self-pay | Admitting: Family Medicine

## 2018-03-30 DIAGNOSIS — M542 Cervicalgia: Secondary | ICD-10-CM

## 2018-04-09 ENCOUNTER — Other Ambulatory Visit: Payer: Self-pay | Admitting: Family Medicine

## 2018-04-09 ENCOUNTER — Ambulatory Visit
Admission: RE | Admit: 2018-04-09 | Discharge: 2018-04-09 | Disposition: A | Discharge status: Home / Self Care | Payer: Medicare Other | Source: Ambulatory Visit | Attending: Family Medicine | Admitting: Family Medicine

## 2018-04-09 DIAGNOSIS — M542 Cervicalgia: Secondary | ICD-10-CM

## 2018-04-12 ENCOUNTER — Other Ambulatory Visit: Payer: Self-pay | Admitting: Family Medicine

## 2018-04-12 DIAGNOSIS — M4802 Spinal stenosis, cervical region: Secondary | ICD-10-CM

## 2018-04-20 ENCOUNTER — Ambulatory Visit
Admission: RE | Admit: 2018-04-20 | Discharge: 2018-04-20 | Disposition: A | Discharge status: Home / Self Care | Payer: Medicare Other | Source: Ambulatory Visit | Attending: Family Medicine | Admitting: Family Medicine

## 2018-04-20 DIAGNOSIS — M4802 Spinal stenosis, cervical region: Secondary | ICD-10-CM

## 2018-04-20 MED ORDER — IOPAMIDOL (ISOVUE-M 200) INJECTION 41%
1.0000 mL | Freq: Once | INTRAMUSCULAR | Status: AC
  Administered 2018-04-20: 1 mL via EPIDURAL

## 2018-04-20 MED ORDER — TRIAMCINOLONE ACETONIDE 40 MG/ML IJ SUSP (RADIOLOGY)
60.0000 mg | Freq: Once | INTRAMUSCULAR | Status: AC
  Administered 2018-04-20: 60 mg via EPIDURAL

## 2018-04-20 NOTE — Discharge Instructions (Signed)

## 2018-07-03 ENCOUNTER — Other Ambulatory Visit: Payer: Self-pay | Admitting: Urology

## 2018-07-03 DIAGNOSIS — C61 Malignant neoplasm of prostate: Secondary | ICD-10-CM

## 2018-07-10 ENCOUNTER — Ambulatory Visit (HOSPITAL_COMMUNITY)
Admission: RE | Admit: 2018-07-10 | Discharge: 2018-07-10 | Disposition: A | Discharge status: Home / Self Care | Payer: Medicare Other | Source: Ambulatory Visit | Attending: Urology | Admitting: Urology

## 2018-07-10 DIAGNOSIS — N3090 Cystitis, unspecified without hematuria: Secondary | ICD-10-CM | POA: Insufficient documentation

## 2018-07-10 DIAGNOSIS — K4090 Unilateral inguinal hernia, without obstruction or gangrene, not specified as recurrent: Secondary | ICD-10-CM | POA: Insufficient documentation

## 2018-07-10 DIAGNOSIS — C61 Malignant neoplasm of prostate: Secondary | ICD-10-CM

## 2018-07-10 DIAGNOSIS — Z96641 Presence of right artificial hip joint: Secondary | ICD-10-CM | POA: Diagnosis not present

## 2018-07-10 DIAGNOSIS — R9721 Rising PSA following treatment for malignant neoplasm of prostate: Secondary | ICD-10-CM | POA: Insufficient documentation

## 2018-07-10 MED ORDER — GADOBUTROL 1 MMOL/ML IV SOLN
7.5000 mL | Freq: Once | INTRAVENOUS | Status: AC | PRN
  Administered 2018-07-10: 7.5 mL via INTRAVENOUS

## 2018-09-17 ENCOUNTER — Other Ambulatory Visit: Payer: Self-pay | Admitting: Family Medicine

## 2018-09-17 ENCOUNTER — Ambulatory Visit
Admission: RE | Admit: 2018-09-17 | Discharge: 2018-09-17 | Disposition: A | Discharge status: Home / Self Care | Payer: Medicare Other | Source: Ambulatory Visit | Attending: Family Medicine | Admitting: Family Medicine

## 2018-09-17 DIAGNOSIS — Z981 Arthrodesis status: Secondary | ICD-10-CM

## 2018-10-16 ENCOUNTER — Other Ambulatory Visit: Payer: Self-pay | Admitting: Family Medicine

## 2018-10-16 ENCOUNTER — Ambulatory Visit
Admission: RE | Admit: 2018-10-16 | Discharge: 2018-10-16 | Disposition: A | Discharge status: Home / Self Care | Payer: Medicare Other | Source: Ambulatory Visit | Attending: Family Medicine | Admitting: Family Medicine

## 2018-10-16 DIAGNOSIS — M25551 Pain in right hip: Secondary | ICD-10-CM

## 2018-11-01 DIAGNOSIS — R972 Elevated prostate specific antigen [PSA]: Secondary | ICD-10-CM | POA: Insufficient documentation

## 2019-06-06 ENCOUNTER — Telehealth: Payer: Self-pay | Admitting: *Deleted

## 2019-06-06 NOTE — Telephone Encounter (Signed)
Called pt and lvm #1 to get pt scheduled for an appt.

## 2019-06-10 ENCOUNTER — Other Ambulatory Visit (HOSPITAL_COMMUNITY): Payer: Self-pay | Admitting: Urology

## 2019-06-10 DIAGNOSIS — C61 Malignant neoplasm of prostate: Secondary | ICD-10-CM

## 2019-06-26 ENCOUNTER — Ambulatory Visit (INDEPENDENT_AMBULATORY_CARE_PROVIDER_SITE_OTHER): Payer: Medicare Other | Admitting: Physical Medicine and Rehabilitation

## 2019-06-26 ENCOUNTER — Encounter: Payer: Self-pay | Admitting: Physical Medicine and Rehabilitation

## 2019-06-26 VITALS — BP 115/72 | HR 77

## 2019-06-26 DIAGNOSIS — R269 Unspecified abnormalities of gait and mobility: Secondary | ICD-10-CM | POA: Diagnosis not present

## 2019-06-26 DIAGNOSIS — M961 Postlaminectomy syndrome, not elsewhere classified: Secondary | ICD-10-CM

## 2019-06-26 DIAGNOSIS — M4120 Other idiopathic scoliosis, site unspecified: Secondary | ICD-10-CM | POA: Diagnosis not present

## 2019-06-26 DIAGNOSIS — M545 Low back pain: Secondary | ICD-10-CM

## 2019-06-26 DIAGNOSIS — G8929 Other chronic pain: Secondary | ICD-10-CM

## 2019-06-26 NOTE — Progress Notes (Signed)
  Numeric Pain Rating Scale and Functional Assessment Average Pain 6 Pain Right Now 0 My pain is intermittent Pain is worse with: walking Pain improves with: rest and sitting, lying down   In the last MONTH (on 0-10 scale) has pain interfered with the following?  1. General activity like being  able to carry out your everyday physical activities such as walking, climbing stairs, carrying groceries, or moving a chair?  Rating(2)  2. Relation with others like being able to carry out your usual social activities and roles such as  activities at home, at work and in your community. Rating(2)  3. Enjoyment of life such that you have  been bothered by emotional problems such as feeling anxious, depressed or irritable?  Rating(0)

## 2019-07-03 ENCOUNTER — Ambulatory Visit (HOSPITAL_COMMUNITY): Payer: Medicare Other

## 2019-07-03 ENCOUNTER — Encounter (HOSPITAL_COMMUNITY): Payer: Self-pay

## 2019-07-08 ENCOUNTER — Ambulatory Visit (INDEPENDENT_AMBULATORY_CARE_PROVIDER_SITE_OTHER): Payer: Medicare Other

## 2019-07-08 ENCOUNTER — Encounter: Payer: Self-pay | Admitting: Orthopaedic Surgery

## 2019-07-08 ENCOUNTER — Ambulatory Visit (INDEPENDENT_AMBULATORY_CARE_PROVIDER_SITE_OTHER): Payer: Medicare Other | Admitting: Orthopaedic Surgery

## 2019-07-08 ENCOUNTER — Other Ambulatory Visit: Payer: Self-pay

## 2019-07-08 VITALS — Ht 68.0 in | Wt 160.0 lb

## 2019-07-08 DIAGNOSIS — M25551 Pain in right hip: Secondary | ICD-10-CM

## 2019-07-08 NOTE — Progress Notes (Signed)
Office Visit Note   Patient: Jesse Robbins           Date of Birth: 1941/07/28           MRN: [id number] Visit Date: 07/08/2019              Requested by: [name], MD [address] [place],  [place] [address] PCP: [name], MD   Assessment & Plan: Visit Diagnoses:  1. Pain in right hip     Plan: I did give him reassurance that his right total hip arthroplasty is doing well in his left hip appears normal.  I do not feel that this is an issue as it relates to the muscles around the right hip or his hip replacement.  This gave him reassurance and he was pleased to hear that.  He is had no other issues with the right hip in the last 6 years.  All question concerns were answered and addressed.  Follow-up will be as needed.  Follow-Up Instructions: Return if symptoms worsen or fail to improve.   Orders:  Orders Placed This Encounter  Procedures  . XR HIP UNILAT W OR W/O PELVIS 2-3 VIEWS RIGHT   No orders of the defined types were placed in this encounter.     Procedures: No procedures performed   Clinical Data: No additional findings.   Subjective: Chief Complaint  Patient presents with  . Right Hip - Pain  The patient is a very pleasant 78 year old gentleman who is 6 years out from a right total hip arthroplasty.  He is someone who has a significant spine fusion from the upper thoracic spine down to the lumbar spine.  He has been walking little awkwardly with his gait and even when I will see him walk he walks leaning to the side.  They want to make sure is not a hip issue.  He denies any right lower extremity pain and denies any groin pain and has not had any issues with his right hip.  HPI  Review of Systems He currently denies any headache, chest pain, shortness of breath, fever, chills, nausea, vomiting  Objective: Vital Signs: Ht 5\' 8"  (1.727 m)   Wt 160 lb (72.6 kg)   BMI 24.33 kg/m   Physical Exam He is alert and orient x3 and in no acute  distress Ortho Exam Examination of his right hip shows that he moves smoothly and fluidly and has no pain in the groin at all.  I had him lay supine and there is no leg length discrepancy.  I did see him walk and he does not walk with a Trendelenburg gait he does lean to the right side likely related to previous spine surgery. Specialty Comments:  No specialty comments available.  Imaging: Xr Hip Unilat W Or W/o Pelvis 2-3 Views Right  Result Date: 07/08/2019 An AP pelvis and lateral of the right hip shows a well-seated total hip arthroplasty with no complicating features.  There is no evidence of loosening or malalignment.  The left hip shows no arthritic changes on the AP view.    PMFS History: Patient Active Problem List   Diagnosis Date Noted  . Continuous chronic alcoholism ([place]) 12/05/2013  . CAFL (chronic airflow limitation) ([place]) 12/05/2013  . Acid reflux 12/05/2013  . H/O malignant neoplasm of prostate 12/05/2013  . History of repair of hip joint 12/05/2013  . H/O transient cerebral ischemia 12/05/2013  . Chronic obstructive pulmonary disease ([place]) 12/05/2013  . Idiopathic  scoliosis and kyphoscoliosis 08/21/2013  . Lumbar canal stenosis 08/21/2013  . 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[place]) 04/16/2013  . Pre-op evaluation 04/06/2013  . Essential hypertension 04/06/2013  . Dyslipidemia 04/06/2013   Past Medical History:  Diagnosis Date  . Anxiety   . Arthritis   . COPD (chronic obstructive pulmonary disease) ([place])   . Headache(784.0)    migraine  . HTN (hypertension)   . Hyperlipidemia   . Prostate cancer ([place]) 2004   seed implant and beam radiation  . Stroke [facility])    2010 TIA  . TIA (transient ischemic attack) 06/2009    Family History  Problem Relation Age of Onset  . Cancer Mother        lung and colon  . Stroke Father   . Cancer Maternal Grandfather        lung  . Heart attack Brother   . Hypertension Brother        hyperlipidemia    Past  Surgical History:  Procedure Laterality Date  . APPENDECTOMY  1952  . RHINOPLASTY    . TONSILLECTOMY  1950  . TOTAL HIP ARTHROPLASTY Right 04/16/2013   Dr Ninfa Linden  . TOTAL HIP ARTHROPLASTY Right 04/16/2013   Procedure: RIGHT TOTAL HIP ARTHROPLASTY ANTERIOR APPROACH;  Surgeon: Mcarthur Rossetti, MD;  Location: [facility];  Service: Orthopedics;  Laterality: Right;   Social History   Occupational History  . Not on file  Tobacco Use  . Smoking status: Former Smoker    Packs/day: 1.00    Years: 25.00    Pack years: 25.00    Quit date: 04/05/1998    Years since quitting: 21.2  . Smokeless tobacco: Never Used  Substance and Sexual Activity  . Alcohol use: Yes    Alcohol/week: 7.0 standard drinks    Types: 7 Shots of liquor per week    Comment: 6 drinks every day  . Drug use: Yes    Types: Marijuana    Comment: less then 1/2 gm a day  . Sexual activity: Not on file

## 2019-07-09 ENCOUNTER — Ambulatory Visit: Payer: Self-pay

## 2019-07-09 ENCOUNTER — Encounter: Payer: Self-pay | Admitting: Physical Medicine and Rehabilitation

## 2019-07-09 ENCOUNTER — Ambulatory Visit (INDEPENDENT_AMBULATORY_CARE_PROVIDER_SITE_OTHER): Payer: Medicare Other | Admitting: Physical Medicine and Rehabilitation

## 2019-07-09 ENCOUNTER — Other Ambulatory Visit: Payer: Self-pay

## 2019-07-09 VITALS — BP 123/78 | HR 80

## 2019-07-09 DIAGNOSIS — M47816 Spondylosis without myelopathy or radiculopathy, lumbar region: Secondary | ICD-10-CM | POA: Diagnosis not present

## 2019-07-09 MED ORDER — METHYLPREDNISOLONE ACETATE 80 MG/ML IJ SUSP: 80.0000 mg | Freq: Once | INTRAMUSCULAR | Status: DC

## 2019-07-09 MED ORDER — BUPIVACAINE HCL 0.5 % IJ SOLN: 3.0000 mL | Freq: Once | INTRAMUSCULAR | Status: DC

## 2019-07-09 NOTE — Progress Notes (Signed)
 .  Numeric Pain Rating Scale and Functional Assessment Average Pain 4   In the last MONTH (on 0-10 scale) has pain interfered with the following?  1. General activity like being  able to carry out your everyday physical activities such as walking, climbing stairs, carrying groceries, or moving a chair?  Rating(2)   +Driver, -BT, -Dye Allergies.

## 2019-07-09 NOTE — Progress Notes (Signed)
DJ MCCRANIE - 78 y.o. male MRN [id number]  Date of birth: Feb 26, 1941  Office Visit Note: Visit Date: 06/26/2019 PCP: [name], MD Referred by: [name], MD  Subjective: Chief Complaint  Patient presents with   Lower Back - Pain   Right Hip - Other   Gait Problem   HPI: Jesse Robbins is a 78 y.o. male who comes in today At the request of Dr. [name] mainly for what is being referred to as a gait abnormality.  Patient is present today in person with his wife on the phone who also provides some of the history.  Dr. Sandi Mariscal also provided a nice set of notes and a letter explaining the patient's issues.  Briefly, the patient and his wife are commenting on 2 issues with the first mainly being that over the last several months he has felt like he is standing in a different position and walking with his left hip listing to the side.  The other issue is he felt like he has had some right hip grinding.  He reports he really had not noticed that standing or walking until a relative or friend commented on it and then they have been noticing it at home.  He reports not walking like this prior.  His history from an orthopedic standpoint is quite complicated.  He did have a prior total right hip replacement done prior to 2014.  This was done by Dr. Ninfa Linden actually in our office.  He has not had follow-up with him recently.  Last follow-up did indicate that the hip looked like it was well positioned with no issues.  A secondary and probably more problematic issue has been history of Posterior decompression and fusion T10-L5 with TLIF L3-4 and L4-5 12/06/13 by Dr. Agapito Games at [facility].  I did review all of Dr. Nena Alexander notes.  According to those notes and the patient the patient was having considerable amount of back pain prior to 2014 and actually had epidural injection which made his symptoms worse.  He had had some issues with opioid medications really not helping to a degree  at times and then helping but he really did not want to take those.  He ultimately saw Dr. Harl Bowie who is a neurosurgeon at [facility] and decompression was offered at that lower level.  He went on to see Dr. [name] Plan who recommended correction of scoliosis along with correction of the stenosis and listhesis at L4-5.  Patient did get some pain relief with this and was doing fairly well but had some residual pain in the limbs at times.  He has had a few epidural injections at [facility] for his leg pain.  Today is not really endorsing much in the way of leg pain or back pain although he does have some.  He rates his pain as a 6 out of 10.  He used to be able to take some pain medication and do walking for exercise but he has not been able to do that.  He reports intermittent pain and is worse with walking.  His biggest complaint is the posture and position change with walking.  He has not been to any recent focused physical therapy.  He has not had recent follow-up with Dr. [name] Plan.  Last notes that I could see were 2017.  He has no red flag complaints of paresthesias or focal weakness.  His history is complicated as well as he is being treated for prostate cancer.  His history is also problematic for history of alcohol abuse and treatment.  He is not on any narcotic medications for pain although he does take clonazepam and Xanax.  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  Review of Systems  Constitutional: Negative for chills, fever, malaise/fatigue and weight loss.  HENT: Negative for hearing loss and sinus pain.   Eyes: Negative for blurred vision, double vision and photophobia.  Respiratory: Negative for cough and shortness of breath.   Cardiovascular: Negative for chest pain, palpitations and leg swelling.  Gastrointestinal: Negative for abdominal pain, nausea and vomiting.  Genitourinary: Negative for flank pain.  Musculoskeletal: Positive for back pain and joint pain.  Negative for myalgias.       Trouble with gait  Skin: Negative for itching and rash.  Neurological: Negative for tingling, tremors, focal weakness and weakness.  Endo/Heme/Allergies: Negative.   Psychiatric/Behavioral: Negative for depression.  All other systems reviewed and are negative.  Otherwise per HPI.  Assessment & Plan: Visit Diagnoses:  1. Abnormal gait   2. Post laminectomy syndrome   3. Chronic bilateral low back pain without sciatica   4. Idiopathic scoliosis and kyphoscoliosis     Plan: Findings:  In terms of his gait he does seem to be walking with his left hip adjusted laterally and his upper torso with a rightward lean.  Reviewing x-rays that were more recent by Dr. Sandi Mariscal he does seem to have some continued junctional issue above the fusion.  He has had a history of osteopenia.  I think from that regard it would be wise at some point to consider follow-up appointment with Dr. [name] Plan at [facility].  He is not fused to the sacrum.  He has had epidural type injections below this area.  Some of the problems with walking could be related to issue between L5 and the sacrum with facet arthropathy.  Even though he does not endorse a great deal of pain it could be an issue that he is finding the path of least resistance and trying to walk to avoid pain at that point which can be somewhat sub-conscious.  Lastly I think he would do well with follow-up with Dr. Ninfa Linden just to review his hip.  He has a lot of concerns about his hip and that is really 1 of his main complaints is just being concerned about the placement of the hip and how it is doing.  In terms of treatment that I can offer I think the best approach is focused physical therapy for good evaluation of his posture and gait and see if there is anything from a physical standpoint we could look at.  I guess one could entertain the idea of bracing although we do not really like to do that.  This is somewhat a little bit out of my  expertise at this point since we do not deal with gait as much as we used to.  I have written a prescription for [facility] physical therapy and I think he would be worthwhile for a few sessions with him to see if there is anything they can help with physically.  Diagnostic medial branch blocks could be performed at L5-S1 just to see if it changes anything with his gait pattern or helps him with his walking.  We went over the rationale at length for him.  We will see him back for the injection and see how he is doing with physical therapy at that point.    Meds & Orders:  No orders of the defined types were placed in this encounter.   Orders Placed This Encounter  Procedures   Ambulatory referral to Physical Therapy    Follow-up: Return for Medial branch blocks at L5-S1.   Procedures: No procedures performed  No notes on file   Clinical History: LUMBAR SPINE - COMPLETE 4+ VIEW  Posterior fusion hardware from approximately T10 to the L5 intact. Moderate spondylosis throughout the lumbar spine and visualized lower thoracic spine. Interbody fusion at the L3-4 L4-5 levels. Disc space narrowing at all levels of the lumbar spine. Mild grade 1 anterolisthesis of L4 on L5. Calcified plaque over the abdominal aorta.  IMPRESSION: Moderate spondylosis of the lumbar spine with disc disease throughout the lumbar spine. Posterior fusion hardware from approximately T10-L5 intact. Mild grade 1 anterolisthesis of L4 on L5.  On: 09/17/2018 16:46  ----- Radiographs of the entire thoracic and lumbar spine - 2 views, 6 images.   Comparison: 12/17/2014  Findings: Status post T10-L5 posterior spinous fusion. Hardware is unchanged without evidence of failure. No acute fractures or subluxations. Unchanged lucency about the left L4 pedicle screw. Alignment and vertebral body heights are stable. Multilevel degenerative disc disease especially of the midthoracic spine. Chronic anterolisthesis  L4-5. Visualized lungs are clear. Atherosclerotic aortic calcifications unchanged. Right total hip arthroplasty unchanged. Prostate radiotherapy seeds. Visualized chest, abdomen and pelvis unremarkable.  Impression:  No change in hardware and alignment status post thoracolumbar fusion.  Electronically Signed on: 03/10/2016 11:27 AM -- MRI LUMBAR SPINE WITHOUT CONTRAST  TECHNIQUE: Multiplanar, multisequence MR imaging of the lumbar spine was performed. No intravenous contrast was administered.  COMPARISON:  09/03/2013  FINDINGS: The vertebral bodies of the lumbar spine are normal in size. There is stable 4 mm of anterolisthesis of L4 on L5. There is normal bone marrow signal demonstrated throughout the vertebra.  There is interval posterior lumbar fusion from T11 through L5 with pedicle screws in satisfactory position. There are unilateral pedicle screws at T12, L1 and L2. There is a 1.7 x 3.1 x 3.6 cm fluid collection in the surgical bed posterior to the L4 posterior elements likely reflecting a postoperative seroma.  The spinal cord is normal in signal and contour. The cord terminates normally at L1 . The nerve roots of the cauda equina and the filum terminale are normal.  The visualized portions of the SI joints are unremarkable.  The imaged intra-abdominal contents are unremarkable.  T12-L1: Broad disc osteophyte complex. Moderate spinal stenosis scratch them mild spinal stenosis. No significant foraminal stenosis.  L1-L2: Broad-based disc osteophyte complex. Moderate spinal stenosis. No significant foraminal stenosis.  L2-L3: Broad-based disc osteophyte complex and facet arthropathy. Right foraminal stenosis. Mild spinal stenosis.  L3-L4: Interbody fusion. No evidence of neural foraminal stenosis. No central canal stenosis.  L4-L5: Broad-based disc osteophyte complex. No evidence of neural foraminal stenosis. No central canal stenosis.  L5-S1:  Broad-based disc bulge. Moderate left foraminal stenosis. Mild right foraminal stenosis. No central canal stenosis.  IMPRESSION: Overall evaluation of the central canal and foramina is limited secondary to susceptibility artifact resulting from the extensive orthopedic hardware obscuring the adjacent soft tissue and osseous structures.  1. Posterior lumbar fusion from T11 through L5. Postsurgical fluid collection in the posterior surgical bed likely representing a seroma. 2. At T12-L1 there is a broad disc osteophyte complex. Moderate spinal stenosis scratch them mild spinal stenosis. No significant foraminal stenosis. 3. At L1-2 there is a broad based disc osteophyte complex. Moderate spinal stenosis. No significant foraminal stenosis.  4. At L2-3 there is a broad-based disc osteophyte complex and facet arthropathy. Right foraminal stenosis. Mild spinal stenosis. 5. At L5-S1 there is a broad-based disc bulge and moderate left foraminal stenosis.    By: Kathreen Devoid   On: 07/19/2015 13:40   He reports that he quit smoking about 21 years ago. He has a 25.00 pack-year smoking history. He has never used smokeless tobacco. No results for input(s): HGBA1C, LABURIC in the last 8760 hours.  Objective:  VS:  HT:     WT:    BMI:      BP:115/72   HR:77bpm   TEMP: ( )   RESP:  Physical Exam Vitals signs and nursing note reviewed.  Constitutional:      General: He is not in acute distress.    Appearance: He is well-developed.  HENT:     Head: Normocephalic and atraumatic.     Nose: Nose normal.     Mouth/Throat:     Mouth: Mucous membranes are moist.     Pharynx: Oropharynx is clear.  Eyes:     Conjunctiva/sclera: Conjunctivae normal.     Pupils: Pupils are equal, round, and reactive to light.  Neck:     Musculoskeletal: Normal range of motion and neck supple.     Trachea: No tracheal deviation.  Cardiovascular:     Rate and Rhythm: Normal rate and regular rhythm.     Pulses:  Normal pulses.  Pulmonary:     Effort: Pulmonary effort is normal.     Breath sounds: Normal breath sounds.  Abdominal:     General: There is no distension.     Palpations: Abdomen is soft.     Tenderness: There is no guarding or rebound.  Musculoskeletal:        General: Deformity present.     Right lower leg: No edema.     Left lower leg: No edema.     Comments: Watching the patient walk he does lean to the right with his upper body with some lateral slide of the left hip.  There is no circumduction there is no hip hiking etc.  He has good distal strength without any deficits.  He has no pain with hip rotations.  He is a little bit stiff on the left hip.  There is no severe pain to palpation along the spine.  He has no focal trigger points.  He is very stiff in the lumbar spine with very flat back.  Skin:    General: Skin is warm and dry.     Findings: No erythema or rash.  Neurological:     General: No focal deficit present.     Mental Status: He is alert and oriented to person, place, and time.     Sensory: No sensory deficit.     Motor: No weakness or abnormal muscle tone.     Coordination: Coordination normal.     Gait: Gait abnormal.  Psychiatric:        Mood and Affect: Mood normal.        Behavior: Behavior normal.        Thought Content: Thought content normal.     Ortho Exam Imaging: Xr C-arm No Report  Result Date: 07/09/2019 Please see Notes tab for imaging impression.   Past Medical/Family/Surgical/Social History: Medications & Allergies reviewed per EMR, new medications updated. 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[place]) 04/16/2013   Pre-op evaluation 04/06/2013   Essential hypertension 04/06/2013   Dyslipidemia 04/06/2013   Past Medical History:  Diagnosis Date   Anxiety    Arthritis    COPD (chronic obstructive pulmonary disease) (HCC)    Headache(784.0)    migraine   HTN (hypertension)    Hyperlipidemia    Prostate cancer ([place]) 2004   seed implant and beam radiation   Stroke ([place])    2010 TIA   TIA (transient ischemic attack) 06/2009   Family History  Problem Relation Age of Onset   Cancer Mother        lung and colon   Stroke Father    Cancer Maternal Grandfather        lung   Heart attack Brother    Hypertension Brother        hyperlipidemia   Past Surgical History:  Procedure Laterality Date   APPENDECTOMY  1952   RHINOPLASTY     TONSILLECTOMY  1950   TOTAL HIP ARTHROPLASTY Right 04/16/2013   Dr Ninfa Linden   TOTAL HIP ARTHROPLASTY Right 04/16/2013   Procedure: RIGHT TOTAL HIP ARTHROPLASTY ANTERIOR APPROACH;  Surgeon: Mcarthur Rossetti, MD;  Location: [facility];  Service: Orthopedics;  Laterality: Right;   Social History   Occupational History   Not on file  Tobacco Use   Smoking status: Former Smoker    Packs/day: 1.00    Years: 25.00    Pack years: 25.00    Quit date: 04/05/1998    Years since quitting: 21.2   Smokeless tobacco: Never Used  Substance and Sexual Activity   Alcohol use: Yes    Alcohol/week: 7.0 standard drinks    Types: 7 Shots of liquor per week    Comment: 6 drinks every day   Drug use: Yes    Types: Marijuana    Comment: less then 1/2 gm a day   Sexual activity: Not on file

## 2019-07-10 ENCOUNTER — Encounter: Payer: Self-pay | Admitting: Physical Medicine and Rehabilitation

## 2019-07-10 NOTE — Procedures (Signed)
Lumbar Diagnostic Facet Joint Nerve Block with Fluoroscopic Guidance   Patient: Jesse Robbins      Date of Birth: February 06, 1941 MRN: [id number] PCP: [name], MD      Visit Date: 07/09/2019   Universal Protocol:    Date/Time: 09/23/206:23 AM  Consent Given By: the patient  Position: PRONE  Additional Comments: Vital signs were monitored before and after the procedure. Patient was prepped and draped in the usual sterile fashion. The correct patient, procedure, and site was verified.   Injection Procedure Details:  Procedure Site One Meds Administered:  Meds ordered this encounter  Medications  . methylPREDNISolone acetate (DEPO-MEDROL) injection 80 mg  . bupivacaine (MARCAINE) 0.5 % (with pres) injection 3 mL     Laterality: Bilateral  Location/Site:  L5-S1  Needle size: 22 ga.  Needle type:spinal  Needle Placement: Oblique pedical  Findings:   -Comments: There was excellent flow of contrast along the articular pillars without intravascular flow.  Procedure Details: The fluoroscope beam is vertically oriented in AP and then obliqued 15 to 20 degrees to the ipsilateral side of the desired nerve to achieve the "Scotty dog" appearance.  The skin over the target area of the junction of the superior articulating process and the transverse process (sacral ala if blocking the L5 dorsal rami) was locally anesthetized with a 1 ml volume of 1% Lidocaine without Epinephrine.  The spinal needle was inserted and advanced in a trajectory view down to the target.   After contact with periosteum and negative aspirate for blood and CSF, correct placement without intravascular or epidural spread was confirmed by injecting 0.5 ml. of Isovue-250.  A spot radiograph was obtained of this image.    Next, a 0.5 ml. volume of the injectate described above was injected. The needle was then redirected to the other facet joint nerves mentioned above if needed.  Prior to the procedure, the  patient was given a Pain Diary which was completed for baseline measurements.  After the procedure, the patient rated their pain every 30 minutes and will continue rating at this frequency for a total of 5 hours.  The patient has been asked to complete the Diary and return to [place] by mail, fax or hand delivered as soon as possible.   Additional Comments:  The patient tolerated the procedure well Dressing: 2 x 2 sterile gauze and Band-Aid    Post-procedure details: Patient was observed during the procedure. Post-procedure instructions were reviewed.  Patient left the clinic in stable condition.

## 2019-07-10 NOTE — Progress Notes (Signed)
Jesse Robbins - 78 y.o. male MRN [id number]  Date of birth: 06/01/1941  Office Visit Note: Visit Date: 07/09/2019 PCP: [name], MD Referred by: [name], MD  Subjective: Chief Complaint  Patient presents with   Lower Back - Pain   Middle Back - Pain   HPI: Jesse Robbins is a 78 y.o. male who comes in today For planned bilateral medial branch block of the L5-S1 facet joints.  Interestingly we had gone over this quite a bit with him at his last visit which was pretty extensive and long visit.  His wife was on the phone with him at that time.  I think he comes in today and my staff typically asked pretty standard questions in terms of pain issues when the patient comes and just because that is typically what were seen.  He was a little bit agitated today and is really adamant about not being here for pain that he is really here for his problem with walking and posture.  Without really over doing it today on the know that can all be reviewed in the past notes.  I did reassure him that at least on my viewpoint that is why he was here and he we reviewed that with him.  Interestingly he has not seen the physical therapist that I recommended.  He said he really had more things going on and he just could not do that.  I have encouraged him that that may be a source of treatment for his gait and that is why I did send him they are wanting to go there.  We will try the medial branch block today with a "pain diary ".  For him I explained the pain diary to be more of a functional diary.  I would watch him walk out the door today just to see how his gait was looking once we numbed that area up.  Interestingly he did not tolerate even mild needling into the skin or even the surrounding areas.  His history is that he never did well with injections in the past with other practitioners.  He did see Dr. Ninfa Linden in follow-up for his hips which he was given a pretty clean bill health on the hip.  My only  other recommendation going forward to be for him to be reevaluated by Dr. [name] Plan at [facility]  Otherwise he will follow-up with Dr. Sandi Mariscal.  ROS Otherwise per HPI.  Assessment & Plan: Visit Diagnoses:  1. Spondylosis without myelopathy or radiculopathy, lumbar region     Plan: No additional findings.   Meds & Orders:  Meds ordered this encounter  Medications   methylPREDNISolone acetate (DEPO-MEDROL) injection 80 mg   bupivacaine (MARCAINE) 0.5 % (with pres) injection 3 mL    Orders Placed This Encounter  Procedures   Facet Injection   XR C-ARM NO REPORT    Follow-up: No follow-ups on file.   Procedures: No procedures performed  Lumbar Diagnostic Facet Joint Nerve Block with Fluoroscopic Guidance   Patient: Jesse Robbins      Date of Birth: [birth year] MRN: [id number] PCP: [name], MD      Visit Date: 07/09/2019   Universal Protocol:    Date/Time: 09/23/206:23 AM  Consent Given By: the patient  Position: PRONE  Additional Comments: Vital signs were monitored before and after the procedure. Patient was prepped and draped in the usual sterile fashion. The correct patient, procedure, and site was verified.   Injection Procedure Details:  Procedure Site One Meds Administered:  Meds ordered this encounter  Medications   methylPREDNISolone acetate (DEPO-MEDROL) injection 80 mg   bupivacaine (MARCAINE) 0.5 % (with pres) injection 3 mL     Laterality: Bilateral  Location/Site:  L5-S1  Needle size: 22 ga.  Needle type:spinal  Needle Placement: Oblique pedical  Findings:   -Comments: There was excellent flow of contrast along the articular pillars without intravascular flow.  Procedure Details: The fluoroscope beam is vertically oriented in AP and then obliqued 15 to 20 degrees to the ipsilateral side of the desired nerve to achieve the Scotty dog appearance.  The skin over the target area of the junction of the superior articulating process  and the transverse process (sacral ala if blocking the L5 dorsal rami) was locally anesthetized with a 1 ml volume of 1% Lidocaine without Epinephrine.  The spinal needle was inserted and advanced in a trajectory view down to the target.   After contact with periosteum and negative aspirate for blood and CSF, correct placement without intravascular or epidural spread was confirmed by injecting 0.5 ml. of Isovue-250.  A spot radiograph was obtained of this image.    Next, a 0.5 ml. volume of the injectate described above was injected. The needle was then redirected to the other facet joint nerves mentioned above if needed.  Prior to the procedure, the patient was given a Pain Diary which was completed for baseline measurements.  After the procedure, the patient rated their pain every 30 minutes and will continue rating at this frequency for a total of 5 hours.  The patient has been asked to complete the Diary and return to [place] by mail, fax or hand delivered as soon as possible.   Additional Comments:  The patient tolerated the procedure well Dressing: 2 x 2 sterile gauze and Band-Aid    Post-procedure details: Patient was observed during the procedure. Post-procedure instructions were reviewed.  Patient left the clinic in stable condition.   Clinical History: LUMBAR SPINE - COMPLETE 4+ VIEW  Posterior fusion hardware from approximately T10 to the L5 intact. Moderate spondylosis throughout the lumbar spine and visualized lower thoracic spine. Interbody fusion at the L3-4 L4-5 levels. Disc space narrowing at all levels of the lumbar spine. Mild grade 1 anterolisthesis of L4 on L5. Calcified plaque over the abdominal aorta.  IMPRESSION: Moderate spondylosis of the lumbar spine with disc disease throughout the lumbar spine. Posterior fusion hardware from approximately T10-L5 intact. Mild grade 1 anterolisthesis of L4 on L5.  On: 09/17/2018 16:46  ----- Radiographs of the entire  thoracic and lumbar spine - 2 views, 6 images.   Comparison: 12/17/2014  Findings: Status post T10-L5 posterior spinous fusion. Hardware is unchanged without evidence of failure. No acute fractures or subluxations. Unchanged lucency about the left L4 pedicle screw. Alignment and vertebral body heights are stable. Multilevel degenerative disc disease especially of the midthoracic spine. Chronic anterolisthesis L4-5. Visualized lungs are clear. Atherosclerotic aortic calcifications unchanged. Right total hip arthroplasty unchanged. Prostate radiotherapy seeds. Visualized chest, abdomen and pelvis unremarkable.  Impression:  No change in hardware and alignment status post thoracolumbar fusion.  Electronically Signed on: 03/10/2016 11:27 AM -- MRI LUMBAR SPINE WITHOUT CONTRAST  TECHNIQUE: Multiplanar, multisequence MR imaging of the lumbar spine was performed. No intravenous contrast was administered.  COMPARISON:  09/03/2013  FINDINGS: The vertebral bodies of the lumbar spine are normal in size. There is stable 4 mm of anterolisthesis of L4 on L5. There is normal bone marrow signal  demonstrated throughout the vertebra.  There is interval posterior lumbar fusion from T11 through L5 with pedicle screws in satisfactory position. There are unilateral pedicle screws at T12, L1 and L2. There is a 1.7 x 3.1 x 3.6 cm fluid collection in the surgical bed posterior to the L4 posterior elements likely reflecting a postoperative seroma.  The spinal cord is normal in signal and contour. The cord terminates normally at L1 . The nerve roots of the cauda equina and the filum terminale are normal.  The visualized portions of the SI joints are unremarkable.  The imaged intra-abdominal contents are unremarkable.  T12-L1: Broad disc osteophyte complex. Moderate spinal stenosis scratch them mild spinal stenosis. No significant foraminal stenosis.  L1-L2: Broad-based disc osteophyte  complex. Moderate spinal stenosis. No significant foraminal stenosis.  L2-L3: Broad-based disc osteophyte complex and facet arthropathy. Right foraminal stenosis. Mild spinal stenosis.  L3-L4: Interbody fusion. No evidence of neural foraminal stenosis. No central canal stenosis.  L4-L5: Broad-based disc osteophyte complex. No evidence of neural foraminal stenosis. No central canal stenosis.  L5-S1: Broad-based disc bulge. Moderate left foraminal stenosis. Mild right foraminal stenosis. No central canal stenosis.  IMPRESSION: Overall evaluation of the central canal and foramina is limited secondary to susceptibility artifact resulting from the extensive orthopedic hardware obscuring the adjacent soft tissue and osseous structures.  1. Posterior lumbar fusion from T11 through L5. Postsurgical fluid collection in the posterior surgical bed likely representing a seroma. 2. At T12-L1 there is a broad disc osteophyte complex. Moderate spinal stenosis scratch them mild spinal stenosis. No significant foraminal stenosis. 3. At L1-2 there is a broad based disc osteophyte complex. Moderate spinal stenosis. No significant foraminal stenosis. 4. At L2-3 there is a broad-based disc osteophyte complex and facet arthropathy. Right foraminal stenosis. Mild spinal stenosis. 5. At L5-S1 there is a broad-based disc bulge and moderate left foraminal stenosis.    By: Kathreen Devoid   On: 07/19/2015 13:40   He reports that he quit smoking about 21 years ago. He has a 25.00 pack-year smoking history. He has never used smokeless tobacco. No results for input(s): HGBA1C, LABURIC in the last 8760 hours.  Objective:  VS:  HT:     WT:    BMI:      BP:123/78   HR:80bpm   TEMP: ( )   RESP:  Physical Exam  Ortho Exam Imaging: Xr C-arm No Report  Result Date: 07/09/2019 Please see Notes tab for imaging impression.   Past Medical/Family/Surgical/Social History: Medications & Allergies reviewed  per EMR, new medications updated. 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[place]) 04/16/2013   Pre-op evaluation 04/06/2013   Essential hypertension 04/06/2013   Dyslipidemia 04/06/2013   Past Medical History:  Diagnosis Date   Anxiety    Arthritis    COPD (chronic obstructive pulmonary disease) (HCC)    Headache(784.0)    migraine   HTN (hypertension)    Hyperlipidemia    Prostate cancer ([place]) 2004   seed implant and beam radiation   Stroke ([place])    2010  TIA   TIA (transient ischemic attack) 06/2009   Family History  Problem Relation Age of Onset   Cancer Mother        lung and colon   Stroke Father    Cancer Maternal Grandfather        lung   Heart attack Brother    Hypertension Brother        hyperlipidemia   Past Surgical History:  Procedure Laterality Date   APPENDECTOMY  1952   RHINOPLASTY     TONSILLECTOMY  1950   TOTAL HIP ARTHROPLASTY Right 04/16/2013   Dr Ninfa Linden   TOTAL HIP ARTHROPLASTY Right 04/16/2013   Procedure: RIGHT TOTAL HIP ARTHROPLASTY ANTERIOR APPROACH;  Surgeon: Mcarthur Rossetti, MD;  Location: [facility];  Service: Orthopedics;  Laterality: Right;   Social History   Occupational History   Not on file  Tobacco Use   Smoking status: Former Smoker    Packs/day: 1.00    Years: 25.00    Pack years: 25.00    Quit date: 04/05/1998    Years since quitting: 21.2   Smokeless tobacco: Never Used  Substance and Sexual Activity   Alcohol use: Yes     Alcohol/week: 7.0 standard drinks    Types: 7 Shots of liquor per week    Comment: 6 drinks every day   Drug use: Yes    Types: Marijuana    Comment: less then 1/2 gm a day   Sexual activity: Not on file

## 2019-09-09 ENCOUNTER — Other Ambulatory Visit: Payer: Self-pay

## 2019-09-09 ENCOUNTER — Ambulatory Visit (HOSPITAL_COMMUNITY)
Admission: RE | Admit: 2019-09-09 | Discharge: 2019-09-09 | Disposition: A | Discharge status: Home / Self Care | Payer: Medicare Other | Source: Ambulatory Visit | Attending: Urology | Admitting: Urology

## 2019-09-09 DIAGNOSIS — C61 Malignant neoplasm of prostate: Secondary | ICD-10-CM | POA: Insufficient documentation

## 2019-09-09 LAB — CREATININE, SERUM
Creatinine, Ser: 1.24 mg/dL (ref 0.61–1.24)
GFR calc Af Amer: >60 mL/min (ref >60)
GFR calc non Af Amer: 56 mL/min — ABNORMAL LOW (ref >60)

## 2019-09-09 MED ORDER — GADOBUTROL 1 MMOL/ML IV SOLN
7.0000 mL | Freq: Once | INTRAVENOUS | Status: AC | PRN
  Administered 2019-09-09: 7 mL via INTRAVENOUS

## 2019-11-20 ENCOUNTER — Encounter: Payer: Self-pay | Admitting: Orthopaedic Surgery

## 2019-11-20 ENCOUNTER — Ambulatory Visit (INDEPENDENT_AMBULATORY_CARE_PROVIDER_SITE_OTHER): Payer: Medicare Other

## 2019-11-20 ENCOUNTER — Ambulatory Visit (INDEPENDENT_AMBULATORY_CARE_PROVIDER_SITE_OTHER): Payer: Medicare Other | Admitting: Orthopaedic Surgery

## 2019-11-20 ENCOUNTER — Other Ambulatory Visit: Payer: Self-pay

## 2019-11-20 DIAGNOSIS — M25561 Pain in right knee: Secondary | ICD-10-CM

## 2019-11-20 MED ORDER — LIDOCAINE HCL 1 % IJ SOLN
3.0000 mL | INTRAMUSCULAR | Status: AC | PRN
  Administered 2019-11-20: 3 mL

## 2019-11-20 MED ORDER — METHYLPREDNISOLONE ACETATE 40 MG/ML IJ SUSP
40.0000 mg | INTRAMUSCULAR | Status: AC | PRN
  Administered 2019-11-20: 40 mg via INTRA_ARTICULAR

## 2019-11-20 NOTE — Progress Notes (Signed)
Office Visit Note   Patient: Jesse Robbins           Date of Birth: [birth year]           MRN: [id number] Visit Date: 11/20/2019              Requested by: [name], MD [address] [place],  [place] [address] PCP: [name], MD   Assessment & Plan: Visit Diagnoses:  1. Acute pain of right knee     Plan: I was able to aspirate 40 to 50 cc of straw-colored fluid off the knee.  This does not seem to be consistent with gout.  This seems to be more of an inflammatory process.  There is arthritic changes on plain film of his knee.  He may have just flared something up and there is some synovitis associated with this.  I did place a steroid injection in his right knee without difficulty and he was able to move around better after this.  We had a long and thorough discussion about his knee.  All question concerns were answered and addressed.  Follow-up will be as needed.  If things worsen in any way he will let us know.  Follow-Up Instructions: Return if symptoms worsen or fail to improve.   Orders:  Orders Placed This Encounter  Procedures  . Large Joint Inj  . XR Knee 1-2 Views Right   No orders of the defined types were placed in this encounter.     Procedures: Large Joint Inj: R knee on 11/20/2019 3:35 PM Indications: diagnostic evaluation and pain Details: 22 G 1.5 in needle, superolateral approach  Arthrogram: No  Medications: 3 mL lidocaine 1 %; 40 mg methylPREDNISolone acetate 40 MG/ML Outcome: tolerated well, no immediate complications Procedure, treatment alternatives, risks and benefits explained, specific risks discussed. Consent was given by the patient. Immediately prior to procedure a time out was called to verify the correct patient, procedure, equipment, support staff and site/side marked as required. Patient was prepped and draped in the usual sterile fashion.       Clinical Data: No additional findings.   Subjective: Chief Complaint    Patient presents with  . Right Knee - Pain  The patient comes in today with acute right knee pain is gotten worse over a week.  He is 79 years old does not report any issues with that knee before other than an injury back in the early 70s.  We have replaced his right hip before about 6 years ago.  His right knee has become swollen.  He feels like he may have overdone it dancing to the radio.  He says the pain does wake him up at night and is not much pain when he is walking but he has more pain in that knee when he squats down.  He is not a diabetic.  HPI  Review of Systems He currently denies any headache, chest pain, shortness of breath, fever, chills, nausea, vomiting  Objective: Vital Signs: There were no vitals taken for this visit.  Physical Exam He is alert and orient x3 and in no acute distress Ortho Exam Examination of his right knee does show moderate effusion.  He does have prominence of the tibial tubercle.  There is varus malalignment with patellofemoral crepitation on range of motion.  The knee feels ligamentously stable.  It is warm but not red. Specialty Comments:  No specialty comments available.  Imaging: XR Knee 1-2 Views Right  Result  Date: 11/20/2019 2 views of the right knee show moderate effusion.  There is fragmentation of the tibial tubercle from a remote injury versus [employer].  There is patellofemoral arthritic changes that is quite severe.  The medial lateral compartments are well-maintained.    PMFS History: Patient Active Problem List   Diagnosis Date Noted  . Continuous chronic alcoholism ([place]) 12/05/2013  . CAFL (chronic airflow limitation) ([place]) 12/05/2013  . Acid reflux 12/05/2013  . H/O malignant neoplasm of prostate 12/05/2013  . History of repair of hip joint 12/05/2013  . H/O transient cerebral ischemia 12/05/2013  . Chronic obstructive pulmonary disease ([place]) 12/05/2013  . Idiopathic scoliosis and kyphoscoliosis 08/21/2013  .  Lumbar canal stenosis 08/21/2013  . 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[place]) 04/16/2013  . Pre-op evaluation 04/06/2013  . Essential hypertension 04/06/2013  . Dyslipidemia 04/06/2013   Past Medical History:  Diagnosis Date  . Anxiety   . Arthritis   . COPD (chronic obstructive pulmonary disease) ([place])   . Headache(784.0)    migraine  . HTN (hypertension)   . Hyperlipidemia   . 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 Stroke [facility])    2010 TIA  . TIA (transient ischemic attack) 06/2009    Family History  Problem Relation Age of Onset  . Cancer Mother        lung and colon  . Stroke Father   . Cancer Maternal Grandfather        lung  . Heart attack Brother   . Hypertension Brother        hyperlipidemia    Past Surgical History:  Procedure Laterality Date  . APPENDECTOMY  1952  . RHINOPLASTY    . TONSILLECTOMY  1950  . TOTAL HIP ARTHROPLASTY Right 04/16/2013   Dr Ninfa Linden  . TOTAL HIP ARTHROPLASTY Right 04/16/2013   Procedure: RIGHT TOTAL HIP ARTHROPLASTY ANTERIOR APPROACH;  Surgeon: Mcarthur Rossetti, MD;  Location: [facility];  Service: Orthopedics;  Laterality: Right;   Social History   Occupational History  . Not on file  Tobacco Use  . Smoking status: Former Smoker    Packs/day: 1.00    Years: 25.00    Pack years: 25.00    Quit date: 04/05/1998    Years since quitting: 21.6  . Smokeless tobacco: Never Used  Substance and Sexual Activity  . Alcohol use: Yes    Alcohol/week: 7.0 standard drinks    Types: 7 Shots of liquor per week    Comment: 6 drinks every day  . Drug use: Yes    Types: Marijuana    Comment: less then 1/2 gm a day  . Sexual activity: Not on file

## 2019-11-21 ENCOUNTER — Encounter (HOSPITAL_COMMUNITY): Payer: Medicare Other

## 2019-12-09 ENCOUNTER — Other Ambulatory Visit: Payer: Self-pay

## 2019-12-09 ENCOUNTER — Other Ambulatory Visit (HOSPITAL_COMMUNITY): Payer: Self-pay | Admitting: Family Medicine

## 2019-12-09 ENCOUNTER — Ambulatory Visit (HOSPITAL_COMMUNITY)
Admission: RE | Admit: 2019-12-09 | Discharge: 2019-12-09 | Disposition: A | Discharge status: Home / Self Care | Payer: Medicare Other | Source: Ambulatory Visit | Attending: Surgery | Admitting: Surgery

## 2019-12-09 DIAGNOSIS — R0989 Other specified symptoms and signs involving the circulatory and respiratory systems: Secondary | ICD-10-CM

## 2020-06-03 ENCOUNTER — Encounter: Payer: Self-pay | Admitting: Gastroenterology

## 2020-06-04 ENCOUNTER — Encounter: Payer: Self-pay | Admitting: Gastroenterology

## 2020-06-16 ENCOUNTER — Other Ambulatory Visit (HOSPITAL_COMMUNITY): Payer: Self-pay | Admitting: Family Medicine

## 2020-06-16 DIAGNOSIS — I63231 Cerebral infarction due to unspecified occlusion or stenosis of right carotid arteries: Secondary | ICD-10-CM

## 2020-06-18 ENCOUNTER — Other Ambulatory Visit: Payer: Self-pay

## 2020-06-18 ENCOUNTER — Ambulatory Visit (HOSPITAL_COMMUNITY)
Admission: RE | Admit: 2020-06-18 | Discharge: 2020-06-18 | Disposition: A | Discharge status: Home / Self Care | Payer: Medicare Other | Source: Ambulatory Visit | Attending: Family Medicine | Admitting: Family Medicine

## 2020-06-18 DIAGNOSIS — I63231 Cerebral infarction due to unspecified occlusion or stenosis of right carotid arteries: Secondary | ICD-10-CM

## 2020-07-07 ENCOUNTER — Other Ambulatory Visit (INDEPENDENT_AMBULATORY_CARE_PROVIDER_SITE_OTHER): Payer: Medicare Other

## 2020-07-07 ENCOUNTER — Ambulatory Visit (INDEPENDENT_AMBULATORY_CARE_PROVIDER_SITE_OTHER): Payer: Medicare Other | Admitting: Gastroenterology

## 2020-07-07 ENCOUNTER — Encounter: Payer: Self-pay | Admitting: Gastroenterology

## 2020-07-07 VITALS — BP 122/72 | HR 75 | Ht 67.0 in | Wt 158.5 lb

## 2020-07-07 DIAGNOSIS — Z7289 Other problems related to lifestyle: Secondary | ICD-10-CM | POA: Diagnosis not present

## 2020-07-07 DIAGNOSIS — D509 Iron deficiency anemia, unspecified: Secondary | ICD-10-CM

## 2020-07-07 DIAGNOSIS — Z79899 Other long term (current) drug therapy: Secondary | ICD-10-CM | POA: Diagnosis not present

## 2020-07-07 DIAGNOSIS — K219 Gastro-esophageal reflux disease without esophagitis: Secondary | ICD-10-CM

## 2020-07-07 DIAGNOSIS — I63231 Cerebral infarction due to unspecified occlusion or stenosis of right carotid arteries: Secondary | ICD-10-CM

## 2020-07-07 DIAGNOSIS — Z800 Family history of malignant neoplasm of digestive organs: Secondary | ICD-10-CM

## 2020-07-07 DIAGNOSIS — Z789 Other specified health status: Secondary | ICD-10-CM

## 2020-07-07 LAB — CBC WITH DIFFERENTIAL/PLATELET
Basophils Absolute: 0 10*3/uL (ref 0.0–0.1)
Basophils Relative: 0.6 % (ref 0.0–3.0)
Eosinophils Absolute: 0.2 10*3/uL (ref 0.0–0.7)
Eosinophils Relative: 3.1 % (ref 0.0–5.0)
HCT: 38.8 % — ABNORMAL LOW (ref 39.0–52.0)
Hemoglobin: 13 g/dL (ref 13.0–17.0)
Lymphocytes Relative: 34.8 % (ref 12.0–46.0)
Lymphs Abs: 2 10*3/uL (ref 0.7–4.0)
MCHC: 33.5 g/dL (ref 30.0–36.0)
MCV: 94.5 fl (ref 78.0–100.0)
Monocytes Absolute: 0.5 10*3/uL (ref 0.1–1.0)
Monocytes Relative: 8.3 % (ref 3.0–12.0)
Neutro Abs: 3.1 10*3/uL (ref 1.4–7.7)
Neutrophils Relative %: 53.2 % (ref 43.0–77.0)
Platelets: 255 10*3/uL (ref 150.0–400.0)
RBC: 4.1 Mil/uL — ABNORMAL LOW (ref 4.22–5.81)
RDW: 20.4 % — ABNORMAL HIGH (ref 11.5–15.5)
WBC: 5.8 10*3/uL (ref 4.0–10.5)

## 2020-07-07 MED ORDER — SUTAB 1479-225-188 MG PO TABS: 1.0000 | ORAL_TABLET | Freq: Once | ORAL | 0 refills | Status: AC

## 2020-07-07 NOTE — Patient Instructions (Signed)
If you are age 77 or older, your body mass index should be between 23-30. Your Body mass index is 24.82 kg/m. If this is out of the aforementioned range listed, please consider follow up with your Primary Care Provider.  If you are age 46 or younger, your body mass index should be between 19-25. Your Body mass index is 24.82 kg/m. If this is out of the aformentioned range listed, please consider follow up with your Primary Care Provider.   Please go to the lab in the basement of our building to have lab work done as you leave today. Hit "B" for basement when you get on the elevator.  When the doors open the lab is on your left.  We will call you with the results. Thank you.  You have been scheduled for an endoscopy and colonoscopy. Please follow the written instructions given to you at your visit today. Please pick up your prep supplies at the pharmacy within the next 1-3 days. If you use inhalers (even only as needed), please bring them with you on the day of your procedure.  Please hold all NSAIDs.  Try to reduce your tylenol use. Continue Omeprazole and oral iron.   Thank you for entrusting me with your care and for choosing [facility], Dr.  Cellar

## 2020-07-07 NOTE — Progress Notes (Signed)
HPI :  79 year old male with a history of COPD, alcohol use, chronic back pain, history of prostate cancer status post XRT in 2004, history of TIA in 2010, referred by Dr. [name] for iron deficiency anemia.  Patient states he has had history of intermittent anemia in the past.  On August 14 he had a hemoglobin of 11.5, MCV 89, iron sat of 7% with a ferritin of 8 consistent with iron deficiency.  At that point time he was taking 400 mg of ibuprofen 3 times daily for chronic back pain.  He was also taking aspirin 81 mg a day for history of TIA and subsequent prevention.  He was told to stop all ibuprofen.  He had been taking omeprazole 20 mg twice daily for chronic reflux.  He denies any blood in his stools at all.  He passes brown stool.  Occasional has a loose stool, no significant bowel changes recently.  He denies any abdominal pains on a routine basis.  He states the omeprazole controls his reflux very well.  He has occasional breakthrough for which she will take a Tums as needed.  He denies any dysphagia.  No nausea or vomiting, no postprandial symptoms.  No weight loss.  He states he has drank heavily for the past 15 years or so.  Drinks about 10 ounces a day of 100 proof liquor per day.  He has no history of cirrhosis that is known.  Platelets normal, only mild AST elevation.  States he has detox planned once Covid is under better control he is going to check into a rehab center.  For his back pain he has been taking a lot of Tylenol lately since he cannot take NSAIDs.  He is averages about 3 g of Tylenol per day.  We discussed risks of liver injury at that dosing.  His mother was diagnosed with colon cancer at age less than 46.  He denies any other known family history of GI tract malignancy.  His last colonoscopy was in 2017 with Dr. Arville Go off, diverticulosis noted.  Also was reported to have a poor prep.  He is never had a prior EGD.  He does have COPD, denies any supplemental oxygen  use.  Denies any cardiopulmonary symptoms at this time.  He had a TIA in 2010.  He had prostate cancer treated with radiation several years ago.  Denies any bowel symptoms related to radiation over the years.  His father died of a GI bleed, his brother was recently diagnosed with peptic ulcer disease and associated anemia.  Prior workup: Colonoscopy 2017 - Dr. Earlean Shawl - diverticulosis - POOR PREP   Labs 05-30-20 Iron 25, iron sat 7%, ferritin 8 AP 35, AST 36, ALT 23,  Hgb 11.5, MCV 89, plt 273, WBC 5.9   Past Medical History:  Diagnosis Date  . Alcohol abuse   . Anemia   . Anxiety   . Arthritis   . Cervical spinal stenosis   . Chronic gastritis   . COPD (chronic obstructive pulmonary disease) ([place])   . Depression   . Diverticulosis   . Headache(784.0)    migraine  . HTN (hypertension)   . Hyperlipidemia   . Lumbar spinal stenosis   . Prostate cancer ([place]) 2004   seed implant and beam radiation  . Stroke [facility])    2010 TIA  . TIA (transient ischemic attack) 06/2009     Past Surgical History:  Procedure Laterality Date  . APPENDECTOMY  1952  .  COLONOSCOPY     dr Earlean Shawl around 2017  . RHINOPLASTY    . TONSILLECTOMY  1950  . TOTAL HIP ARTHROPLASTY Right 04/16/2013   Dr Ninfa Linden  . TOTAL HIP ARTHROPLASTY Right 04/16/2013   Procedure: RIGHT TOTAL HIP ARTHROPLASTY ANTERIOR APPROACH;  Surgeon: Mcarthur Rossetti, MD;  Location: [facility];  Service: Orthopedics;  Laterality: Right;   Family History  Problem Relation Age of Onset  . Cancer Mother        lung and colon  . Stroke Father   . Prostate cancer Father   . Cancer Maternal Grandfather        lung  . Heart attack Brother   . Hypertension Brother        hyperlipidemia  . Esophageal cancer Neg Hx    Social History   Tobacco Use  . Smoking status: Former Smoker    Packs/day: 1.00    Years: 25.00    Pack years: 25.00    Quit date: 04/05/1998    Years since quitting: 22.2  . Smokeless tobacco: Never Used    Vaping Use  . Vaping Use: Never used  Substance Use Topics  . Alcohol use: Yes    Alcohol/week: 7.0 standard drinks    Types: 7 Shots of liquor per week    Comment: 6 drinks every day  . Drug use: Yes    Types: Marijuana    Comment: less then 1/2 gm a day   Current Outpatient Medications  Medication Sig Dispense Refill  . Acetaminophen (TYLENOL PO) Take 3 tablets by mouth 2 (two) times daily.    Marland Kitchen albuterol (PROVENTIL HFA;VENTOLIN HFA) 108 (90 Base) MCG/ACT inhaler Inhale 2 puffs into the lungs every 4 (four) hours as needed.     Marland Kitchen ascorbic acid (VITAMIN C) 500 MG tablet Take 500 mg by mouth daily.    . ASPIRIN 81 PO Take 1 tablet by mouth at bedtime.    Marland Kitchen b complex vitamins capsule Take 1 capsule by mouth daily.     . budesonide-formoterol (SYMBICORT) 160-4.5 MCG/ACT inhaler Inhale 2 puffs into the lungs 2 (two) times daily.     . cholecalciferol (VITAMIN D) 1000 UNITS tablet Take 2,000 Units by mouth daily.     . clonazePAM (KLONOPIN) 1 MG tablet Take 1 mg by mouth at bedtime.     . Cyanocobalamin (VITAMIN B-12) 5000 MCG TBDP Take by mouth.    . ferrous sulfate 325 (65 FE) MG tablet Take 1 tablet (325 mg total) by mouth 3 (three) times daily after meals. (Patient taking differently: Take 325 mg by mouth daily. ) 90 tablet 0  . fluticasone (FLONASE) 50 MCG/ACT nasal spray Place 2 sprays into the nose daily.    . furosemide (LASIX) 20 MG tablet Take 20 mg by mouth daily.    Marland Kitchen gabapentin (NEURONTIN) 600 MG tablet Take 600 mg by mouth 4 (four) times daily.    Marland Kitchen latanoprost (XALATAN) 0.005 % ophthalmic solution Place 1 drop into both eyes at bedtime.    Marland Kitchen losartan (COZAAR) 100 MG tablet Take 100 mg by mouth daily.    . magnesium oxide (MAG-OX) 400 MG tablet Take 400 mg by mouth daily.    . Multiple Vitamins-Minerals (CENTRUM ADULTS PO) Take by mouth daily.    Marland Kitchen NASAL SALINE NA Place 2 sprays into both nostrils as needed (sinus).     . Omega-3 Fatty Acids (FISH OIL PO) Take 1 tablet by  mouth at bedtime.    Marland Kitchen omeprazole (  PRILOSEC) 20 MG capsule Take 20 mg by mouth 2 (two) times daily.    Marland Kitchen oxymetazoline (AFRIN) 0.05 % nasal spray Place 2 sprays into the nose daily as needed.     . Probiotic Product (PROBIOTIC-10 PO) Take by mouth daily.    Marland Kitchen terazosin (HYTRIN) 2 MG capsule Take 2 mg by mouth. 4 tablets at night     Current Facility-Administered Medications  Medication Dose Route Frequency Provider Last Rate Last Admin  . bupivacaine (MARCAINE) 0.5 % (with pres) injection 3 mL  3 mL Other Once [name], MD      . methylPREDNISolone acetate (DEPO-MEDROL) injection 80 mg  80 mg Other Once [name], MD       Allergies  Allergen Reactions  . Codeine Sulfate [Codeine]   . Penicillins     unknown  . Persantine [Dipyridamole] Other (See Comments)    Patient cannot recall reaction, perhaps "mental fog"  . Tramadol Other (See Comments)    irritability  . Adhesive [Tape] Rash  . Hydrocodone      Review of Systems: All systems reviewed and negative except where noted in HPI.   Recent labs in HPI  Physical Exam: BP 122/72   Pulse 75   Ht 5\' 7"  (1.702 m)   Wt 158 lb 8 oz (71.9 kg)   BMI 24.82 kg/m  Constitutional: Pleasant,well-developed, male in no acute distress. HEENT: Normocephalic and atraumatic. Conjunctivae are normal. No scleral icterus. Neck supple.  Cardiovascular: Normal rate, regular rhythm.  Pulmonary/chest: Effort normal and breath sounds normal. No wheezing, rales or rhonchi. Abdominal: Soft, nondistended, nontender.There are no masses palpable.  Extremities: no edema Lymphadenopathy: No cervical adenopathy noted. Neurological: Alert and oriented to person place and time. Skin: Skin is warm and dry. No rashes noted. Psychiatric: Normal mood and affect. Behavior is normal.   ASSESSMENT AND PLAN: 79 year old male here for new patient assessment of the following:  Iron deficiency anemia / high risk medication use / family history of  CRC - patient has developed an iron deficiency anemia in the setting of high-dose NSAID use plus aspirin.  No overt blood loss. He is taking omeprazole for GERD, but discussed with him how NSAIDs can cause ulceration in all parts of the GI tract, and that is certainly on the differential.  We discussed full differential diagnosis for iron deficiency anemia.  His last colonoscopy was in 2017 and did not show any high risk lesions however poor prep noted, in light of iron deficiency anemia and his family history of colon cancer, colonoscopy is recommended.  I am also recommending an upper endoscopy to clear his upper GI tract in light of iron deficiency and will also screen for Barrett's in light of his chronic GERD.  I discussed risks and benefits of anesthesia and endoscopy and colonoscopy with him, and he wanted to proceed.  We will plan on using a double prep for a colonoscopy given the poor prep on his last exam.  He is taking iron at this time and should continue to do so, we will repeat a CBC today.  I suspect he is normocytic in setting of the iron deficiency due to his chronic alcohol use.  We had a lengthy discussion about his NSAID use.  Long-term would recommend he avoid the dosing he was at previously due to risk of peptic ulcer disease, renal disease, cardiovascular disease etc.  He will continue to hold the NSAID but continue baby aspirin in light of his history of  TIA.  We will repeat CBC today to ensure hemoglobin is stable.  Further recommendations pending the results of his procedures and his course.  Alcohol abuse - we discussed his history of alcohol use, this could be contributing to his anemia.  He is planning on going into rehab for alcohol abuse once Covid is under better control.  I recommend he reduce his Tylenol use in light of his alcohol use, as this will increase his risk for Tylenol toxicity.  Recommend < 2 g a day of Tylenol if he continues to drink alcohol, ideally less if he can  tolerate it, he is currently at 3 g a day.  He agreed  GERD - well-controlled on omeprazole 20 mg twice daily.  We will pursue EGD for evaluation of iron deficiency and to screen for Barrett's esophagus as he meets criteria for that as well.  He agreed  Almena Cellar, MD [facility]  CC: [name], MD

## 2020-07-17 ENCOUNTER — Ambulatory Visit (AMBULATORY_SURGERY_CENTER): Payer: Medicare Other | Admitting: Gastroenterology

## 2020-07-17 ENCOUNTER — Encounter: Payer: Self-pay | Admitting: Gastroenterology

## 2020-07-17 ENCOUNTER — Other Ambulatory Visit: Payer: Self-pay

## 2020-07-17 VITALS — BP 171/83 | HR 94 | Temp 98.4°F | Resp 24 | Ht 67.0 in | Wt 158.0 lb

## 2020-07-17 DIAGNOSIS — K319 Disease of stomach and duodenum, unspecified: Secondary | ICD-10-CM | POA: Diagnosis not present

## 2020-07-17 DIAGNOSIS — Z800 Family history of malignant neoplasm of digestive organs: Secondary | ICD-10-CM

## 2020-07-17 DIAGNOSIS — K449 Diaphragmatic hernia without obstruction or gangrene: Secondary | ICD-10-CM

## 2020-07-17 DIAGNOSIS — K649 Unspecified hemorrhoids: Secondary | ICD-10-CM | POA: Diagnosis not present

## 2020-07-17 DIAGNOSIS — D509 Iron deficiency anemia, unspecified: Secondary | ICD-10-CM | POA: Diagnosis present

## 2020-07-17 DIAGNOSIS — D120 Benign neoplasm of cecum: Secondary | ICD-10-CM

## 2020-07-17 DIAGNOSIS — K259 Gastric ulcer, unspecified as acute or chronic, without hemorrhage or perforation: Secondary | ICD-10-CM

## 2020-07-17 DIAGNOSIS — K2270 Barrett's esophagus without dysplasia: Secondary | ICD-10-CM

## 2020-07-17 DIAGNOSIS — D123 Benign neoplasm of transverse colon: Secondary | ICD-10-CM | POA: Diagnosis not present

## 2020-07-17 DIAGNOSIS — D128 Benign neoplasm of rectum: Secondary | ICD-10-CM

## 2020-07-17 DIAGNOSIS — D122 Benign neoplasm of ascending colon: Secondary | ICD-10-CM | POA: Diagnosis not present

## 2020-07-17 DIAGNOSIS — K219 Gastro-esophageal reflux disease without esophagitis: Secondary | ICD-10-CM | POA: Diagnosis not present

## 2020-07-17 MED ORDER — SODIUM CHLORIDE 0.9 % IV SOLN: 500.0000 mL | INTRAVENOUS | Status: DC

## 2020-07-17 MED ORDER — OMEPRAZOLE 40 MG PO CPDR: 40.0000 mg | DELAYED_RELEASE_CAPSULE | Freq: Two times a day (BID) | ORAL | 1 refills | Status: DC

## 2020-07-17 NOTE — Op Note (Signed)
[facility] Patient Name: Jesse Robbins Procedure Date: 07/17/2020 1:18 PM MRN: [id number] Endoscopist: Remo Lipps P. Havery Moros , MD Age: 79 Referring MD:  Date of Birth: 02-01-1941 Gender: Male Account #: [id number] Procedure:                Colonoscopy Indications:              Iron deficiency anemia, mother with colon cancer                            diagnosed age < 50 Medicines:                Monitored Anesthesia Care Procedure:                Pre-Anesthesia Assessment:                           - Prior to the procedure, a History and Physical                            was performed, and patient medications and                            allergies were reviewed. The patient's tolerance of                            previous anesthesia was also reviewed. The risks                            and benefits of the procedure and the sedation                            options and risks were discussed with the patient.                            All questions were answered, and informed consent                            was obtained. Prior Anticoagulants: The patient has                            taken no previous anticoagulant or antiplatelet                            agents. ASA Grade Assessment: III - A patient with                            severe systemic disease. After reviewing the risks                            and benefits, the patient was deemed in                            satisfactory condition to undergo the procedure.  After obtaining informed consent, the colonoscope                            was passed under direct vision. Throughout the                            procedure, the patient's blood pressure, pulse, and                            oxygen saturations were monitored continuously. The                            Colonoscope was introduced through the anus and                            advanced to the the terminal ileum, with                             identification of the appendiceal orifice and IC                            valve. The colonoscopy was performed without                            difficulty. The patient tolerated the procedure                            well. The quality of the bowel preparation was                            adequate. The terminal ileum, ileocecal valve,                            appendiceal orifice, and rectum were photographed. Scope In: 1:53:47 PM Scope Out: 2:28:34 PM Scope Withdrawal Time: 0 hours 29 minutes 42 seconds  Total Procedure Duration: 0 hours 34 minutes 47 seconds  Findings:                 The perianal and digital rectal examinations were                            normal.                           The terminal ileum appeared normal.                           A 6 to 7 mm polyp was found in the cecum. The polyp                            was flat and quite subtle, borders hard to                            appreciated. The polyp was removed with a cold  snare. Resection and retrieval were complete.                           A diminutive polyp was found in the ascending                            colon. The polyp was sessile. The polyp was removed                            with a cold snare. Resection and retrieval were                            complete.                           A 6 mm polyp was found in the transverse colon. The                            polyp was flat. The polyp was removed with a cold                            snare. Resection and retrieval were complete.                           A few small-mouthed diverticula were found in the                            sigmoid colon.                           Two sessile polyps were found in the rectum. The                            polyps were 3 mm in size. These polyps were removed                            with a cold snare. Resection and retrieval were                             complete.                           There was significant spasm in the entire colon                            with looping of the right colon which prolonged the                            exam.                           Internal hemorrhoids were found during retroflexion.  The exam was otherwise without abnormality. Complications:            No immediate complications. Estimated blood loss:                            Minimal. Estimated Blood Loss:     Estimated blood loss was minimal. Impression:               - The examined portion of the ileum was normal.                           - One 6 to 7 mm polyp in the cecum, removed with a                            cold snare. Resected and retrieved.                           - One diminutive polyp in the ascending colon,                            removed with a cold snare. Resected and retrieved.                           - One 6 mm polyp in the transverse colon, removed                            with a cold snare. Resected and retrieved.                           - Diverticulosis in the sigmoid colon.                           - Two 3 mm polyps in the rectum, removed with a                            cold snare. Resected and retrieved.                           - Colonic spasm.                           - Internal hemorrhoids.                           - The examination was otherwise normal.                           No cause for iron deficiency on colonoscopy, which                            is likely due to gastritis / ulcer on EGD. Recommendation:           - Patient has a contact number available for  emergencies. The signs and symptoms of potential                            delayed complications were discussed with the                            patient. Return to normal activities tomorrow.                            Written discharge instructions were provided to  the                            patient.                           - Resume previous diet.                           - Continue present medications.                           - Await pathology results. Remo Lipps P. Lacrisha Bielicki, MD 07/17/2020 2:36:04 PM This report has been signed electronically.

## 2020-07-17 NOTE — Progress Notes (Signed)
VS by CW  Pt's states no medical or surgical changes since previsit or office visit.  

## 2020-07-17 NOTE — Patient Instructions (Signed)
Handout on polyps, diverticulosis and gastritis given. Omeprazole called into pharmacy.  Avoid NSAIDS other then baby aspirin ( history of CVA)     YOU HAD AN ENDOSCOPIC PROCEDURE TODAY AT [facility]:   Refer to the procedure report that was given to you for any specific questions about what was found during the examination.  If the procedure report does not answer your questions, please call your gastroenterologist to clarify.  If you requested that your care partner not be given the details of your procedure findings, then the procedure report has been included in a sealed envelope for you to review at your convenience later.  YOU SHOULD EXPECT: Some feelings of bloating in the abdomen. Passage of more gas than usual.  Walking can help get rid of the air that was put into your GI tract during the procedure and reduce the bloating. If you had a lower endoscopy (such as a colonoscopy or flexible sigmoidoscopy) you may notice spotting of blood in your stool or on the toilet paper. If you underwent a bowel prep for your procedure, you may not have a normal bowel movement for a few days.  Please Note:  You might notice some irritation and congestion in your nose or some drainage.  This is from the oxygen used during your procedure.  There is no need for concern and it should clear up in a day or so.  SYMPTOMS TO REPORT IMMEDIATELY:   Following lower endoscopy (colonoscopy or flexible sigmoidoscopy):  Excessive amounts of blood in the stool  Significant tenderness or worsening of abdominal pains  Swelling of the abdomen that is new, acute  Fever of 100F or higher   Following upper endoscopy (EGD)  Vomiting of blood or coffee ground material  New chest pain or pain under the shoulder blades  Painful or persistently difficult swallowing  New shortness of breath  Fever of 100F or higher  Black, tarry-looking stools  For urgent or emergent issues, a gastroenterologist can be  reached at any hour by calling [phone]. Do not use MyChart messaging for urgent concerns.    DIET:  We do recommend a small meal at first, but then you may proceed to your regular diet.  Drink plenty of fluids but you should avoid alcoholic beverages for 24 hours.  ACTIVITY:  You should plan to take it easy for the rest of today and you should NOT DRIVE or use heavy machinery until tomorrow (because of the sedation medicines used during the test).    FOLLOW UP: Our staff will call the number listed on your records 48-72 hours following your procedure to check on you and address any questions or concerns that you may have regarding the information given to you following your procedure. If we do not reach you, we will leave a message.  We will attempt to reach you two times.  During this call, we will ask if you have developed any symptoms of COVID 19. If you develop any symptoms (ie: fever, flu-like symptoms, shortness of breath, cough etc.) before then, please call [phone].  If you test positive for Covid 19 in the 2 weeks post procedure, please call and report this information to [place].    If any biopsies were taken you will be contacted by phone or by letter within the next 1-3 weeks.  Please call us at [phone] if you have not heard about the biopsies in 3 weeks.    SIGNATURES/CONFIDENTIALITY: You and/or your care  partner have signed paperwork which will be entered into your electronic medical record.  These signatures attest to the fact that that the information above on your After Visit Summary has been reviewed and is understood.  Full responsibility of the confidentiality of this discharge information lies with you and/or your care-partner.

## 2020-07-17 NOTE — Progress Notes (Signed)
Called to room to assist during endoscopic procedure.  Patient ID and intended procedure confirmed with present staff. Received instructions for my participation in the procedure from the performing physician.  

## 2020-07-17 NOTE — Progress Notes (Signed)
pt tolerated well. VSS. awake and to recovery. Report given to RN. Bite block inserted and removed without trauma. 

## 2020-07-17 NOTE — Op Note (Signed)
[facility] Patient Name: Jesse Robbins Procedure Date: 07/17/2020 1:18 PM MRN: [id number] Endoscopist: Jesse Robbins , MD Age: 79 Referring MD:  Date of Birth: 1941-07-09 Gender: Male Account #: [id number] Procedure:                Upper GI endoscopy Indications:              Iron deficiency anemia, history of GERD on                            omeprazole 20mg  twice daily, previously was using                            high dose ibuprofen along with baby aspirin, has                            since stopped ibuprofen, on iron with resolution of                            anemia Medicines:                Monitored Anesthesia Care Procedure:                Pre-Anesthesia Assessment:                           - Prior to the procedure, a History and Physical                            was performed, and patient medications and                            allergies were reviewed. The patient's tolerance of                            previous anesthesia was also reviewed. The risks                            and benefits of the procedure and the sedation                            options and risks were discussed with the patient.                            All questions were answered, and informed consent                            was obtained. Prior Anticoagulants: The patient has                            taken no previous anticoagulant or antiplatelet                            agents. ASA Grade Assessment: III - A patient with  severe systemic disease. After reviewing the risks                            and benefits, the patient was deemed in                            satisfactory condition to undergo the procedure.                           After obtaining informed consent, the endoscope was                            passed under direct vision. Throughout the                            procedure, the patient's blood pressure, pulse, and                             oxygen saturations were monitored continuously. The                            Endoscope was introduced through the mouth, and                            advanced to the second part of duodenum. The upper                            GI endoscopy was accomplished without difficulty.                            The patient tolerated the procedure well. Scope In: Scope Out: Findings:                 Esophagogastric landmarks were identified: the                            Z-line was found at 37 cm, the gastroesophageal                            junction was found at 37 cm and the upper extent of                            the gastric folds was found at 40 cm from the                            incisors.                           A 3 cm hiatal hernia was present.                           The Z-line was irregular and was found 37 cm from  the incisors. Biopsies were taken with a cold                            forceps for histology.                           The exam of the esophagus was otherwise normal.                           One non-bleeding cratered gastric ulcer with no                            stigmata of bleeding was found in the gastric                            antrum. The lesion was 4-5 mm in largest dimension.                            Biopsies were taken with a cold forceps for                            histology from the periphery of the lesion.                           Localized moderate inflammation characterized by                            erosions, erythema and friability was found in the                            gastric body.                           The exam of the stomach was otherwise normal.                           Biopsies were taken with a cold forceps in the                            gastric body, at the incisura and in the gastric                            antrum for Helicobacter pylori testing.                            The duodenal bulb and second portion of the                            duodenum were normal. Complications:            No immediate complications. Estimated blood loss:                            Minimal. Estimated Blood Loss:     Estimated blood loss was minimal.  Impression:               - Esophagogastric landmarks identified.                           - 3 cm hiatal hernia.                           - Z-line irregular, 37 cm from the incisors.                            Biopsied.                           - Non-bleeding gastric ulcer with no stigmata of                            bleeding. Biopsied.                           - Gastritis.                           - Normal duodenal bulb and second portion of the                            duodenum.                           - Biopsies were taken with a cold forceps for                            Helicobacter pylori testing.                           Suspect NSAIDs + aspirin caused ulcer / gastritis                            which is likely cause for iron deficiency but will                            rule out H pylori. Recommendation:           - Patient has a contact number available for                            emergencies. The signs and symptoms of potential                            delayed complications were discussed with the                            patient. Return to normal activities tomorrow.                            Written discharge instructions were provided to the  patient.                           - Resume previous diet.                           - Continue present medications.                           - Await pathology results.                           - Increase omeprazole to 40mg  twice daily for 6                            weeks, then reduce to prior dosing. Continue to                            avoid NSAIDs other than baby aspirin (history of                             CVA) Jesse Lipps P. Jesse Pilley, MD 07/17/2020 2:41:49 PM This report has been signed electronically.

## 2020-07-21 ENCOUNTER — Telehealth: Payer: Self-pay

## 2020-07-21 NOTE — Telephone Encounter (Signed)
First post procedure follow up call, no answer 

## 2020-07-21 NOTE — Telephone Encounter (Signed)
Second post procedure follow up call, no answer 

## 2020-07-22 ENCOUNTER — Other Ambulatory Visit: Payer: Self-pay | Admitting: Urology

## 2020-07-22 DIAGNOSIS — C61 Malignant neoplasm of prostate: Secondary | ICD-10-CM

## 2020-08-04 ENCOUNTER — Ambulatory Visit (HOSPITAL_COMMUNITY)
Admission: RE | Admit: 2020-08-04 | Discharge: 2020-08-04 | Disposition: A | Discharge status: Home / Self Care | Payer: Medicare Other | Source: Ambulatory Visit | Attending: Urology | Admitting: Urology

## 2020-08-04 ENCOUNTER — Other Ambulatory Visit: Payer: Self-pay

## 2020-08-04 DIAGNOSIS — C61 Malignant neoplasm of prostate: Secondary | ICD-10-CM | POA: Diagnosis not present

## 2020-08-04 MED ORDER — GADOBUTROL 1 MMOL/ML IV SOLN
7.0000 mL | Freq: Once | INTRAVENOUS | Status: AC | PRN
  Administered 2020-08-04: 7 mL via INTRAVENOUS

## 2020-10-13 ENCOUNTER — Other Ambulatory Visit: Payer: Self-pay

## 2020-10-13 MED ORDER — OMEPRAZOLE 40 MG PO CPDR: 40.0000 mg | DELAYED_RELEASE_CAPSULE | Freq: Every day | ORAL | 1 refills | Status: DC

## 2020-10-13 NOTE — Progress Notes (Signed)
Refill request rec'd via fax from [facility]

## 2020-10-23 ENCOUNTER — Ambulatory Visit (INDEPENDENT_AMBULATORY_CARE_PROVIDER_SITE_OTHER): Payer: Medicare Other | Admitting: Gastroenterology

## 2020-10-23 ENCOUNTER — Encounter: Payer: Self-pay | Admitting: Gastroenterology

## 2020-10-23 VITALS — BP 130/70 | HR 74 | Ht 67.0 in | Wt 165.6 lb

## 2020-10-23 DIAGNOSIS — Z789 Other specified health status: Secondary | ICD-10-CM

## 2020-10-23 DIAGNOSIS — K2270 Barrett's esophagus without dysplasia: Secondary | ICD-10-CM

## 2020-10-23 DIAGNOSIS — D509 Iron deficiency anemia, unspecified: Secondary | ICD-10-CM

## 2020-10-23 DIAGNOSIS — Z7289 Other problems related to lifestyle: Secondary | ICD-10-CM | POA: Diagnosis not present

## 2020-10-23 DIAGNOSIS — Z86010 Personal history of colonic polyps: Secondary | ICD-10-CM

## 2020-10-23 DIAGNOSIS — K253 Acute gastric ulcer without hemorrhage or perforation: Secondary | ICD-10-CM | POA: Diagnosis not present

## 2020-10-23 NOTE — Progress Notes (Signed)
HPI :  80 year old male with a history of COPD, chronic alcohol use, history of TIA, iron deficiency anemia, gastric ulcers, colon polyps, here for a follow up visit.   I last saw him in October for an EGD and colonoscopy.  He presented with iron deficiency anemia this past August in the setting of routine use of ibuprofen roughly 3 times a day for chronic back pain as well as history of baby aspirin use for TIA and subsequent prevention.  At that time he was taking omeprazole 20 mg once to twice a day.  This is also in the setting of heavy alcohol use.  No known history of cirrhosis.  We proceeded with EGD and colonoscopy with results as outlined below:  EGD 07/17/20 -  - A 3 cm hiatal hernia was present. - The Z-line was irregular and was found 37 cm from the incisors. Biopsies were taken with a cold forceps for histology. - The exam of the esophagus was otherwise normal. - One non-bleeding cratered gastric ulcer with no stigmata of bleeding was found in the gastric antrum. The lesion was 4-5 mm in largest dimension. Biopsies were taken with a cold forceps for histology from the periphery of the lesion. - Localized moderate inflammation characterized by erosions, erythema and friability was found in the gastric body. - The exam of the stomach was otherwise normal. - Biopsies were taken with a cold forceps in the gastric body, at the incisura and in the gastric antrum for Helicobacter pylori testing. - The duodenal bulb and second portion of the duodenum were normal.  Colonoscopy 07/17/20 - The perianal and digital rectal examinations were normal. - The terminal ileum appeared normal. - A 6 to 7 mm polyp was found in the cecum. The polyp was flat and quite subtle, borders hard to appreciated. The polyp was removed with a cold snare. Resection and retrieval were complete. - A diminutive polyp was found in the ascending colon. The polyp was sessile. The polyp was removed with a cold snare.  Resection and retrieval were complete. - A 6 mm polyp was found in the transverse colon. The polyp was flat. The polyp was removed with a cold snare. Resection and retrieval were complete. - A few small-mouthed diverticula were found in the sigmoid colon. - Two sessile polyps were found in the rectum. The polyps were 3 mm in size. These polyps were removed with a cold snare. Resection and retrieval were complete. - There was significant spasm in the entire colon with looping of the right colon which prolonged the exam. - Internal hemorrhoids were found during retroflexion.  1. Surgical [P], gastric antrum and gastric body - ANTRAL MUCOSA WITH HYPEREMIA. - DIFFERENTIAL INCLUDES PORTAL GASTROPATHY. [place] NEGATIVE FOR HELICOBACTER PYLORI. - NO INTESTINAL METAPLASIA, DYSPLASIA OR CARCINOMA. 2. Surgical [P], gastric ulcer - ANTRAL AND OXYNTIC MUCOSA WITH FOCAL EROSION AND HYPEREMIA. - WARTHIN-STARRY NEGATIVE FOR HELICOBACTER PYLORI. - NO INTESTINAL METAPLASIA, DYSPLASIA OR CARCINOMA. 3. Surgical [P], esophagus, GE junction - GASTROESOPHAGEAL MUCOSA WITH INTESTINAL METAPLASIA CONSISTENT WITH BARRETT'S ESOPHAGUS. - NO DYSPLASIA OR CARCINOMA. 4. Surgical [P], colon, cecum, ascending, transverse, rectum, polyp (5) - TUBULAR ADENOMA(S). - NO HIGH GRADE DYSPLASIA OR CARCINOMA. - SESSILE SERRATED POLYP(S) WITHOUT CYTOLOGIC DYSPLASIA. - HYPERPLASTIC POLYP.   Following this exam we instructed him to increase omeprazole to 40 mg twice daily for 6 weeks which he did.  I also instructed him to stop his ibuprofen but continue his baby aspirin.  I recommended alcohol abstinence.  As above biopsies of his stomach showed no evidence of H. pylori, small gastric ulcer thought to be related to NSAID use.  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  Generally he is feeling pretty well and he really denies any complaints at this point in  time.  He has been maintained off all NSAIDs and doing pretty well.  He is taking omeprazole 20 mg once to twice daily at this point time he just started doing that about 2 days ago.  He states since he stopped drinking alcohol and been on this medication his reflux symptoms have essentially resolved.  He has not needed to use any more Tums.  He is also stopped his iron supplementation recently.  Otherwise he has completely stopped drinking alcohol as of a week ago Tuesday.  He has been using Librium and Remeron to help him with this.  Given the Remeron constipates him he stopped taking supplemental iron.  He states he had a CBC done a few weeks ago but I do not have a copy of that available today.  Otherwise he has no complaints and is feeling well.  His mother was diagnosed with colon cancer at age less than 27.  He denies any other known family history of GI tract malignancy.       Past Medical History:  Diagnosis Date  . Alcohol abuse   . Anemia   . Anxiety   . Arthritis   . Cervical spinal stenosis   . Chronic gastritis   . COPD (chronic obstructive pulmonary disease) ([place])   . Depression   . Diverticulosis   . Headache(784.0)    migraine  . HTN (hypertension)   . Hyperlipidemia   . Lumbar spinal stenosis   . Prostate cancer ([place]) 2004   seed implant and beam radiation  . Stroke [facility])    2010 TIA  . TIA (transient ischemic attack) 06/2009     Past Surgical History:  Procedure Laterality Date  . APPENDECTOMY  1952  . COLONOSCOPY     dr Earlean Shawl around 2017  . INSERTION PROSTATE RADIATION SEED    . RHINOPLASTY    . SPINE SURGERY    . TONSILLECTOMY  1950  . TOTAL HIP ARTHROPLASTY Right 04/16/2013   Procedure: RIGHT TOTAL HIP ARTHROPLASTY ANTERIOR APPROACH;  Surgeon: Mcarthur Rossetti, MD;  Location: [facility];  Service: Orthopedics;  Laterality: Right;   Family History  Problem Relation Age of Onset  . Colon cancer Mother   . Lung cancer Mother   . Stroke Father   .  Prostate cancer Father   . Cancer Maternal Grandfather        lung  . Heart attack Brother   . Hypertension Brother        hyperlipidemia  . Esophageal cancer Neg Hx    Social History   Tobacco Use  . Smoking status: Former Smoker    Packs/day: 1.00    Years: 25.00    Pack years: 25.00    Quit date: 04/05/1998    Years since quitting: 22.5  . Smokeless tobacco: Never Used  Vaping Use  . Vaping Use: Never used  Substance Use Topics  . Alcohol use: Yes    Alcohol/week: 7.0 standard drinks    Types: 7 Shots of liquor per week    Comment: 6 drinks every day; stopped as of 10/2020  . Drug use: Yes    Types: Marijuana  Comment: less then 1/2 gm a day   Current Outpatient Medications  Medication Sig Dispense Refill  . Acetaminophen (TYLENOL PO) Take 3 tablets by mouth 2 (two) times daily.    Marland Kitchen albuterol (PROVENTIL HFA;VENTOLIN HFA) 108 (90 Base) MCG/ACT inhaler Inhale 2 puffs into the lungs every 4 (four) hours as needed.     Marland Kitchen ascorbic acid (VITAMIN C) 500 MG tablet Take 500 mg by mouth daily.    . ASPIRIN 81 PO Take 1 tablet by mouth at bedtime.    Marland Kitchen b complex vitamins capsule Take 1 capsule by mouth daily.     . budesonide-formoterol (SYMBICORT) 160-4.5 MCG/ACT inhaler Inhale 2 puffs into the lungs 2 (two) times daily.     . chlordiazePOXIDE (LIBRIUM) 25 MG capsule Take 25 mg by mouth 3 (three) times daily as needed for anxiety.    . cholecalciferol (VITAMIN D) 1000 UNITS tablet Take 4,000 Units by mouth daily.    . Cyanocobalamin (VITAMIN B-12) 5000 MCG TBDP Take 1 tablet by mouth daily.    . fluticasone (FLONASE) 50 MCG/ACT nasal spray Place 2 sprays into the nose daily.    Marland Kitchen gabapentin (NEURONTIN) 600 MG tablet Take 600 mg by mouth 4 (four) times daily.    Marland Kitchen latanoprost (XALATAN) 0.005 % ophthalmic solution Place 1 drop into both eyes at bedtime.    Marland Kitchen losartan (COZAAR) 100 MG tablet Take 100 mg by mouth daily.    . magnesium oxide (MAG-OX) 400 MG tablet Take 400 mg by mouth  daily.    . mirtazapine (REMERON) 30 MG tablet Take 30 mg by mouth at bedtime.    . Multiple Vitamins-Minerals (CENTRUM ADULTS PO) Take by mouth daily.    Marland Kitchen NASAL SALINE NA Place 2 sprays into both nostrils as needed (sinus).     . Omega-3 Fatty Acids (FISH OIL PO) Take 1 tablet by mouth at bedtime.    Marland Kitchen omeprazole (PRILOSEC) 20 MG capsule Take 20 mg by mouth daily.    Marland Kitchen oxymetazoline (AFRIN) 0.05 % nasal spray Place 2 sprays into the nose daily as needed.     Marland Kitchen oxymetazoline (AFRIN) 0.05 % nasal spray Place 1 spray into both nostrils at bedtime.    . Probiotic Product (PROBIOTIC-10 PO) Take by mouth daily.    Marland Kitchen terazosin (HYTRIN) 2 MG capsule Take 2 mg by mouth. 4 tablets at night     Current Facility-Administered Medications  Medication Dose Route Frequency Provider Last Rate Last Admin  . bupivacaine (MARCAINE) 0.5 % (with pres) injection 3 mL  3 mL Other Once [name], MD       Allergies  Allergen Reactions  . Codeine Sulfate [Codeine]   . Penicillins     unknown  . Persantine [Dipyridamole] Other (See Comments)    Patient cannot recall reaction, perhaps "mental fog" "zombie state"  . Tramadol Other (See Comments)    irritability  . Adhesive [Tape] Rash  . Hydrocodone     migraines     Review of Systems: All systems reviewed and negative except where noted in HPI.   Lab Results  Component Value Date   WBC 5.8 07/07/2020   HGB 13.0 07/07/2020   HCT 38.8 (L) 07/07/2020   MCV 94.5 07/07/2020   PLT 255.0 07/07/2020    Lab Results  Component Value Date   CREATININE 1.24 09/09/2019   BUN 12 04/17/2013   NA 135 04/17/2013   K 3.7 04/17/2013   CL 98 04/17/2013   CO2 27 04/17/2013  No results found for: IRON, TIBC, FERRITIN   Physical Exam: BP 130/70   Pulse 74   Ht 5\' 7"  (1.702 m)   Wt 165 lb 9.6 oz (75.1 kg)   SpO2 95%   BMI 25.94 kg/m  Constitutional: Pleasant,well-developed, male in no acute distress. Neurological: Alert and oriented to person  place and time. Skin: Skin is warm and dry. No rashes noted. Psychiatric: Normal mood and affect. Behavior is normal.   ASSESSMENT AND PLAN: 80 year old male here for reassessment of the following issues:  Iron deficiency anemia / Gastric ulcer - patient with a small gastric ulcer and gastritis noted on EGD in the setting of NSAID use which is the likely cause for his iron deficiency.  Biopsies of the ulcer were benign, biopsies of the stomach were negative for H. pylori.  He was on high-dose PPI for 6 weeks and stopped all NSAIDs other than baby aspirin.  Clinically he is feeling quite well at this time and has just resumed lower dose of his omeprazole.  Forward I recommend he avoid NSAIDs other than baby aspirin, he should continue his baby aspirin in light of history of TIA.  I am recommending indefinite use of omeprazole in light of his Barrett's esophagus as below.  We discussed doing a follow-up upper endoscopy to ensure mucosal healing of the gastric ulcer.  Given the ulcer was small, biopsy negative for any concerning changes and he is feeling well, he declines follow-up endoscopy at this time.  I will get results of his recent CBC done at Dr. Deboraha Sprang office to ensure his anemia and iron deficiency has resolved.  If he has persistent iron deficiency we may need to consider upper endoscopy again.  He agreed  Barrett's esophagus - very short segment of Barrett's esophagus noted incidentally on EGD.  Discussed what this is with them and increased risk for esophageal cancer although the overall risk is extremely low.  Recommend indefinite use of omeprazole to reduce risk of esophageal cancer moving forward.  He is agreeable to this, it otherwise controls his reflux symptoms extremely well.  Recommend repeat EGD in 3 years pending on his health and how he is feeling for surveillance purposes, he can see me that that time to discuss.  Alcohol use - longstanding alcohol use without obvious cirrhosis.   He is done a really good job with cessation in recent weeks and working with his physicians on this.  I applauded his efforts and hopefully this will help his overall health moving forward  History of colon polyps - consider surveillance colonoscopy in 3 years depending on how he is feeling in his general health, as he will be 80 years old at that time. we will see him at that time in clinic to discuss   Cellar, MD [facility]  CC: [name], MD

## 2020-10-23 NOTE — Patient Instructions (Addendum)
If you are age 78 or older, your body mass index should be between 23-30. Your Body mass index is 25.94 kg/m. If this is out of the aforementioned range listed, please consider follow up with your Primary Care Provider.  If you are age 28 or younger, your body mass index should be between 19-25. Your Body mass index is 25.94 kg/m. If this is out of the aformentioned range listed, please consider follow up with your Primary Care Provider.   Continue to avoid alcohol.  Continue to avoid NSAIDs other than 81 mg aspirin.  Continue omeprazole 20 mg once daily.  We will request your recent lab work from Dr. Deboraha Sprang office.  You will be due for a recall colonoscopy in 07-2023. We will send you a reminder in the mail when it gets closer to that time.  Thank you for entrusting me with your care and for choosing [facility], Dr. Mendon Cellar

## 2020-10-30 ENCOUNTER — Other Ambulatory Visit: Payer: Self-pay

## 2020-10-30 ENCOUNTER — Other Ambulatory Visit: Payer: Self-pay | Admitting: Family Medicine

## 2020-10-30 ENCOUNTER — Ambulatory Visit
Admission: RE | Admit: 2020-10-30 | Discharge: 2020-10-30 | Disposition: A | Discharge status: Home / Self Care | Payer: Medicare Other | Source: Ambulatory Visit | Attending: Family Medicine | Admitting: Family Medicine

## 2020-10-30 DIAGNOSIS — W19XXXA Unspecified fall, initial encounter: Secondary | ICD-10-CM

## 2020-11-03 ENCOUNTER — Telehealth: Payer: Self-pay | Admitting: Gastroenterology

## 2020-11-03 NOTE — Telephone Encounter (Signed)
Labs arrived from Dr. Deboraha Sprang office -  09/30/20 - Hgb 13.9, MCV 99  Resolution of anemia confirmed, will continue plan as per office visit clinic note.

## 2020-11-10 ENCOUNTER — Telehealth: Payer: Self-pay | Admitting: Physical Medicine and Rehabilitation

## 2020-11-10 NOTE — Telephone Encounter (Signed)
Rodena Piety the pts wife called to get an appt scheduled for the pt.   [phone]

## 2020-11-11 ENCOUNTER — Telehealth: Payer: Self-pay | Admitting: Physical Medicine and Rehabilitation

## 2020-11-11 NOTE — Telephone Encounter (Signed)
Scheduled for OV on 2/22 at 1300. Patient requested afternoon.

## 2020-11-11 NOTE — Telephone Encounter (Signed)
Left message #1. Patient had bilateral L5-S1 MBB on 07/09/2019.

## 2020-11-11 NOTE — Telephone Encounter (Signed)
Patient's wife Rodena Piety returned call to schedule an appointment for patient. The number to contact Rodena Piety is [phone]

## 2020-11-11 NOTE — Telephone Encounter (Signed)
See previous message

## 2020-12-08 ENCOUNTER — Encounter: Payer: Self-pay | Admitting: Physical Medicine and Rehabilitation

## 2020-12-08 ENCOUNTER — Other Ambulatory Visit: Payer: Self-pay

## 2020-12-08 ENCOUNTER — Ambulatory Visit (INDEPENDENT_AMBULATORY_CARE_PROVIDER_SITE_OTHER): Payer: Medicare Other | Admitting: Physical Medicine and Rehabilitation

## 2020-12-08 VITALS — BP 131/68 | HR 92

## 2020-12-08 DIAGNOSIS — M7918 Myalgia, other site: Secondary | ICD-10-CM | POA: Diagnosis not present

## 2020-12-08 DIAGNOSIS — M4120 Other idiopathic scoliosis, site unspecified: Secondary | ICD-10-CM

## 2020-12-08 DIAGNOSIS — M5136 Other intervertebral disc degeneration, lumbar region: Secondary | ICD-10-CM

## 2020-12-08 DIAGNOSIS — M4325 Fusion of spine, thoracolumbar region: Secondary | ICD-10-CM | POA: Diagnosis not present

## 2020-12-08 DIAGNOSIS — M545 Low back pain, unspecified: Secondary | ICD-10-CM

## 2020-12-08 DIAGNOSIS — R269 Unspecified abnormalities of gait and mobility: Secondary | ICD-10-CM

## 2020-12-08 DIAGNOSIS — G8929 Other chronic pain: Secondary | ICD-10-CM

## 2020-12-08 DIAGNOSIS — M51369 Other intervertebral disc degeneration, lumbar region without mention of lumbar back pain or lower extremity pain: Secondary | ICD-10-CM

## 2020-12-08 MED ORDER — DIAZEPAM 5 MG PO TABS
ORAL_TABLET | ORAL | 0 refills | Status: DC
Start: 2020-12-08 — End: 2021-01-13

## 2020-12-08 NOTE — Progress Notes (Signed)
Pt state lower back and right hip pain. Pt state walking makes the pain worse Pt state he takes over the counter pain meds to help ease the pain.  Numeric Pain Rating Scale and Functional Assessment Average Pain 5 Pain Right Now 5 My pain is intermittent and aching Pain is worse with: walking Pain improves with: medication   In the last MONTH (on 0-10 scale) has pain interfered with the following?  1. General activity like being  able to carry out your everyday physical activities such as walking, climbing stairs, carrying groceries, or moving a chair?  Rating(5)  2. Relation with others like being able to carry out your usual social activities and roles such as  activities at home, at work and in your community. Rating(5)  3. Enjoyment of life such that you have  been bothered by emotional problems such as feeling anxious, depressed or irritable?  Rating(5)

## 2020-12-09 ENCOUNTER — Other Ambulatory Visit: Payer: Self-pay | Admitting: Physical Medicine and Rehabilitation

## 2020-12-20 ENCOUNTER — Other Ambulatory Visit: Payer: Self-pay

## 2020-12-20 ENCOUNTER — Ambulatory Visit
Admission: RE | Admit: 2020-12-20 | Discharge: 2020-12-20 | Disposition: A | Discharge status: Home / Self Care | Payer: Medicare Other | Source: Ambulatory Visit | Attending: Physical Medicine and Rehabilitation | Admitting: Physical Medicine and Rehabilitation

## 2020-12-31 ENCOUNTER — Ambulatory Visit: Payer: Medicare Other | Admitting: Specialist

## 2021-01-01 ENCOUNTER — Ambulatory Visit: Payer: Medicare Other | Admitting: Rehabilitative and Restorative Service Providers"

## 2021-01-12 ENCOUNTER — Other Ambulatory Visit: Payer: Self-pay

## 2021-01-12 ENCOUNTER — Encounter: Payer: Self-pay | Admitting: Physical Medicine and Rehabilitation

## 2021-01-12 ENCOUNTER — Ambulatory Visit (INDEPENDENT_AMBULATORY_CARE_PROVIDER_SITE_OTHER): Payer: Medicare Other | Admitting: Physical Medicine and Rehabilitation

## 2021-01-12 VITALS — BP 117/67 | HR 86

## 2021-01-12 DIAGNOSIS — M4325 Fusion of spine, thoracolumbar region: Secondary | ICD-10-CM

## 2021-01-12 DIAGNOSIS — M47816 Spondylosis without myelopathy or radiculopathy, lumbar region: Secondary | ICD-10-CM

## 2021-01-12 DIAGNOSIS — R269 Unspecified abnormalities of gait and mobility: Secondary | ICD-10-CM | POA: Diagnosis not present

## 2021-01-12 DIAGNOSIS — M961 Postlaminectomy syndrome, not elsewhere classified: Secondary | ICD-10-CM

## 2021-01-12 DIAGNOSIS — M4120 Other idiopathic scoliosis, site unspecified: Secondary | ICD-10-CM

## 2021-01-12 NOTE — Progress Notes (Signed)
Here for MRI review. Most of the pain is "above the low back" per patient. Worse with walking. Better with sitting. Numeric Pain Rating Scale and Functional Assessment Average Pain 5   In the last MONTH (on 0-10 scale) has pain interfered with the following?  1. General activity like being  able to carry out your everyday physical activities such as walking, climbing stairs, carrying groceries, or moving a chair?  Rating(3)

## 2021-01-12 NOTE — Progress Notes (Signed)
Jesse Robbins - 80 y.o. male MRN [id number]  Date of birth: July 05, 1941  Office Visit Note: Visit Date: 01/12/2021 PCP: [name], MD Referred by: [name], MD  Subjective: Chief Complaint  Patient presents with  . Lower Back - Pain   HPI: Jesse Robbins is a 80 y.o. male who comes in today For evaluation and management of chronic neck and back pain status post fusion for scoliosis by Dr. [name] Plan at [facility] and now with continued difficulty with ambulation and daily living do to gait abnormality and pain.  Patient's history complicated by depression and anxiety and alcohol abuse.  Patient continues to take gabapentin as well as medication for suppression anxiety which may benefit from a nerve membrane stabilizing effect.  Last time I saw the patient we decided to look at MRI of the lower spine.  My impression at that time is he was having some lower back pain some referral to the right hip.  Talking with him today it seems like it is more of an overall problem of daily living with gait abnormality difficulty walking and not so much a specific pain standpoint.  In terms of pain he states this is a little bit more above his lower back.  He gets relief with sitting and just has difficulty walking.  He endorses again throwing his hip to the right more when he walks.  He has had a hip replacement by Dr. Ninfa Linden.  He had follow-up with Dr. Ninfa Linden who felt like the hip was doing okay.  Nonetheless we do have MRI to review today and this is reviewed with him with spine models and imaging and the report is below.  His wife is on the phone with [place] once again and we do discuss this with her.  We spoke at length today reviewing all of this and his complaints and plan for over 45 minutes.  He does not complain of any focal weakness bowel bladder changes or any other red flag complaints.  MRI was at least reassuring with there is no high-grade nerve compression or stenosis.  Again the MRI does not show  above his long stem fusion.  He does have possible not full fusion of L4-5 although there is not much in the way of problems there clinically.  He does have facet arthropathy at L5-S1 below the fusion which is to be expected and can be a source of his back pain.  He really wants to be able to exercise and has not been in physical therapy recently.  We had referred him to physical therapy prior to this appointment and he does have an appointment this week.  Review of Systems  Musculoskeletal: Positive for back pain and joint pain.  Neurological: Positive for weakness.  All other systems reviewed and are negative.  Otherwise per HPI.  Assessment & Plan: Visit Diagnoses:    ICD-10-CM   1. Idiopathic scoliosis and kyphoscoliosis  M41.20   2. Fusion of spine, thoracolumbar region  M43.25   3. Abnormal gait  R26.9   4. Spondylosis without myelopathy or radiculopathy, lumbar region  M47.816   5. Post laminectomy syndrome  M96.1      Plan: Findings:  Chronic worsening severe and recalcitrant spine problems including intermittent pain higher in the lower thoracic region and in the lower back at times with difficulty ambulating with problem with his gait throwing his hip to the side when he walks and forward flexion.  MRI is now completed and looking  at his lower spine does not show any new findings other than facet arthropathy below the fusion.  Consideration might be given to MRI of the thoracic spine to look at adjacent level disease above this region but is not really having that be a complaint of that.  Pain level is okay currently but he does get difficulty with movement and he wants to be able to exercise more.  Again spoke at great length about all of his problems today.  Unfortunately not a very good solution for him with these prior longstem fusions and hip replacement in terms of gait management that simple.  I think his biggest benefit will be from focused physical therapy continued home exercise  and trying to work at this problem with mobility.  There is likely to be a magic injection or medication this is going to solve this issue.  His history is complicated in the past as well with a significant amount of depression anxiety as well as alcohol abuse.  He will continue to follow with Dr. Sandi Mariscal his primary physician.  We will set him up for physical therapy.  If his back pain were to increase particular the lower back we would look at facet joint blocks and potential for radiofrequency ablation.  If he gets more pain of the thoracic spine I would recommend MRI of the thoracic spine.    Meds & Orders: No orders of the defined types were placed in this encounter.  No orders of the defined types were placed in this encounter.   Follow-up: Return if symptoms worsen or fail to improve.   Procedures: No procedures performed      Clinical History: MRI LUMBAR SPINE WITHOUT CONTRAST  TECHNIQUE: Multiplanar, multisequence MR imaging of the lumbar spine was performed. No intravenous contrast was administered.  COMPARISON:  Radiography 10/30/2020.  MRI 07/19/2015.  FINDINGS: Segmentation: 5 lumbar type vertebral bodies as numbered previously.  Alignment: Chronic anterolisthesis at L4-5 measuring 5 mm. 4-5 mm of degenerative anterolisthesis at L5-S1, increased 1 mm since 2016.  Vertebrae: No fracture or primary bone lesion. Thoracolumbar fusion extending from the thoracic region down to the level of L5.  Conus medullaris and cauda equina: Conus extends to the L1 level. Conus and cauda equina appear normal.  Paraspinal and other soft tissues: No significant paravertebral finding. Chronic fluid collection posterior to the spinal canal at the L3-4 level, unchanged in appearance since 2016.  Disc levels:  Probable solid union with sufficient patency of the canal and foramina from T10-L4. Pedicle screws and posterior rods. Sufficient patency of the canal and  foramina.  L4-5: Question potential for nonunion at this level. There did appear to be some lucency around the L5 screws at radiography. Sufficient patency of the central canal. Bilateral foraminal narrowing with some potential to affect either or both L4 nerves.  L5-S1: Bilateral facet arthropathy with 4-5 mm of anterolisthesis. Shallow protrusion of the disc. Stenosis of the subarticular lateral recesses left more than right. Neural compression could occur, particularly on the left. Bilateral foraminal stenosis left worse than right could affect either L5 nerve, more likely the left. These findings have worsened since 2016.  IMPRESSION: 1. Thoracolumbar fusion extending from the thoracic region down to the level of L5. There is a possibility of nonunion at the L4-5 level. This could be better determined by CT. 2. Chronic anterolisthesis at L4-5 measuring 5 mm. 4-5 mm of degenerative anterolisthesis at L5-S1, increased slightly since 2016. 3. Bilateral foraminal stenosis at L4-5 that  could affect either or both L4 nerves. 4. Stenosis of the subarticular lateral recesses and neural foramina at L5-S1 that could cause neural compression, particularly on the left. Findings have worsened since 2016.   Electronically Signed   By: Nelson Chimes M.D.   On: 12/21/2020 08:44   He reports that he quit smoking about 22 years ago. He has a 25.00 pack-year smoking history. He has never used smokeless tobacco. No results for input(s): HGBA1C, LABURIC in the last 8760 hours.  Objective:  VS:  HT:    WT:   BMI:     BP:117/67  HR:86bpm  TEMP: ( )  RESP:  Physical Exam Vitals and nursing note reviewed.  Constitutional:      General: He is not in acute distress.    Appearance: Normal appearance. He is not ill-appearing.  HENT:     Head: Normocephalic and atraumatic.     Right Ear: External ear normal.     Left Ear: External ear normal.     Nose: No congestion.  Eyes:      Extraocular Movements: Extraocular movements intact.  Cardiovascular:     Rate and Rhythm: Normal rate.     Pulses: Normal pulses.  Pulmonary:     Effort: Pulmonary effort is normal. No respiratory distress.  Abdominal:     General: There is no distension.     Palpations: Abdomen is soft.  Musculoskeletal:        General: No tenderness or signs of injury.     Cervical back: Neck supple.     Right lower leg: No edema.     Left lower leg: No edema.     Comments: Patient has good distal strength without clonus.  He ambulates without aid with a gait that is marked with forward flexion of the lumbar spine give leftward shift with his hip somewhat circumducting.  He is slow to rise from a seated position.  He does have pain with extension.  He is very stiff throughout the lumbar spine as to be expected.  He does have some myofascial trigger points.  He does not have pain with hip movement.  Skin:    Findings: No erythema or rash.  Neurological:     General: No focal deficit present.     Mental Status: He is alert and oriented to person, place, and time.     Cranial Nerves: No cranial nerve deficit.     Sensory: No sensory deficit.     Motor: No weakness or abnormal muscle tone.     Coordination: Coordination normal.     Gait: Gait abnormal.  Psychiatric:        Mood and Affect: Mood normal.        Behavior: Behavior normal.     Ortho Exam  Imaging: No results found.  Past Medical/Family/Surgical/Social History: Medications & Allergies reviewed per EMR, new medications updated. Patient Active Problem List   Diagnosis Date Noted  . Fusion of spine, thoracolumbar region 01/13/2021  . Abnormal gait 01/13/2021  . PSA elevation 11/01/2018  . Continuous chronic alcoholism ([place]) 12/05/2013  . CAFL (chronic airflow limitation) ([place]) 12/05/2013  . Acid reflux 12/05/2013  . H/O malignant neoplasm of prostate 12/05/2013  . History of repair of hip joint 12/05/2013  . H/O transient  cerebral ischemia 12/05/2013  . Chronic obstructive pulmonary disease ([place]) 12/05/2013  . Idiopathic scoliosis and kyphoscoliosis 08/21/2013  . Lumbar canal stenosis 08/21/2013  . 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[place]) 04/16/2013  .  Pre-op evaluation 04/06/2013  . Essential hypertension 04/06/2013  . Dyslipidemia 04/06/2013   Past Medical History:  Diagnosis Date  . Alcohol abuse   . Anemia   . Anxiety   . Arthritis   . Cervical spinal stenosis   . Chronic gastritis   . COPD (chronic obstructive pulmonary disease) ([place])   . Depression   . Diverticulosis   . Headache(784.0)    migraine  . HTN (hypertension)   . Hyperlipidemia   . Lumbar spinal stenosis   . 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 Stroke [facility])    2010 TIA  . TIA (transient ischemic attack) 06/2009   Family History  Problem Relation Age of Onset  . Colon cancer Mother   . Lung cancer Mother   . Stroke Father   . Prostate cancer Father   . Cancer Maternal Grandfather        lung  . Heart attack Brother   . Hypertension Brother        hyperlipidemia  . Esophageal cancer Neg Hx    Past Surgical History:  Procedure Laterality Date  . APPENDECTOMY  1952  . COLONOSCOPY     dr Earlean Shawl around 2017  . INSERTION PROSTATE RADIATION SEED    . RHINOPLASTY    . SPINE SURGERY    . TONSILLECTOMY  1950  . TOTAL HIP ARTHROPLASTY Right 04/16/2013   Procedure: RIGHT TOTAL HIP ARTHROPLASTY ANTERIOR APPROACH;  Surgeon: Mcarthur Rossetti, MD;  Location: [facility];  Service: Orthopedics;  Laterality: Right;   Social History   Occupational History  . Not on file  Tobacco Use  . Smoking status: Former Smoker    Packs/day: 1.00    Years: 25.00    Pack years: 25.00    Quit date: 04/05/1998    Years since quitting: 22.7  . Smokeless tobacco: Never Used  Vaping Use  . Vaping Use: Never used  Substance and Sexual Activity  . Alcohol use: Yes    Alcohol/week: 7.0 standard drinks     Types: 7 Shots of liquor per week    Comment: 6 drinks every day; stopped as of 10/2020  . Drug use: Yes    Types: Marijuana    Comment: less then 1/2 gm a day  . Sexual activity: Not on file

## 2021-01-13 ENCOUNTER — Encounter: Payer: Self-pay | Admitting: Physical Medicine and Rehabilitation

## 2021-01-13 DIAGNOSIS — R269 Unspecified abnormalities of gait and mobility: Secondary | ICD-10-CM | POA: Insufficient documentation

## 2021-01-13 DIAGNOSIS — M4325 Fusion of spine, thoracolumbar region: Secondary | ICD-10-CM | POA: Insufficient documentation

## 2021-01-13 NOTE — Progress Notes (Signed)
Jesse Robbins - 80 y.o. male MRN [id number]  Date of birth: 05-16-1941  Office Visit Note: Visit Date: 12/08/2020 PCP: [name], MD Referred by: [name], MD  Subjective: Chief Complaint  Patient presents with  . Lower Back - Pain  . Right Hip - Pain   HPI: Jesse Robbins is a 80 y.o. male who comes in today At the request of [name], MD for evaluation management of chronic recalcitrant back pain both thoracic and lower back as well as hip problems and gait abnormality.  I have seen the patient on few occasions in the past.  He is followed by Dr. Jean Rosenthal in our office in terms of his hip.  Patient has had a history of hip replacement do to avascular necrosis and history of alcohol abuse.  We have seen the patient in the past and he usually just wants to talk about his condition and is problem with mobility and this has been going on for some time.  He does have his wife on the phone who we are talking with currently.  He has had a long stem fusion for idiopathic scoliosis.  This was completed by Dr. [name] Plan at [facility].  He reports since that time he just had ongoing difficulties with movement and pain in general.  His pain is episodic.  He ambulates without aid but he does have difficulty.  He complains of throwing his right hip to the side.  He does have hip and back pain.  His pain is really more centered over the lower back and referral into the right hip at times with standing and moving.  He is better at rest.  He has drug intolerances to a lot of pain medications in general.  He continues to take gabapentin.  He is on a lot of medications for depression and anxiety.  These are all reviewed today.  Complete set of notes from Dr. Sandi Mariscal reviewed which numbered over 40 pages.  We also reviewed notes in the chart from Dr. [name] Plan and Dr. Ninfa Linden.  He denies any radicular symptoms down the legs.  No focal weakness.  He has had no red flag symptoms or  trauma.  He would like to get back to exercising at some point really misses the ability to walk for distance.  Review of Systems  Musculoskeletal: Positive for back pain and joint pain.  Neurological: Positive for weakness.  All other systems reviewed and are negative.  Otherwise per HPI.  Assessment & Plan: Visit Diagnoses:    ICD-10-CM   1. Idiopathic scoliosis and kyphoscoliosis  M41.20 Ambulatory referral to Physical Therapy    Ambulatory referral to Orthopedic Surgery  2. Fusion of spine, thoracolumbar region  M43.25 Ambulatory referral to Physical Therapy    Ambulatory referral to Orthopedic Surgery  3. Chronic right-sided low back pain without sciatica  M54.50 Ambulatory referral to Physical Therapy   G89.29 Ambulatory referral to Orthopedic Surgery  4. Myofascial pain syndrome  M79.18 Ambulatory referral to Physical Therapy  5. Abnormal gait  R26.9   6. Other intervertebral disc degeneration, lumbar region  M51.36 MR LUMBAR SPINE WO CONTRAST     Plan: Findings:  Chronic worsening recalcitrant really labeled as spine problem status post long fusion and prior hip placement.  Case is complicated by chronic alcohol abuse and avascular necrosis which warranted the hip replacement.  Patient has had prior surgery again for scoliosis.  Facet joint blocks in the past and the lower level below  the fusion were beneficial short-term.  We see him on a few occasion he does does not seem to like the idea necessarily of injections as they have really not helped that much.  At this point I think we should look at MRI of the lumbar spine to see if there is anything new that could be problematic with his gait changes and change in his posture.  He could have some issue below the fusion such as disc herniation etc.  We will get MRI of the lumbar spine.  This does not really need to be done with contrast since he has had MRIs post surgery in the past there is been no history of scar tissue.  Were also  going to set him up for physical therapy for gait training and evaluation as well as scoliosis type physical therapy.  I think his best course is going to be extensive physical therapy at the beginning and home exercises and this is just something he is going to have to work at unfortunately there is a big issue with a long stem fusion and just continued aging and degenerative change.    Meds & Orders:  Meds ordered this encounter  Medications  . DISCONTD: diazepam (VALIUM) 5 MG tablet    Sig: Take 1 by mouth 1 hour  pre-procedure with very light food. May bring 2nd tablet to appointment.    Dispense:  2 tablet    Refill:  0    Orders Placed This Encounter  Procedures  . MR LUMBAR SPINE WO CONTRAST  . Ambulatory referral to Physical Therapy  . Ambulatory referral to Orthopedic Surgery    Follow-up: No follow-ups on file.   Procedures: No procedures performed      Clinical History: MRI LUMBAR SPINE WITHOUT CONTRAST  TECHNIQUE: Multiplanar, multisequence MR imaging of the lumbar spine was performed. No intravenous contrast was administered.  COMPARISON:  Radiography 10/30/2020.  MRI 07/19/2015.  FINDINGS: Segmentation: 5 lumbar type vertebral bodies as numbered previously.  Alignment: Chronic anterolisthesis at L4-5 measuring 5 mm. 4-5 mm of degenerative anterolisthesis at L5-S1, increased 1 mm since 2016.  Vertebrae: No fracture or primary bone lesion. Thoracolumbar fusion extending from the thoracic region down to the level of L5.  Conus medullaris and cauda equina: Conus extends to the L1 level. Conus and cauda equina appear normal.  Paraspinal and other soft tissues: No significant paravertebral finding. Chronic fluid collection posterior to the spinal canal at the L3-4 level, unchanged in appearance since 2016.  Disc levels:  Probable solid union with sufficient patency of the canal and foramina from T10-L4. Pedicle screws and posterior rods.  Sufficient patency of the canal and foramina.  L4-5: Question potential for nonunion at this level. There did appear to be some lucency around the L5 screws at radiography. Sufficient patency of the central canal. Bilateral foraminal narrowing with some potential to affect either or both L4 nerves.  L5-S1: Bilateral facet arthropathy with 4-5 mm of anterolisthesis. Shallow protrusion of the disc. Stenosis of the subarticular lateral recesses left more than right. Neural compression could occur, particularly on the left. Bilateral foraminal stenosis left worse than right could affect either L5 nerve, more likely the left. These findings have worsened since 2016.  IMPRESSION: 1. Thoracolumbar fusion extending from the thoracic region down to the level of L5. There is a possibility of nonunion at the L4-5 level. This could be better determined by CT. 2. Chronic anterolisthesis at L4-5 measuring 5 mm. 4-5 mm of degenerative  anterolisthesis at L5-S1, increased slightly since 2016. 3. Bilateral foraminal stenosis at L4-5 that could affect either or both L4 nerves. 4. Stenosis of the subarticular lateral recesses and neural foramina at L5-S1 that could cause neural compression, particularly on the left. Findings have worsened since 2016.   Electronically Signed   By: Nelson Chimes M.D.   On: 12/21/2020 08:44   He reports that he quit smoking about 22 years ago. He has a 25.00 pack-year smoking history. He has never used smokeless tobacco. No results for input(s): HGBA1C, LABURIC in the last 8760 hours.  Objective:  VS:  HT:    WT:   BMI:     BP:131/68  HR:92bpm  TEMP: ( )  RESP:  Physical Exam Vitals and nursing note reviewed.  Constitutional:      General: He is not in acute distress.    Appearance: Normal appearance. He is not ill-appearing.  HENT:     Head: Normocephalic and atraumatic.     Right Ear: External ear normal.     Left Ear: External ear normal.     Nose:  No congestion.  Eyes:     Extraocular Movements: Extraocular movements intact.  Cardiovascular:     Rate and Rhythm: Normal rate.     Pulses: Normal pulses.  Pulmonary:     Effort: Pulmonary effort is normal. No respiratory distress.  Abdominal:     General: There is no distension.     Palpations: Abdomen is soft.  Musculoskeletal:        General: No tenderness or signs of injury.     Cervical back: Neck supple.     Right lower leg: No edema.     Left lower leg: No edema.     Comments: Patient stands with great difficulty he cannot really achieve full extension.  He does have pain with extension in the lower spine.  He has good range of motion of both hips.  He does stand with a leftward lean and forward flexion and he ambulates pretty much in that position.  He has no hip hiking or Trendelenburg etc.  Patient has good distal strength without clonus.  Skin:    Findings: No erythema or rash.  Neurological:     General: No focal deficit present.     Mental Status: He is alert and oriented to person, place, and time.     Cranial Nerves: No cranial nerve deficit.     Sensory: No sensory deficit.     Motor: No weakness or abnormal muscle tone.     Coordination: Coordination normal.     Gait: Gait abnormal.  Psychiatric:        Mood and Affect: Mood normal.        Behavior: Behavior normal.     Ortho Exam  Imaging: No results found.  Past Medical/Family/Surgical/Social History: Medications & Allergies reviewed per EMR, new medications updated. Patient Active Problem List   Diagnosis Date Noted  . Fusion of spine, thoracolumbar region 01/13/2021  . Abnormal gait 01/13/2021  . PSA elevation 11/01/2018  . Continuous chronic alcoholism ([place]) 12/05/2013  . CAFL (chronic airflow limitation) ([place]) 12/05/2013  . Acid reflux 12/05/2013  . H/O malignant neoplasm of prostate 12/05/2013  . History of repair of hip joint 12/05/2013  . H/O transient cerebral ischemia 12/05/2013  .  Chronic obstructive pulmonary disease ([place]) 12/05/2013  . Idiopathic scoliosis and kyphoscoliosis 08/21/2013  . Lumbar canal stenosis 08/21/2013  . 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  [place]) 04/16/2013  . Pre-op evaluation 04/06/2013  . Essential hypertension 04/06/2013  . Dyslipidemia 04/06/2013   Past Medical History:  Diagnosis Date  . Alcohol abuse   . Anemia   . Anxiety   . Arthritis   . Cervical spinal stenosis   . Chronic gastritis   . COPD (chronic obstructive pulmonary disease) ([place])   . Depression   . Diverticulosis   . Headache(784.0)    migraine  . HTN (hypertension)   . Hyperlipidemia   . Lumbar spinal stenosis   . 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 Stroke [facility])    2010 TIA  . TIA (transient ischemic attack) 06/2009   Family History  Problem Relation Age of Onset  . Colon cancer Mother   . Lung cancer Mother   . Stroke Father   . Prostate cancer Father   . Cancer Maternal Grandfather        lung  . Heart attack Brother   . Hypertension Brother        hyperlipidemia  . Esophageal cancer Neg Hx    Past Surgical History:  Procedure Laterality Date  . APPENDECTOMY  1952  . COLONOSCOPY     dr Earlean Shawl around 2017  . INSERTION PROSTATE RADIATION SEED    . RHINOPLASTY    . SPINE SURGERY    . TONSILLECTOMY  1950  . TOTAL HIP ARTHROPLASTY Right 04/16/2013   Procedure: RIGHT TOTAL HIP ARTHROPLASTY ANTERIOR APPROACH;  Surgeon: Mcarthur Rossetti, MD;  Location: [facility];  Service: Orthopedics;  Laterality: Right;   Social History   Occupational History  . Not on file  Tobacco Use  . Smoking status: Former Smoker    Packs/day: 1.00    Years: 25.00    Pack years: 25.00    Quit date: 04/05/1998    Years since quitting: 22.7  . Smokeless tobacco: Never Used  Vaping Use  . Vaping Use: Never used  Substance and Sexual Activity  . Alcohol use: Yes    Alcohol/week: 7.0 standard drinks    Types: 7 Shots of liquor per week     Comment: 6 drinks every day; stopped as of 10/2020  . Drug use: Yes    Types: Marijuana    Comment: less then 1/2 gm a day  . Sexual activity: Not on file

## 2021-01-15 ENCOUNTER — Other Ambulatory Visit: Payer: Self-pay

## 2021-01-15 ENCOUNTER — Encounter: Payer: Self-pay | Admitting: Physical Therapy

## 2021-01-15 ENCOUNTER — Ambulatory Visit (INDEPENDENT_AMBULATORY_CARE_PROVIDER_SITE_OTHER): Payer: Medicare Other | Admitting: Physical Therapy

## 2021-01-15 DIAGNOSIS — G8929 Other chronic pain: Secondary | ICD-10-CM

## 2021-01-15 DIAGNOSIS — M6281 Muscle weakness (generalized): Secondary | ICD-10-CM

## 2021-01-15 DIAGNOSIS — M545 Low back pain, unspecified: Secondary | ICD-10-CM | POA: Diagnosis not present

## 2021-01-15 DIAGNOSIS — R293 Abnormal posture: Secondary | ICD-10-CM | POA: Diagnosis not present

## 2021-01-15 DIAGNOSIS — R2689 Other abnormalities of gait and mobility: Secondary | ICD-10-CM | POA: Diagnosis not present

## 2021-01-15 NOTE — Therapy (Signed)
[facility] [address] [place], [place], [address] Phone: [phone]   Fax:  [phone]  Physical Therapy Evaluation  Patient Details  Name: Jesse Robbins MRN: [id number] Date of Birth: 1941/04/11 Referring Provider (PT): [name], MD   Encounter Date: 01/15/2021   PT End of Session - 01/15/21 1405    Visit Number 1    Number of Visits 16    Date for PT Re-Evaluation 03/12/21    PT Start Time 1300    PT Stop Time 1350    PT Time Calculation (min) 50 min    Activity Tolerance Patient tolerated treatment well    Behavior During Therapy [facility] for tasks assessed/performed           Past Medical History:  Diagnosis Date  . Alcohol abuse   . Anemia   . Anxiety   . Arthritis   . Cervical spinal stenosis   . Chronic gastritis   . COPD (chronic obstructive pulmonary disease) ([place])   . Depression   . Diverticulosis   . Headache(784.0)    migraine  . HTN (hypertension)   . Hyperlipidemia   . Lumbar spinal stenosis   . Prostate cancer ([place]) 2004   seed implant and beam radiation  . Stroke [facility])    2010 TIA  . TIA (transient ischemic attack) 06/2009    Past Surgical History:  Procedure Laterality Date  . APPENDECTOMY  1952  . COLONOSCOPY     dr Earlean Shawl around 2017  . INSERTION PROSTATE RADIATION SEED    . RHINOPLASTY    . SPINE SURGERY    . TONSILLECTOMY  1950  . TOTAL HIP ARTHROPLASTY Right 04/16/2013   Procedure: RIGHT TOTAL HIP ARTHROPLASTY ANTERIOR APPROACH;  Surgeon: Mcarthur Rossetti, MD;  Location: [place];  Service: Orthopedics;  Laterality: Right;    There were no vitals filed for this visit.    Subjective Assessment - 01/15/21 1308    Subjective overall problem of daily living with gait abnormality difficulty walking due to neck pain,back pain and weakness. He does get relief with sitting. He does not complain of any focal weakness bowel bladder changes or any other red flag complaints. He does have facet arthropathy at  L5-S1 below the fusion which is to be expected and can be a source of his back pain.  He really wants to be able to exercise and has not been in physical therapy recently. He also has had Rt THA in 2014. He also complains that he cant stand up straight    Pertinent History PMH: long stem fusion thoracic down to L5  2015$ due to scoliosis, severe anx and dep, ETOH abuse, COPD,TIA,Rt hip THA 2014    Limitations Lifting;Standing;Walking;House hold activities    How long can you stand comfortably? up to 30 minutes    How long can you walk comfortably? limited community distances    Diagnostic tests see MRI report in chart    Patient Stated Goals improve standing and walking and posture    Currently in Pain? Yes    Pain Score 3     Pain Location Back   lower, mid, uppper   Pain Descriptors / Indicators Aching;Dull    Pain Type Chronic pain    Pain Radiating Towards denies regular N/T in his legs    Pain Onset More than a month ago    Pain Frequency Intermittent    Aggravating Factors  standing activity    Pain Relieving Factors rest  Multiple Pain Sites No              [facility] PT Assessment - 01/15/21 0001      Assessment   Medical Diagnosis chronic Rt sided LBP S/P long fusion 2015?, scoliosis, myofascial pain syndrome, posture and gait abnormality.    Referring Provider (PT) [name], MD    Onset Date/Surgical Date --   chronic pain, had long scoliosis fusion 2015   Next MD Visit 02/17/21    Prior Therapy nothing recent      Precautions   Precautions None      Restrictions   Weight Bearing Restrictions No      Balance Screen   Has the patient fallen in the past 6 months No    Has the patient had a decrease in activity level because of a fear of falling?  No    Is the patient reluctant to leave their home because of a fear of falling?  No      Home Ecologist residence    Additional Comments has stairs, can do this independently      Prior  Function   Level of Independence Independent    Vocation Retired    Leisure listen to music and podcast, Engineer, maintenance (IT)   Overall Cognitive Status Within Functional Limits for tasks assessed      Posture/Postural Control   Posture Comments Rt hip higher(and Rt leg longer by 1 inch), left trunk shift and left trunk lean, scoliosis, thoracic kyphosis      ROM / Strength   AROM / PROM / Strength AROM;Strength      AROM   Overall AROM Comments shoulder flexion and abd WFL bilat    AROM Assessment Site Cervical;Lumbar    Cervical Flexion 75%    Cervical Extension 50%    Cervical - Right Rotation 25%    Cervical - Left Rotation 50%    Lumbar Flexion 75%    Lumbar Extension 505    Lumbar - Right Side Bend 50%    Lumbar - Left Side Bend 50%    Lumbar - Right Rotation 50%    Lumbar - Left Rotation 50%      Strength   Overall Strength Comments 4+ grossly UE/LE strength      Special Tests   Other special tests negative slump test, negative quadrant test, negative SLR      Transfers   Transfers Independent with all Transfers      Ambulation/Gait   Gait Comments can ambulate independently for limited community distances but decreased velocity, flexed posture, and trunk lean to left. (some improvment observed with heel lift added to Lt shoe)                      Objective measurements completed on examination: See above findings.       [facility] Adult PT Treatment/Exercise - 01/15/21 0001      Exercises   Exercises Lumbar      Lumbar Exercises: Aerobic   Nustep L7 X 8 min UE/LE                  PT Education - 01/15/21 1405    Education Details HEP, POC, heel lift    Person(s) Educated Patient    Methods Explanation;Demonstration;Verbal cues;Handout    Comprehension Verbalized understanding;Returned demonstration;Need further instruction            PT Short Term Goals -  01/15/21 1426      PT SHORT TERM GOAL #1   Title Pt will be I and  compliant with HEP    Time 4    Period Weeks    Status New      PT SHORT TERM GOAL #2   Title Pt will perform FOTO assessment and then we will write LTG for this    Time 4    Period Weeks    Status New             PT Long Term Goals - 01/15/21 1440      PT LONG TERM GOAL #1   Title Pt will report increased standing/gait tolerance    Time 8    Period Weeks    Status New    Target Date 03/12/21      PT LONG TERM GOAL #2   Title Pt will improve FOTO core to predicted value.    Time 8    Period Weeks    Status New      PT LONG TERM GOAL #3   Title Pt will improve lumbar-thoracic ROM to [facility] .    Time 8    Period Weeks                  Plan - 01/15/21 1412    Clinical Impression Statement Pt presents with chronic Rt sided LBP S/P long fusion 2015, scoliosis, myofascial pain syndrome, posture and gait abnormality. He also has 1 inch leg length descrepency with Lt leg being shorter so provided him with heel lift today to trial and this did appear to help with his posture some. He had Rt THA in 2014. He will benefit from skilled PT to address his funcitonal deficits below and decrease overall pain and tightness.    Personal Factors and Comorbidities Comorbidity 3+    Comorbidities PMH: long stem fusion thoracic down to L5  2015$ due to scoliosis, severe anx and dep, ETOH abuse, COPD,TIA,Rt hip THA 2014    Examination-Activity Limitations Bend;Carry;Lift;Stand;Stairs;Squat;Sleep;Locomotion Level    Examination-Participation Restrictions Cleaning;Community Activity;Driving;Shop;Laundry;Yard Work    Merchant navy officer Evolving/Moderate complexity    Clinical Decision Making Moderate    Rehab Potential Good    PT Frequency 2x / week    PT Duration 8 weeks    PT Treatment/Interventions ADLs/Self Care Home Management;Aquatic Therapy;Cryotherapy;Electrical Stimulation;Iontophoresis 4mg /ml Dexamethasone;Moist Heat;Therapeutic activities;Therapeutic  exercise;Neuromuscular re-education;Patient/family education;Manual techniques;Passive range of motion;Dry needling;Taping;Joint Manipulations;Spinal Manipulations    PT Next Visit Plan Do FOTO. how is heel lift? how is HEP? caution and be gentle in perform any spinal mobs or manual traction as he has long fusion throacic to L5. Needs posture, gait, overall strength/endurance    PT Home Exercise Plan Access Code: [email or web address]    Consulted and Agree with Plan of Care Patient           Patient will benefit from skilled therapeutic intervention in order to improve the following deficits and impairments:  Abnormal gait,Decreased activity tolerance,Decreased balance,Decreased endurance,Decreased mobility,Decreased strength,Decreased range of motion,Difficulty walking,Hypomobility,Postural dysfunction,Impaired flexibility,Pain  Visit Diagnosis: Chronic bilateral low back pain without sciatica  Other abnormalities of gait and mobility  Muscle weakness (generalized)  Abnormal posture     Problem List Patient Active Problem List   Diagnosis Date Noted  . Fusion of spine, thoracolumbar region 01/13/2021  . Abnormal gait 01/13/2021  . PSA elevation 11/01/2018  . Continuous chronic alcoholism ([place]) 12/05/2013  . CAFL (chronic airflow limitation) ([place]) 12/05/2013  . Acid reflux  12/05/2013  . H/O malignant neoplasm of prostate 12/05/2013  . History of repair of hip joint 12/05/2013  . H/O transient cerebral ischemia 12/05/2013  . Chronic obstructive pulmonary disease ([place]) 12/05/2013  . Idiopathic scoliosis and kyphoscoliosis 08/21/2013  . Lumbar canal stenosis 08/21/2013  . 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[place]) 04/16/2013  . Pre-op evaluation 04/06/2013  . Essential hypertension 04/06/2013  . Dyslipidemia 04/06/2013    Silvestre Mesi 01/15/2021, 2:45 PM  [facility] [address] [place], [place], [address] Phone: [phone]   Fax:   [phone]  Name: Jesse Robbins MRN: [id number] Date of Birth: 11-29-1940

## 2021-01-15 NOTE — Patient Instructions (Signed)
Access Code: [email or web address] URL: [email or web address] Date: 01/15/2021 Prepared by: Elsie Ra  Exercises Lateral Shift Correction at [place] - 2 x daily - 6 x weekly - 2-3 sets - 10 reps Hooklying Single Knee to Chest Stretch - 2 x daily - 6 x weekly - 1 sets - 2 reps - 30 hold Supine Lower Trunk Rotation - 2 x daily - 6 x weekly - 1 sets - 10 reps - 5 sec hold Supine Bridge - 2 x daily - 6 x weekly - 1-2 sets - 10 reps - 5 hold Standing Row with Anchored Resistance - 2 x daily - 6 x weekly - 2-3 sets - 10-20 reps Shoulder extension with resistance - Neutral - 2 x daily - 6 x weekly - 2-3 sets - 10-20 reps Mini Squat with Chair - 2 x daily - 6 x weekly - 3 sets - 10 reps

## 2021-01-18 ENCOUNTER — Encounter: Payer: Medicare Other | Admitting: Rehabilitative and Restorative Service Providers"

## 2021-02-02 ENCOUNTER — Ambulatory Visit (INDEPENDENT_AMBULATORY_CARE_PROVIDER_SITE_OTHER): Payer: Medicare Other | Admitting: Physical Therapy

## 2021-02-02 ENCOUNTER — Other Ambulatory Visit: Payer: Self-pay

## 2021-02-02 DIAGNOSIS — R2689 Other abnormalities of gait and mobility: Secondary | ICD-10-CM

## 2021-02-02 DIAGNOSIS — M6281 Muscle weakness (generalized): Secondary | ICD-10-CM | POA: Diagnosis not present

## 2021-02-02 DIAGNOSIS — R293 Abnormal posture: Secondary | ICD-10-CM

## 2021-02-02 DIAGNOSIS — G8929 Other chronic pain: Secondary | ICD-10-CM

## 2021-02-02 DIAGNOSIS — M545 Low back pain, unspecified: Secondary | ICD-10-CM | POA: Diagnosis not present

## 2021-02-02 NOTE — Therapy (Signed)
[facility] Physical Therapy [address] [place], [place], [address] Phone: [phone]   Fax:  [phone]  Physical Therapy Treatment  Patient Details  Name: Jesse Robbins MRN: [id number] Date of Birth: October 12, 1941 Referring Provider (PT): [name], MD   Encounter Date: 02/02/2021   PT End of Session - 02/02/21 1626    Visit Number 2    Number of Visits 16    Date for PT Re-Evaluation 03/12/21    PT Start Time 6945    PT Stop Time 1445    PT Time Calculation (min) 40 min    Activity Tolerance Patient tolerated treatment well    Behavior During Therapy [facility] for tasks assessed/performed           Past Medical History:  Diagnosis Date  . Alcohol abuse   . Anemia   . Anxiety   . Arthritis   . Cervical spinal stenosis   . Chronic gastritis   . COPD (chronic obstructive pulmonary disease) ([place])   . Depression   . Diverticulosis   . Headache(784.0)    migraine  . HTN (hypertension)   . Hyperlipidemia   . Lumbar spinal stenosis   . Prostate cancer ([place]) 2004   seed implant and beam radiation  . Stroke [facility])    2010 TIA  . TIA (transient ischemic attack) 06/2009    Past Surgical History:  Procedure Laterality Date  . APPENDECTOMY  1952  . COLONOSCOPY     dr Earlean Shawl around 2017  . INSERTION PROSTATE RADIATION SEED    . RHINOPLASTY    . SPINE SURGERY    . TONSILLECTOMY  1950  . TOTAL HIP ARTHROPLASTY Right 04/16/2013   Procedure: RIGHT TOTAL HIP ARTHROPLASTY ANTERIOR APPROACH;  Surgeon: Mcarthur Rossetti, MD;  Location: [place];  Service: Orthopedics;  Laterality: Right;    There were no vitals filed for this visit.   Subjective Assessment - 02/02/21 1627    Subjective He relays not having much back pain unless he stands or walks too long. limited to walking about one block. He likes the exericses for home but has not done them as often as prescribed. He also relays he tried heelift but it did not seem to help.    Pertinent History PMH: long  stem fusion thoracic down to L5  2015$ due to scoliosis, severe anx and dep, ETOH abuse, COPD,TIA,Rt hip THA 2014    Limitations Lifting;Standing;Walking;House hold activities    How long can you stand comfortably? up to 30 minutes    How long can you walk comfortably? limited community distances    Diagnostic tests see MRI report in chart    Patient Stated Goals improve standing and walking and posture    Pain Onset More than a month ago              [facility] PT Assessment - 02/02/21 0001      Assessment   Medical Diagnosis chronic Rt sided LBP S/P long fusion 2015?, scoliosis, myofascial pain syndrome, posture and gait abnormality.    Referring Provider (PT) [name], MD      Observation/Other Assessments   Focus on Therapeutic Outcomes (FOTO)  38% functional, goal is 52%            [facility] Adult PT Treatment/Exercise - 02/02/21 0001      Lumbar Exercises: Stretches   Single Knee to Chest Stretch Right;Left;2 reps;30 seconds    Lower Trunk Rotation 5 reps;10 seconds      Lumbar Exercises:  Aerobic   Nustep L5 X12 min UE/LE      Lumbar Exercises: Machines for Strengthening   Leg Press 100 lbs 3X10      Lumbar Exercises: Standing   Functional Squats 10 reps    Functional Squats Limitations chair touch    Row Both;15 reps    Theraband Level (Row) Level 3 (Green)    Shoulder Extension Both;15 reps    Theraband Level (Shoulder Extension) Level 3 (Green)    Other Standing Lumbar Exercises Rt lateral shift correction left arm on wall 2X10    Other Standing Lumbar Exercises bilat hip abd, red X15 ea, antirotation walk outs red 6 bilat      Lumbar Exercises: Supine   Dead Bug 10 reps    Bridge 15 reps;5 seconds                    PT Short Term Goals - 02/02/21 1701      PT SHORT TERM GOAL #1   Title Pt will be I and compliant with HEP    Time 4    Period Weeks    Status On-going      PT SHORT TERM GOAL #2   Title Pt will perform FOTO assessment and then we  will write LTG for this    Time 4    Period Weeks    Status Achieved             PT Long Term Goals - 02/02/21 1701      PT LONG TERM GOAL #1   Title Pt will report increased standing/gait tolerance    Time 8    Period Weeks    Status New      PT LONG TERM GOAL #2   Title Pt will improve FOTO core to predicted value of 52% functional    Time 8    Period Weeks    Status New      PT LONG TERM GOAL #3   Title Pt will improve lumbar-thoracic ROM to [facility] .    Time 8    Period Weeks                 Plan - 02/02/21 1626    Clinical Impression Statement Session focused on HEP review and he needed moderate cuing for proper technique. Then worked to improve overall strength and conditioning and he had good overall tolrance to this. Continue POC.    Personal Factors and Comorbidities Comorbidity 3+    Comorbidities PMH: long stem fusion thoracic down to L5  2015 due to scoliosis, severe anx and dep, ETOH abuse, COPD,TIA,Rt hip THA 2014    Examination-Activity Limitations Bend;Carry;Lift;Stand;Stairs;Squat;Sleep;Locomotion Level    Examination-Participation Restrictions Cleaning;Community Activity;Driving;Shop;Laundry;Yard Work    Merchant navy officer Evolving/Moderate complexity    Rehab Potential Good    PT Frequency 2x / week    PT Duration 8 weeks    PT Treatment/Interventions ADLs/Self Care Home Management;Aquatic Therapy;Cryotherapy;Electrical Stimulation;Iontophoresis 4mg /ml Dexamethasone;Moist Heat;Therapeutic activities;Therapeutic exercise;Neuromuscular re-education;Patient/family education;Manual techniques;Passive range of motion;Dry needling;Taping;Joint Manipulations;Spinal Manipulations    PT Next Visit Plan how is heel lift? how is HEP? caution and be gentle in perform any spinal mobs or manual traction as he has long fusion throacic to L5. Needs posture, gait, overall strength/endurance    PT Home Exercise Plan Access Code: [email or web address]    Consulted  and Agree with Plan of Care Patient           Patient will benefit from skilled  therapeutic intervention in order to improve the following deficits and impairments:  Abnormal gait,Decreased activity tolerance,Decreased balance,Decreased endurance,Decreased mobility,Decreased strength,Decreased range of motion,Difficulty walking,Hypomobility,Postural dysfunction,Impaired flexibility,Pain  Visit Diagnosis: Chronic bilateral low back pain without sciatica  Other abnormalities of gait and mobility  Muscle weakness (generalized)  Abnormal posture     Problem List Patient Active Problem List   Diagnosis Date Noted  . Fusion of spine, thoracolumbar region 01/13/2021  . Abnormal gait 01/13/2021  . PSA elevation 11/01/2018  . Continuous chronic alcoholism ([place]) 12/05/2013  . CAFL (chronic airflow limitation) ([place]) 12/05/2013  . Acid reflux 12/05/2013  . H/O malignant neoplasm of prostate 12/05/2013  . History of repair of hip joint 12/05/2013  . H/O transient cerebral ischemia 12/05/2013  . Chronic obstructive pulmonary disease ([place]) 12/05/2013  . Idiopathic scoliosis and kyphoscoliosis 08/21/2013  . Lumbar canal stenosis 08/21/2013  . 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[place]) 04/16/2013  . Pre-op evaluation 04/06/2013  . Essential hypertension 04/06/2013  . Dyslipidemia 04/06/2013    Silvestre Mesi 02/02/2021, 5:02 PM  [facility] Physical Therapy [address] [place], [place], [address] Phone: [phone]   Fax:  [phone]  Name: Jesse Robbins MRN: [id number] Date of Birth: [birth year]

## 2021-02-05 ENCOUNTER — Encounter: Payer: Self-pay | Admitting: Rehabilitative and Restorative Service Providers"

## 2021-02-05 ENCOUNTER — Ambulatory Visit (INDEPENDENT_AMBULATORY_CARE_PROVIDER_SITE_OTHER): Payer: Medicare Other | Admitting: Rehabilitative and Restorative Service Providers"

## 2021-02-05 ENCOUNTER — Other Ambulatory Visit: Payer: Self-pay

## 2021-02-05 DIAGNOSIS — R293 Abnormal posture: Secondary | ICD-10-CM | POA: Diagnosis not present

## 2021-02-05 DIAGNOSIS — M6281 Muscle weakness (generalized): Secondary | ICD-10-CM

## 2021-02-05 DIAGNOSIS — R2689 Other abnormalities of gait and mobility: Secondary | ICD-10-CM | POA: Diagnosis not present

## 2021-02-05 DIAGNOSIS — M545 Low back pain, unspecified: Secondary | ICD-10-CM | POA: Diagnosis not present

## 2021-02-05 DIAGNOSIS — G8929 Other chronic pain: Secondary | ICD-10-CM

## 2021-02-05 NOTE — Therapy (Signed)
[facility] [address] [place], [place], [address] Phone: [phone]   Fax:  [phone]  Physical Therapy Treatment  Patient Details  Name: Jesse Robbins MRN: [id number] Date of Birth: 01-19-1941 Referring Provider (PT): [name], MD   Encounter Date: 02/05/2021   PT End of Session - 02/05/21 1344    Visit Number 3    Number of Visits 16    Date for PT Re-Evaluation 03/12/21    Authorization Type Medicare and AARP    Progress Note Due on Visit 10    PT Start Time 1344    PT Stop Time 1423    PT Time Calculation (min) 39 min    Activity Tolerance Patient tolerated treatment well    Behavior During Therapy [facility] for tasks assessed/performed           Past Medical History:  Diagnosis Date  . Alcohol abuse   . Anemia   . Anxiety   . Arthritis   . Cervical spinal stenosis   . Chronic gastritis   . COPD (chronic obstructive pulmonary disease) ([place])   . Depression   . Diverticulosis   . Headache(784.0)    migraine  . HTN (hypertension)   . Hyperlipidemia   . Lumbar spinal stenosis   . Prostate cancer ([place]) 2004   seed implant and beam radiation  . Stroke [facility])    2010 TIA  . TIA (transient ischemic attack) 06/2009    Past Surgical History:  Procedure Laterality Date  . APPENDECTOMY  1952  . COLONOSCOPY     dr Earlean Shawl around 2017  . INSERTION PROSTATE RADIATION SEED    . RHINOPLASTY    . SPINE SURGERY    . TONSILLECTOMY  1950  . TOTAL HIP ARTHROPLASTY Right 04/16/2013   Procedure: RIGHT TOTAL HIP ARTHROPLASTY ANTERIOR APPROACH;  Surgeon: Mcarthur Rossetti, MD;  Location: [place];  Service: Orthopedics;  Laterality: Right;    There were no vitals filed for this visit.   Subjective Assessment - 02/05/21 1348    Subjective Pt. indicated he had Rt sided back soreness (not too bad rating).  Pt. stated his concern about walking prolonged being troublesome and looks forward to attempting it more.    Pertinent History PMH: long  stem fusion thoracic down to L5  2015$ due to scoliosis, severe anx and dep, ETOH abuse, COPD,TIA,Rt hip THA 2014    Limitations Lifting;Standing;Walking;House hold activities    How long can you stand comfortably? up to 30 minutes    How long can you walk comfortably? limited community distances    Diagnostic tests see MRI report in chart    Patient Stated Goals improve standing and walking and posture    Pain Score 1     Pain Location Back    Pain Descriptors / Indicators Aching    Pain Type Chronic pain    Pain Onset More than a month ago    Pain Frequency Intermittent    Aggravating Factors  walking, standing    Pain Relieving Factors rest              [facility] PT Assessment - 02/05/21 0001      Posture/Postural Control   Posture Comments Continued visual presentation of Lt trunk shift in standing, walking posture and evidence of scoliosis                         [facility] Adult PT Treatment/Exercise - 02/05/21 0001  Lumbar Exercises: Stretches   Single Knee to Chest Stretch 30 seconds;3 reps;Left;Right      Lumbar Exercises: Aerobic   Nustep Lvl 6 10 mins      Lumbar Exercises: Machines for Strengthening   Leg Press 100 lbs 3X10      Lumbar Exercises: Standing   Row Both   2 x 15   Theraband Level (Row) Level 3 (Green)    Shoulder Extension Both   2 x 15   Theraband Level (Shoulder Extension) Level 3 (Green)    Other Standing Lumbar Exercises Rt lateral shift correction left arm on wall x10    Other Standing Lumbar Exercises bilateral hip abduction, extension kick green band x 10 each, lateral stepping green band cues for HEP      Lumbar Exercises: Supine   Bridge 5 seconds;10 reps                  PT Education - 02/05/21 1402    Education Details Added lateral stepping for HEP, cues for posture throughout    Person(s) Educated Patient    Methods Explanation;Demonstration;Verbal cues;Handout    Comprehension Verbalized  understanding;Returned demonstration            PT Short Term Goals - 02/02/21 1701      PT SHORT TERM GOAL #1   Title Pt will be I and compliant with HEP    Time 4    Period Weeks    Status On-going      PT SHORT TERM GOAL #2   Title Pt will perform FOTO assessment and then we will write LTG for this    Time 4    Period Weeks    Status Achieved             PT Long Term Goals - 02/02/21 1701      PT LONG TERM GOAL #1   Title Pt will report increased standing/gait tolerance    Time 8    Period Weeks    Status New      PT LONG TERM GOAL #2   Title Pt will improve FOTO core to predicted value of 52% functional    Time 8    Period Weeks    Status New      PT LONG TERM GOAL #3   Title Pt will improve lumbar-thoracic ROM to [facility] .    Time 8    Period Weeks                 Plan - 02/05/21 1412    Clinical Impression Statement Very evident postural deviations continue to show in sitting, standing/walking posture.  Gave advice to continue to use heel lift for leg length correction.  Cues in intervention for upright posture throughout.  Continued skilled PT services indicated at this time.    Personal Factors and Comorbidities Comorbidity 3+    Comorbidities PMH: long stem fusion thoracic down to L5  2015 due to scoliosis, severe anx and dep, ETOH abuse, COPD,TIA,Rt hip THA 2014    Examination-Activity Limitations Bend;Carry;Lift;Stand;Stairs;Squat;Sleep;Locomotion Level    Examination-Participation Restrictions Cleaning;Community Activity;Driving;Shop;Laundry;Yard Work    [employer]chant [employer] officer Evolving/Moderate complexity    Rehab Potential Good    PT Frequency 2x / week    PT Duration 8 weeks    PT Treatment/Interventions ADLs/Self Care Home Management;Aquatic Therapy;Cryotherapy;Electrical Stimulation;Iontophoresis 4mg /ml Dexamethasone;Moist Heat;Therapeutic activities;Therapeutic exercise;Neuromuscular re-education;Patient/family education;Manual  techniques;Passive range of motion;Dry needling;Taping;Joint Manipulations;Spinal Manipulations    PT Next Visit Plan Review heel  lift use and HEP.  Continue postural strengthening/activation and general exercise improvements for goal of walking.    PT Home Exercise Plan Access Code: [email or web address]    Consulted and Agree with Plan of Care Patient           Patient will benefit from skilled therapeutic intervention in order to improve the following deficits and impairments:  Abnormal gait,Decreased activity tolerance,Decreased balance,Decreased endurance,Decreased mobility,Decreased strength,Decreased range of motion,Difficulty walking,Hypomobility,Postural dysfunction,Impaired flexibility,Pain  Visit Diagnosis: Chronic bilateral low back pain without sciatica  Other abnormalities of gait and mobility  Muscle weakness (generalized)  Abnormal posture     Problem List Patient Active Problem List   Diagnosis Date Noted  . Fusion of spine, thoracolumbar region 01/13/2021  . Abnormal gait 01/13/2021  . PSA elevation 11/01/2018  . Continuous chronic alcoholism ([place]) 12/05/2013  . CAFL (chronic airflow limitation) ([place]) 12/05/2013  . Acid reflux 12/05/2013  . H/O malignant neoplasm of prostate 12/05/2013  . History of repair of hip joint 12/05/2013  . H/O transient cerebral ischemia 12/05/2013  . Chronic obstructive pulmonary disease ([place]) 12/05/2013  . Idiopathic scoliosis and kyphoscoliosis 08/21/2013  . Lumbar canal stenosis 08/21/2013  . 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[place]) 04/16/2013  . Pre-op evaluation 04/06/2013  . Essential hypertension 04/06/2013  . Dyslipidemia 04/06/2013   Scot Jun, PT, DPT, OCS, ATC 02/05/21  2:15 PM    [facility] [address] [place], [place], [address] Phone: [phone]   Fax:  [phone]  Name: NAITIK HERMANN MRN: [id number] Date of Birth: [birth year]

## 2021-02-05 NOTE — Patient Instructions (Signed)
Access Code: [email or web address] URL: [email or web address] Date: 02/05/2021 Prepared by: Scot Jun  Exercises Lateral Shift Correction at [place] - 2 x daily - 6 x weekly - 2-3 sets - 10 reps Hooklying Single Knee to Chest Stretch - 2 x daily - 6 x weekly - 1 sets - 2 reps - 30 hold Supine Lower Trunk Rotation - 2 x daily - 6 x weekly - 1 sets - 10 reps - 5 sec hold Supine Bridge - 2 x daily - 6 x weekly - 1-2 sets - 10 reps - 5 hold Standing Row with Anchored Resistance - 2 x daily - 6 x weekly - 2-3 sets - 10-20 reps Shoulder extension with resistance - Neutral - 2 x daily - 6 x weekly - 2-3 sets - 10-20 reps Mini Squat with Chair - 2 x daily - 6 x weekly - 3 sets - 10 reps Side Stepping with Resistance at Ankles - 1 x daily - 6 x weekly - 3 sets - 10 reps

## 2021-02-10 ENCOUNTER — Encounter: Payer: Medicare Other | Admitting: Physical Therapy

## 2021-02-12 ENCOUNTER — Other Ambulatory Visit: Payer: Self-pay

## 2021-02-12 ENCOUNTER — Encounter: Payer: Self-pay | Admitting: Rehabilitative and Restorative Service Providers"

## 2021-02-12 ENCOUNTER — Ambulatory Visit (INDEPENDENT_AMBULATORY_CARE_PROVIDER_SITE_OTHER): Payer: Medicare Other | Admitting: Rehabilitative and Restorative Service Providers"

## 2021-02-12 DIAGNOSIS — G8929 Other chronic pain: Secondary | ICD-10-CM

## 2021-02-12 DIAGNOSIS — M545 Low back pain, unspecified: Secondary | ICD-10-CM

## 2021-02-12 DIAGNOSIS — M6281 Muscle weakness (generalized): Secondary | ICD-10-CM

## 2021-02-12 DIAGNOSIS — R293 Abnormal posture: Secondary | ICD-10-CM

## 2021-02-12 DIAGNOSIS — R2689 Other abnormalities of gait and mobility: Secondary | ICD-10-CM

## 2021-02-12 NOTE — Therapy (Signed)
[facility] Physical Therapy [address] [place], [place], [address] Phone: [phone]   Fax:  [phone]  Physical Therapy Treatment  Patient Details  Name: Jesse Robbins MRN: [id number] Date of Birth: 1941-05-23 Referring Provider (PT): [name], MD   Encounter Date: 02/12/2021   PT End of Session - 02/12/21 1343    Visit Number 4    Number of Visits 16    Date for PT Re-Evaluation 03/12/21    Authorization Type Medicare and AARP    Progress Note Due on Visit 10    PT Start Time 5625    PT Stop Time 1425    PT Time Calculation (min) 40 min    Activity Tolerance Patient tolerated treatment well    Behavior During Therapy [facility] for tasks assessed/performed           Past Medical History:  Diagnosis Date  . Alcohol abuse   . Anemia   . Anxiety   . Arthritis   . Cervical spinal stenosis   . Chronic gastritis   . COPD (chronic obstructive pulmonary disease) ([place])   . Depression   . Diverticulosis   . Headache(784.0)    migraine  . HTN (hypertension)   . Hyperlipidemia   . Lumbar spinal stenosis   . Prostate cancer ([place]) 2004   seed implant and beam radiation  . Stroke [facility])    2010 TIA  . TIA (transient ischemic attack) 06/2009    Past Surgical History:  Procedure Laterality Date  . APPENDECTOMY  1952  . COLONOSCOPY     dr Earlean Shawl around 2017  . INSERTION PROSTATE RADIATION SEED    . RHINOPLASTY    . SPINE SURGERY    . TONSILLECTOMY  1950  . TOTAL HIP ARTHROPLASTY Right 04/16/2013   Procedure: RIGHT TOTAL HIP ARTHROPLASTY ANTERIOR APPROACH;  Surgeon: Mcarthur Rossetti, MD;  Location: [place];  Service: Orthopedics;  Laterality: Right;    There were no vitals filed for this visit.   Subjective Assessment - 02/12/21 1349    Subjective Pt. stated no pain upon arrival today.  Pt. stated morning in upper back seems to be more painful but gets better with some time nad movement.  Sometimes feeling achy after execises.  Mainly doing them  1x/day    Pertinent History PMH: long stem fusion thoracic down to L5  2015$ due to scoliosis, severe anx and dep, ETOH abuse, COPD,TIA,Rt hip THA 2014    Limitations Lifting;Standing;Walking;House hold activities    How long can you stand comfortably? up to 30 minutes    How long can you walk comfortably? limited community distances    Diagnostic tests see MRI report in chart    Patient Stated Goals improve standing and walking and posture    Currently in Pain? No/denies    Pain Score 0-No pain   pain at 4-5 (moderate)   Pain Location Back    Pain Descriptors / Indicators Aching    Pain Type Chronic pain    Pain Onset More than a month ago    Pain Frequency Intermittent    Aggravating Factors  morning time, standing/walking    Pain Relieving Factors rest                             [facility] Adult PT Treatment/Exercise - 02/12/21 0001      Lumbar Exercises: Stretches   Single Knee to Chest Stretch 3 reps;30 seconds;Left;Right  Lumbar Exercises: Aerobic   Nustep Lvl 6 12 mins      Lumbar Exercises: Standing   Row Both   3  x10   Theraband Level (Row) Level 3 (Green)    Shoulder Extension Both   3 x 10   Theraband Level (Shoulder Extension) Level 3 (Green)    Other Standing Lumbar Exercises bilateral hip ext, abd kicks x 15 each c hands on counter      Lumbar Exercises: Supine   Dead Bug 20 reps    Bridge 15 reps;5 seconds                    PT Short Term Goals - 02/02/21 1701      PT SHORT TERM GOAL #1   Title Pt will be I and compliant with HEP    Time 4    Period Weeks    Status On-going      PT SHORT TERM GOAL #2   Title Pt will perform FOTO assessment and then we will write LTG for this    Time 4    Period Weeks    Status Achieved             PT Long Term Goals - 02/02/21 1701      PT LONG TERM GOAL #1   Title Pt will report increased standing/gait tolerance    Time 8    Period Weeks    Status New      PT LONG TERM  GOAL #2   Title Pt will improve FOTO core to predicted value of 52% functional    Time 8    Period Weeks    Status New      PT LONG TERM GOAL #3   Title Pt will improve lumbar-thoracic ROM to [facility] .    Time 8    Period Weeks                 Plan - 02/12/21 1417    Clinical Impression Statement Continued cues for postural positioning as best achieved at this time.  Continued plan for mobility gains and overall strength for postural support.  Continued skilled PT services indicated at this time.    Personal Factors and Comorbidities Comorbidity 3+    Comorbidities PMH: long stem fusion thoracic down to L5  2015 due to scoliosis, severe anx and dep, ETOH abuse, COPD,TIA,Rt hip THA 2014    Examination-Activity Limitations Bend;Carry;Lift;Stand;Stairs;Squat;Sleep;Locomotion Level    Examination-Participation Restrictions Cleaning;Community Activity;Driving;Shop;Laundry;Yard Work    [employer]chant [employer] officer Evolving/Moderate complexity    Rehab Potential Good    PT Frequency 2x / week    PT Duration 8 weeks    PT Treatment/Interventions ADLs/Self Care Home Management;Aquatic Therapy;Cryotherapy;Electrical Stimulation;Iontophoresis 4mg /ml Dexamethasone;Moist Heat;Therapeutic activities;Therapeutic exercise;Neuromuscular re-education;Patient/family education;Manual techniques;Passive range of motion;Dry needling;Taping;Joint Manipulations;Spinal Manipulations    PT Next Visit Plan Continue postural strengthening/activation and general exercise improvements for goal of walking.    PT Home Exercise Plan Access Code: [email or web address]    Consulted and Agree with Plan of Care Patient           Patient will benefit from skilled therapeutic intervention in order to improve the following deficits and impairments:  Abnormal gait,Decreased activity tolerance,Decreased balance,Decreased endurance,Decreased mobility,Decreased strength,Decreased range of motion,Difficulty  walking,Hypomobility,Postural dysfunction,Impaired flexibility,Pain  Visit Diagnosis: Chronic bilateral low back pain without sciatica  Other abnormalities of gait and mobility  Muscle weakness (generalized)  Abnormal posture     Problem List Patient Active Problem List  Diagnosis Date Noted  . Fusion of spine, thoracolumbar region 01/13/2021  . Abnormal gait 01/13/2021  . PSA elevation 11/01/2018  . Continuous chronic alcoholism ([place]) 12/05/2013  . CAFL (chronic airflow limitation) ([place]) 12/05/2013  . Acid reflux 12/05/2013  . H/O malignant neoplasm of prostate 12/05/2013  . History of repair of hip joint 12/05/2013  . H/O transient cerebral ischemia 12/05/2013  . Chronic obstructive pulmonary disease ([place]) 12/05/2013  . Idiopathic scoliosis and kyphoscoliosis 08/21/2013  . Lumbar canal stenosis 08/21/2013  . 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[place]) 04/16/2013  . Pre-op evaluation 04/06/2013  . Essential hypertension 04/06/2013  . Dyslipidemia 04/06/2013   Scot Jun, PT, DPT, OCS, ATC 02/12/21  2:22 PM    [facility] [address] [place], [place], [address] Phone: [phone]   Fax:  [phone]  Name: Jesse Robbins MRN: [id number] Date of Birth: 16-Jul-1941

## 2021-02-15 ENCOUNTER — Ambulatory Visit (INDEPENDENT_AMBULATORY_CARE_PROVIDER_SITE_OTHER): Payer: Medicare Other | Admitting: Physical Therapy

## 2021-02-15 ENCOUNTER — Other Ambulatory Visit: Payer: Self-pay

## 2021-02-15 DIAGNOSIS — R293 Abnormal posture: Secondary | ICD-10-CM | POA: Diagnosis not present

## 2021-02-15 DIAGNOSIS — M545 Low back pain, unspecified: Secondary | ICD-10-CM

## 2021-02-15 DIAGNOSIS — G8929 Other chronic pain: Secondary | ICD-10-CM

## 2021-02-15 DIAGNOSIS — M6281 Muscle weakness (generalized): Secondary | ICD-10-CM

## 2021-02-15 DIAGNOSIS — R2689 Other abnormalities of gait and mobility: Secondary | ICD-10-CM | POA: Diagnosis not present

## 2021-02-15 NOTE — Therapy (Signed)
[facility] Physical Therapy [address] [place], [place], [address] Phone: [phone]   Fax:  [phone]  Physical Therapy Treatment  Patient Details  Name: Jesse Robbins MRN: [id number] Date of Birth: 04-07-1941 Referring Provider (PT): Jesse Patches, MD   Encounter Date: 02/15/2021   PT End of Session - 02/15/21 1505    Visit Number 5    Number of Visits 16    Date for PT Re-Evaluation 03/12/21    Authorization Type Medicare and AARP    Progress Note Due on Visit 10    PT Start Time 1430    PT Stop Time 1511    PT Time Calculation (min) 41 min    Activity Tolerance Patient tolerated treatment well    Behavior During Therapy [facility] for tasks assessed/performed           Past Medical History:  Diagnosis Date  . Alcohol abuse   . Anemia   . Anxiety   . Arthritis   . Cervical spinal stenosis   . Chronic gastritis   . COPD (chronic obstructive pulmonary disease) ([place])   . Depression   . Diverticulosis   . Headache(784.0)    migraine  . HTN (hypertension)   . Hyperlipidemia   . Lumbar spinal stenosis   . Prostate cancer ([place]) 2004   seed implant and beam radiation  . Stroke [facility])    2010 TIA  . TIA (transient ischemic attack) 06/2009    Past Surgical History:  Procedure Laterality Date  . APPENDECTOMY  1952  . COLONOSCOPY     dr Earlean Shawl around 2017  . INSERTION PROSTATE RADIATION SEED    . RHINOPLASTY    . SPINE SURGERY    . TONSILLECTOMY  1950  . TOTAL HIP ARTHROPLASTY Right 04/16/2013   Procedure: RIGHT TOTAL HIP ARTHROPLASTY ANTERIOR APPROACH;  Surgeon: Mcarthur Rossetti, MD;  Location: [place] [facility];  Service: Orthopedics;  Laterality: Right;    There were no vitals filed for this visit.   Subjective Assessment - 02/15/21 1449    Subjective Pt. stated no pain upon arrival today. Some of the exercises he has to stand just right or he may have back pain with them.    Pertinent History PMH: long stem fusion thoracic down to L5  2015$ due to  scoliosis, severe anx and dep, ETOH abuse, COPD,TIA,Rt hip THA 2014    Limitations Lifting;Standing;Walking;House hold activities    How long can you stand comfortably? up to 30 minutes    How long can you walk comfortably? limited community distances    Diagnostic tests see MRI report in chart    Patient Stated Goals improve standing and walking and posture    Pain Onset More than a month ago                             [facility] Adult PT Treatment/Exercise - 02/15/21 0001      Lumbar Exercises: Stretches   Single Knee to Chest Stretch Right;Left;2 reps;30 seconds    Lower Trunk Rotation 5 reps;10 seconds      Lumbar Exercises: Aerobic   Nustep L6 X10 min      Lumbar Exercises: Machines for Strengthening   Leg Press 112 lbs 3X10      Lumbar Exercises: Standing   Row Both    Theraband Level (Row) Level 3 (Green)    Row Limitations 3X15 reps    Shoulder Extension Both    Theraband  Level (Shoulder Extension) Level 3 (Green)    Shoulder Extension Limitations 3X10 reps    Other Standing Lumbar Exercises bilateral hip ext, abd kicks x 15 each c hands on counter and 2# cuff weights around ankles      Lumbar Exercises: Supine   Clam 20 reps    Clam Limitations blue    Dead Bug 20 reps    Bridge 10 reps;5 seconds    Bridge Limitations 2 sets                    PT Short Term Goals - 02/02/21 1701      PT SHORT TERM GOAL #1   Title Pt will be I and compliant with HEP    Time 4    Period Weeks    Status On-going      PT SHORT TERM GOAL #2   Title Pt will perform FOTO assessment and then we will write LTG for this    Time 4    Period Weeks    Status Achieved             PT Long Term Goals - 02/02/21 1701      PT LONG TERM GOAL #1   Title Pt will report increased standing/gait tolerance    Time 8    Period Weeks    Status New      PT LONG TERM GOAL #2   Title Pt will improve FOTO core to predicted value of 52% functional    Time 8     Period Weeks    Status New      PT LONG TERM GOAL #3   Title Pt will improve lumbar-thoracic ROM to [facility] .    Time 8    Period Weeks                 Plan - 02/15/21 1506    Clinical Impression Statement He was able to progress resistance and or reps today with strenghening program with good overall tolerance. We will continue to work to improve his funcitonal abiilities.    Personal Factors and Comorbidities Comorbidity 3+    Comorbidities PMH: long stem fusion thoracic down to L5  2015 due to scoliosis, severe anx and dep, ETOH abuse, COPD,TIA,Rt hip THA 2014    Examination-Activity Limitations Bend;Carry;Lift;Stand;Stairs;Squat;Sleep;Locomotion Level    Examination-Participation Restrictions Cleaning;Community Activity;Driving;Shop;Laundry;Yard Work    [employer]chant [employer] officer Evolving/Moderate complexity    Rehab Potential Good    PT Frequency 2x / week    PT Duration 8 weeks    PT Treatment/Interventions ADLs/Self Care Home Management;Aquatic Therapy;Cryotherapy;Electrical Stimulation;Iontophoresis 4mg /ml Dexamethasone;Moist Heat;Therapeutic activities;Therapeutic exercise;Neuromuscular re-education;Patient/family education;Manual techniques;Passive range of motion;Dry needling;Taping;Joint Manipulations;Spinal Manipulations    PT Next Visit Plan Continue postural strengthening/activation and general exercise improvements for goal of walking.    PT Home Exercise Plan Access Code: [email or web address]    Consulted and Agree with Plan of Care Patient           Patient will benefit from skilled therapeutic intervention in order to improve the following deficits and impairments:  Abnormal gait,Decreased activity tolerance,Decreased balance,Decreased endurance,Decreased mobility,Decreased strength,Decreased range of motion,Difficulty walking,Hypomobility,Postural dysfunction,Impaired flexibility,Pain  Visit Diagnosis: Chronic bilateral low back pain without sciatica  Other  abnormalities of gait and mobility  Muscle weakness (generalized)  Abnormal posture     Problem List Patient Active Problem List   Diagnosis Date Noted  . Fusion of spine, thoracolumbar region 01/13/2021  . Abnormal gait 01/13/2021  . PSA  elevation 11/01/2018  . Continuous chronic alcoholism ([place]) 12/05/2013  . CAFL (chronic airflow limitation) ([place]) 12/05/2013  . Acid reflux 12/05/2013  . H/O malignant neoplasm of prostate 12/05/2013  . History of repair of hip joint 12/05/2013  . H/O transient cerebral ischemia 12/05/2013  . Chronic obstructive pulmonary disease ([place]) 12/05/2013  . Idiopathic scoliosis and kyphoscoliosis 08/21/2013  . Lumbar canal stenosis 08/21/2013  . 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[place]) 04/16/2013  . Pre-op evaluation 04/06/2013  . Essential hypertension 04/06/2013  . Dyslipidemia 04/06/2013    Silvestre Mesi 02/15/2021, 3:09 PM  [facility] [address] [place], [place], [address] Phone: [phone]   Fax:  [phone]  Name: Jesse Robbins MRN: [id number] Date of Birth: 1941-09-21

## 2021-02-17 ENCOUNTER — Ambulatory Visit: Payer: Self-pay

## 2021-02-17 ENCOUNTER — Other Ambulatory Visit: Payer: Self-pay

## 2021-02-17 ENCOUNTER — Encounter: Payer: Self-pay | Admitting: Specialist

## 2021-02-17 ENCOUNTER — Ambulatory Visit (INDEPENDENT_AMBULATORY_CARE_PROVIDER_SITE_OTHER): Payer: Medicare Other | Admitting: Specialist

## 2021-02-17 VITALS — BP 148/74 | HR 80 | Ht 67.0 in | Wt 166.0 lb

## 2021-02-17 DIAGNOSIS — R2689 Other abnormalities of gait and mobility: Secondary | ICD-10-CM

## 2021-02-17 DIAGNOSIS — G959 Disease of spinal cord, unspecified: Secondary | ICD-10-CM | POA: Diagnosis not present

## 2021-02-17 DIAGNOSIS — M4325 Fusion of spine, thoracolumbar region: Secondary | ICD-10-CM

## 2021-02-17 DIAGNOSIS — M545 Low back pain, unspecified: Secondary | ICD-10-CM | POA: Diagnosis not present

## 2021-02-17 DIAGNOSIS — M4802 Spinal stenosis, cervical region: Secondary | ICD-10-CM

## 2021-02-17 DIAGNOSIS — M4014 Other secondary kyphosis, thoracic region: Secondary | ICD-10-CM

## 2021-02-17 DIAGNOSIS — Z981 Arthrodesis status: Secondary | ICD-10-CM

## 2021-02-17 DIAGNOSIS — M4804 Spinal stenosis, thoracic region: Secondary | ICD-10-CM

## 2021-02-17 DIAGNOSIS — M4153 Other secondary scoliosis, cervicothoracic region: Secondary | ICD-10-CM

## 2021-02-17 MED ORDER — DIAZEPAM 5 MG PO TABS: ORAL_TABLET | ORAL | 0 refills | Status: DC

## 2021-02-17 NOTE — Patient Instructions (Signed)
Plan: Avoid bending, stooping and avoid lifting weights greater than 10 lbs. Avoid prolong standing and walking. Avoid frequent bending and stooping  No lifting greater than 10 lbs. May use ice or moist heat for pain. Weight loss is of benefit. Handicap license is approved. MRI of the thoracic and cervical spine is ordered to assess for cord compression above thoracolumbar fusion.  Valium for use with MRI take at the MRI after signing consent. Avoid overhead lifting and overhead use of the arms. Do not lift greater than 5 lbs. Adjust head rest in vehicle to prevent hyperextension if rear ended. Take extra precautions to avoid falling.Fall Prevention and Home Safety Falls cause injuries and can affect all age groups. It is possible to use preventive measures to significantly decrease the likelihood of falls. There are many simple measures which can make your home safer and prevent falls. OUTDOORS  Repair cracks and edges of walkways and driveways.  Remove high doorway thresholds.  Trim shrubbery on the main path into your home.  Have good outside lighting.  Clear walkways of tools, rocks, debris, and clutter.  Check that handrails are not broken and are securely fastened. Both sides of steps should have handrails.  Have leaves, snow, and ice cleared regularly.  Use sand or salt on walkways during winter months.  In the garage, clean up grease or oil spills. BATHROOM  Install night lights.  Install grab bars by the toilet and in the tub and shower.  Use non-skid mats or decals in the tub or shower.  Place a plastic non-slip stool in the shower to sit on, if needed.  Keep floors dry and clean up all water on the floor immediately.  Remove soap buildup in the tub or shower on a regular basis.  Secure bath mats with non-slip, double-sided rug tape.  Remove throw rugs and tripping hazards from the floors. BEDROOMS  Install night lights.  Make sure a bedside light is  easy to reach.  Do not use oversized bedding.  Keep a telephone by your bedside.  Have a firm chair with side arms to use for getting dressed.  Remove throw rugs and tripping hazards from the floor. KITCHEN  Keep handles on pots and pans turned toward the center of the stove. Use back burners when possible.  Clean up spills quickly and allow time for drying.  Avoid walking on wet floors.  Avoid hot utensils and knives.  Position shelves so they are not too high or low.  Place commonly used objects within easy reach.  If necessary, use a sturdy step stool with a grab bar when reaching.  Keep electrical cables out of the way.  Do not use floor polish or wax that makes floors slippery. If you must use wax, use non-skid floor wax.  Remove throw rugs and tripping hazards from the floor. STAIRWAYS  Never leave objects on stairs.  Place handrails on both sides of stairways and use them. Fix any loose handrails. Make sure handrails on both sides of the stairways are as long as the stairs.  Check carpeting to make sure it is firmly attached along stairs. Make repairs to worn or loose carpet promptly.  Avoid placing throw rugs at the top or bottom of stairways, or properly secure the rug with carpet tape to prevent slippage. Get rid of throw rugs, if possible.  Have an electrician put in a light switch at the top and bottom of the stairs. OTHER FALL PREVENTION TIPS  Wear low-heel  or rubber-soled shoes that are supportive and fit well. Wear closed toe shoes.  When using a stepladder, make sure it is fully opened and both spreaders are firmly locked. Do not climb a closed stepladder.  Add color or contrast paint or tape to grab bars and handrails in your home. Place contrasting color strips on first and last steps.  Learn and use mobility aids as needed. Install an electrical emergency response system.  Turn on lights to avoid dark areas. Replace light bulbs that burn out  immediately. Get light switches that glow.  Arrange furniture to create clear pathways. Keep furniture in the same place.  Firmly attach carpet with non-skid or double-sided tape.  Eliminate uneven floor surfaces.  Select a carpet pattern that does not visually hide the edge of steps.  Be aware of all pets. OTHER HOME SAFETY TIPS  Set the water temperature for 120 F (48.8 C).  Keep emergency numbers on or near the telephone.  Keep smoke detectors on every level of the home and near sleeping areas. Document Released: 09/23/2002 Document Revised: 04/03/2012 Document Reviewed: 12/23/2011 [facility] Patient Information 2014 [place].

## 2021-02-17 NOTE — Progress Notes (Signed)
Office Visit Note   Patient: Jesse Robbins           Date of Birth: Sep 06, 1941           MRN: [id number] Visit Date: 02/17/2021              Requested by: Jesse Sinning, MD [address] [place],  [place] [address] PCP: Jesse Late, MD   Assessment & Plan: Visit Diagnoses:  1. Low back pain, unspecified back pain laterality, unspecified chronicity, unspecified whether sciatica present   2. 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 Arthrodesis status   4. Balance disorder   5. Other secondary scoliosis, cervicothoracic region   6. Spinal stenosis of thoracic region   7. Spinal stenosis of cervical region   8. Other secondary kyphosis, thoracic region   9. Fusion of spine of thoracolumbar region     Plan: Avoid bending, stooping and avoid lifting weights greater than 10 lbs. Avoid prolong standing and walking. Avoid frequent bending and stooping  No lifting greater than 10 lbs. May use ice or moist heat for pain. Weight loss is of benefit. Handicap license is approved. MRI of the thoracic and cervical spine is ordered to assess for cord compression above thoracolumbar fusion.  Valium for use with MRI take at the MRI after signing consent. Avoid overhead lifting and overhead use of the arms. Do not lift greater than 5 lbs. Adjust head rest in vehicle to prevent hyperextension if rear ended. Take extra precautions to avoid falling.Fall Prevention and Home Safety Falls cause injuries and can affect all age groups. It is possible to use preventive measures to significantly decrease the likelihood of falls. There are many simple measures which can make your home safer and prevent falls. OUTDOORS  Repair cracks and edges of walkways and driveways.  Remove high doorway thresholds.  Trim shrubbery on the main path into your home.  Have good outside lighting.  Clear walkways of tools, rocks, debris, and clutter.  Check that handrails are not broken and are securely fastened. Both  sides of steps should have handrails.  Have leaves, snow, and ice cleared regularly.  Use sand or salt on walkways during winter months.  In the garage, clean up grease or oil spills. BATHROOM  Install night lights.  Install grab bars by the toilet and in the tub and shower.  Use non-skid mats or decals in the tub or shower.  Place a plastic non-slip stool in the shower to sit on, if needed.  Keep floors dry and clean up all water on the floor immediately.  Remove soap buildup in the tub or shower on a regular basis.  Secure bath mats with non-slip, double-sided rug tape.  Remove throw rugs and tripping hazards from the floors. BEDROOMS  Install night lights.  Make sure a bedside light is easy to reach.  Do not use oversized bedding.  Keep a telephone by your bedside.  Have a firm chair with side arms to use for getting dressed.  Remove throw rugs and tripping hazards from the floor. KITCHEN  Keep handles on pots and pans turned toward the center of the stove. Use back burners when possible.  Clean up spills quickly and allow time for drying.  Avoid walking on wet floors.  Avoid hot utensils and knives.  Position shelves so they are not too high or low.  Place commonly used objects within easy reach.  If necessary, use a sturdy step stool with a grab  bar when reaching.  Keep electrical cables out of the way.  Do not use floor polish or wax that makes floors slippery. If you must use wax, use non-skid floor wax.  Remove throw rugs and tripping hazards from the floor. STAIRWAYS  Never leave objects on stairs.  Place handrails on both sides of stairways and use them. Fix any loose handrails. Make sure handrails on both sides of the stairways are as long as the stairs.  Check carpeting to make sure it is firmly attached along stairs. Make repairs to worn or loose carpet promptly.  Avoid placing throw rugs at the top or bottom of stairways, or properly  secure the rug with carpet tape to prevent slippage. Get rid of throw rugs, if possible.  Have an electrician put in a light switch at the top and bottom of the stairs. OTHER FALL PREVENTION TIPS  Wear low-heel or rubber-soled shoes that are supportive and fit well. Wear closed toe shoes.  When using a stepladder, make sure it is fully opened and both spreaders are firmly locked. Do not climb a closed stepladder.  Add color or contrast paint or tape to grab bars and handrails in your home. Place contrasting color strips on first and last steps.  Learn and use mobility aids as needed. Install an electrical emergency response system.  Turn on lights to avoid dark areas. Replace light bulbs that burn out immediately. Get light switches that glow.  Arrange furniture to create clear pathways. Keep furniture in the same place.  Firmly attach carpet with non-skid or double-sided tape.  Eliminate uneven floor surfaces.  Select a carpet pattern that does not visually hide the edge of steps.  Be aware of all pets. OTHER HOME SAFETY TIPS  Set the water temperature for 120 F (48.8 C).  Keep emergency numbers on or near the telephone.  Keep smoke detectors on every level of the home and near sleeping areas. Document Released: 09/23/2002 Document Revised: 04/03/2012 Document Reviewed: 12/23/2011 [facility] Patient Information 2014 [place].     Follow-Up Instructions: No follow-ups on file.   Orders:  Orders Placed This Encounter  Procedures  . XR Lumb Spine Flex&Ext Only   No orders of the defined types were placed in this encounter.     Procedures: No procedures performed   Clinical Data: No additional findings.   Subjective: Chief Complaint  Patient presents with  . Lower Back - Pain    NP--referred by Jesse Robbins.    80 year old male with history of thoracolumbar fusion by Jesse Robbins Plan at [facility] 5 years ago. He has pain with standing and walking into the  thoracolumbar junction, at about 1/16th of a mile of being upright. No bowel or bladder difficulty. Has scar to the lower thoracic spine in the mildline. He rarely has night pain. Takes tylenol for discomfort and 1800-2400 mg of gabapentin daily. He notes a curvature of the spine with a tendency to list to the left side with the upper back. He has been PT and had exercises, he is going to our facility and was last seen Monday. There is  Some deconditioned in the legs and arms. Sometimes the legs feel weak getting up from bed.    Review of Systems  Constitutional: Positive for chills, fatigue and fever.  HENT: Positive for congestion, postnasal drip, rhinorrhea, sinus pressure, sinus pain, sneezing and trouble swallowing. Negative for sore throat and voice change.   Eyes: Positive for visual disturbance (retinal problem to  be evaluated ). Negative for photophobia, pain, discharge, redness and itching.  Respiratory: Positive for cough and shortness of breath.   Cardiovascular: Negative.  Negative for chest pain, palpitations and leg swelling.  Gastrointestinal: Positive for blood in stool. Negative for abdominal distention, abdominal pain, anal bleeding, constipation, diarrhea and nausea.  Endocrine: Positive for cold intolerance. Negative for heat intolerance.  Genitourinary: Negative.  Negative for difficulty urinating, dysuria, enuresis, flank pain, frequency, genital sores and hematuria.  Musculoskeletal: Positive for back pain and gait problem. Negative for arthralgias, joint swelling, myalgias, neck pain and neck stiffness.  Skin: Negative.  Negative for color change, pallor, rash and wound.  Allergic/Immunologic: Positive for environmental allergies. Negative for food allergies and immunocompromised state.  Neurological: Positive for speech difficulty (With TIA), light-headedness (Had a TIA), numbness and headaches (migraines, visual ). Negative for dizziness, tremors, syncope and facial  asymmetry.  Hematological: Negative.  Negative for adenopathy. Does not bruise/bleed easily.  Psychiatric/Behavioral: Negative.  Negative for agitation, behavioral problems, confusion, decreased concentration, dysphoric mood, hallucinations, self-injury, sleep disturbance and suicidal ideas. The patient is not nervous/anxious and is not hyperactive.      Objective: Vital Signs: BP (!) 148/74 (BP Location: Left Arm, Patient Position: Sitting)   Pulse 80   Ht 5\' 7"  (1.702 m)   Wt 166 lb (75.3 kg)   BMI 26.00 kg/m   Physical Exam Constitutional:      Appearance: He is well-developed.  HENT:     Head: Normocephalic and atraumatic.  Eyes:     Pupils: Pupils are equal, round, and reactive to light.  Pulmonary:     Effort: Pulmonary effort is normal.     Breath sounds: Normal breath sounds.  Abdominal:     General: Bowel sounds are normal.     Palpations: Abdomen is soft.  Musculoskeletal:        General: Normal range of motion.     Cervical back: Normal range of motion and neck supple.  Skin:    General: Skin is warm and dry.  Neurological:     Mental Status: He is alert and oriented to person, place, and time.  Psychiatric:        Behavior: Behavior normal.        Thought Content: Thought content normal.        Judgment: Judgment normal.     Ortho Exam  Specialty Comments:  No specialty comments available.  Imaging: No results found.   PMFS History: Patient Active Problem List   Diagnosis Date Noted  . Fusion of spine, thoracolumbar region 01/13/2021  . Abnormal gait 01/13/2021  . PSA elevation 11/01/2018  . Continuous chronic alcoholism (HCC) 12/05/2013  . CAFL (chronic airflow limitation) (HCC) 12/05/2013  . Acid reflux 12/05/2013  . H/O malignant neoplasm of prostate 12/05/2013  . History of repair of hip joint 12/05/2013  . H/O transient cerebral ischemia 12/05/2013  . Chronic obstructive pulmonary disease (HCC) 12/05/2013  . Idiopathic scoliosis and  kyphoscoliosis 08/21/2013  . Lumbar canal stenosis 08/21/2013  . 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HCC) 04/16/2013  . Pre-op evaluation 04/06/2013  . Essential hypertension 04/06/2013  . Dyslipidemia 04/06/2013   Past Medical History:  Diagnosis Date  . Alcohol abuse   . Anemia   . Anxiety   . Arthritis   . Cervical spinal stenosis   . Chronic gastritis   . COPD (chronic obstructive pulmonary disease) (HCC)   . Depression   . Diverticulosis   . Headache(784.0)  migraine  . HTN (hypertension)   . Hyperlipidemia   . Lumbar spinal stenosis   . 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 Stroke [facility])    2010 TIA  . TIA (transient ischemic attack) 06/2009    Family History  Problem Relation Age of Onset  . Colon cancer Mother   . Lung cancer Mother   . Stroke Father   . Prostate cancer Father   . Cancer Maternal Grandfather        lung  . Heart attack Brother   . Hypertension Brother        hyperlipidemia  . Esophageal cancer Neg Hx     Past Surgical History:  Procedure Laterality Date  . APPENDECTOMY  1952  . COLONOSCOPY     dr Earlean Shawl around 2017  . INSERTION PROSTATE RADIATION SEED    . RHINOPLASTY    . SPINE SURGERY    . TONSILLECTOMY  1950  . TOTAL HIP ARTHROPLASTY Right 04/16/2013   Procedure: RIGHT TOTAL HIP ARTHROPLASTY ANTERIOR APPROACH;  Surgeon: Mcarthur Rossetti, MD;  Location: [facility];  Service: Orthopedics;  Laterality: Right;   Social History   Occupational History  . Not on file  Tobacco Use  . Smoking status: Former Smoker    Packs/day: 1.00    Years: 25.00    Pack years: 25.00    Quit date: 04/05/1998    Years since quitting: 22.8  . Smokeless tobacco: Never Used  Vaping Use  . Vaping Use: Never used  Substance and Sexual Activity  . Alcohol use: Yes    Alcohol/week: 7.0 standard drinks    Types: 7 Shots of liquor per week    Comment: 6 drinks every day; stopped as of 10/2020  . Drug use: Yes    Types:  Marijuana    Comment: less then 1/2 gm a day  . Sexual activity: Not on file

## 2021-02-18 ENCOUNTER — Ambulatory Visit (INDEPENDENT_AMBULATORY_CARE_PROVIDER_SITE_OTHER): Payer: Medicare Other | Admitting: Physical Therapy

## 2021-02-18 DIAGNOSIS — M545 Low back pain, unspecified: Secondary | ICD-10-CM | POA: Diagnosis not present

## 2021-02-18 DIAGNOSIS — M6281 Muscle weakness (generalized): Secondary | ICD-10-CM

## 2021-02-18 DIAGNOSIS — R2689 Other abnormalities of gait and mobility: Secondary | ICD-10-CM

## 2021-02-18 DIAGNOSIS — R293 Abnormal posture: Secondary | ICD-10-CM | POA: Diagnosis not present

## 2021-02-18 DIAGNOSIS — G8929 Other chronic pain: Secondary | ICD-10-CM

## 2021-02-18 NOTE — Therapy (Signed)
[facility] Physical Therapy [address] [place], [place], [address] Phone: [phone]   Fax:  [phone]  Physical Therapy Treatment  Patient Details  Name: Jesse Robbins MRN: [id number] Date of Birth: 1940/11/15 Referring Provider (PT): [name], MD   Encounter Date: 02/18/2021   PT End of Session - 02/18/21 1429    Visit Number 6    Number of Visits 16    Date for PT Re-Evaluation 03/12/21    Authorization Type Medicare and AARP    Progress Note Due on Visit 10    PT Start Time 1350    PT Stop Time 1430    PT Time Calculation (min) 40 min    Activity Tolerance Patient tolerated treatment well    Behavior During Therapy [facility] for tasks assessed/performed           Past Medical History:  Diagnosis Date  . Alcohol abuse   . Anemia   . Anxiety   . Arthritis   . Cervical spinal stenosis   . Chronic gastritis   . COPD (chronic obstructive pulmonary disease) ([place])   . Depression   . Diverticulosis   . Headache(784.0)    migraine  . HTN (hypertension)   . Hyperlipidemia   . Lumbar spinal stenosis   . 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 Stroke [facility])    2010 TIA  . TIA (transient ischemic attack) 06/2009    Past Surgical History:  Procedure Laterality Date  . APPENDECTOMY  1952  . COLONOSCOPY     dr Earlean Shawl around 2017  . INSERTION PROSTATE RADIATION SEED    . RHINOPLASTY    . SPINE SURGERY    . TONSILLECTOMY  1950  . TOTAL HIP ARTHROPLASTY Right 04/16/2013   Procedure: RIGHT TOTAL HIP ARTHROPLASTY ANTERIOR APPROACH;  Surgeon: Mcarthur Rossetti, MD;  Location: [facility];  Service: Orthopedics;  Laterality: Right;    There were no vitals filed for this visit.   Subjective Assessment - 02/18/21 1402    Subjective Pt. stated MD wants him to have MRI of thoracic/neck area. He has been having pain first thing in the morning and with prolonged standing. he also relays he is supposed to avoid reaching overhead. About 4/10  back pain overall rated today.    Pertinent History PMH: long stem fusion thoracic down to L5  2015$ due to scoliosis, severe anx and dep, ETOH abuse, COPD,TIA,Rt hip THA 2014    Limitations Lifting;Standing;Walking;House hold activities    How long can you stand comfortably? up to 30 minutes    How long can you walk comfortably? limited community distances    Diagnostic tests see MRI report in chart    Patient Stated Goals improve standing and walking and posture    Pain Onset More than a month ago            [facility] Adult PT Treatment/Exercise - 02/18/21 0001      Lumbar Exercises: Stretches   Single Knee to Chest Stretch Right;Left;2 reps;30 seconds    Lower Trunk Rotation 5 reps;10 seconds      Lumbar Exercises: Machines for Strengthening   Leg Press 112 3X15    Other Lumbar Machine Exercise row machine 25# 3X10      Lumbar Exercises: Standing   Other Standing Lumbar Exercises suitcase carry 5# one lap around gym ea side    Other Standing Lumbar Exercises bilateral hip ext, abd kicks x 15 each wiith UE support and  2# cuff weights around ankles      Lumbar Exercises: Supine   Clam 20 reps    Clam Limitations blue    Bridge 10 reps;5 seconds    Bridge Limitations 2 sets                    PT Short Term Goals - 02/02/21 1701      PT SHORT TERM GOAL #1   Title Pt will be I and compliant with HEP    Time 4    Period Weeks    Status On-going      PT SHORT TERM GOAL #2   Title Pt will perform FOTO assessment and then we will write LTG for this    Time 4    Period Weeks    Status Achieved             PT Long Term Goals - 02/02/21 1701      PT LONG TERM GOAL #1   Title Pt will report increased standing/gait tolerance    Time 8    Period Weeks    Status New      PT LONG TERM GOAL #2   Title Pt will improve FOTO core to predicted value of 52% functional    Time 8    Period Weeks    Status New      PT LONG TERM GOAL #3   Title Pt will improve  lumbar-thoracic ROM to [facility] .    Time 8    Period Weeks                 Plan - 02/18/21 1430    Clinical Impression Statement Added suicase carry and row machine for additional back/core strength today and he had good tolerance to this. MD has ordered MRI for cervical/thoracic so will await results from this and continue POC in the meantime.    Personal Factors and Comorbidities Comorbidity 3+    Comorbidities PMH: long stem fusion thoracic down to L5  2015 due to scoliosis, severe anx and dep, ETOH abuse, COPD,TIA,Rt hip THA 2014    Examination-Activity Limitations Bend;Carry;Lift;Stand;Stairs;Squat;Sleep;Locomotion Level    Examination-Participation Restrictions Cleaning;Community Activity;Driving;Shop;Laundry;Yard Work    [employer] Evolving/Moderate complexity    Rehab Potential Good    PT Frequency 2x / week    PT Duration 8 weeks    PT Treatment/Interventions ADLs/Self Care Home Management;Aquatic Therapy;Cryotherapy;Electrical Stimulation;Iontophoresis 4mg /ml Dexamethasone;Moist Heat;Therapeutic activities;Therapeutic exercise;Neuromuscular re-education;Patient/family education;Manual techniques;Passive range of motion;Dry needling;Taping;Joint Manipulations;Spinal Manipulations    PT Next Visit Plan Continue postural strengthening/activation and general exercise improvements for goal of walking.    PT Home Exercise Plan Access Code: [email or web address]    Consulted and Agree with Plan of Care Patient           Patient will benefit from skilled therapeutic intervention in order to improve the following deficits and impairments:  Abnormal gait,Decreased activity tolerance,Decreased balance,Decreased endurance,Decreased mobility,Decreased strength,Decreased range of motion,Difficulty walking,Hypomobility,Postural dysfunction,Impaired flexibility,Pain  Visit Diagnosis: Chronic bilateral low back pain without sciatica  Other abnormalities of gait and  mobility  Muscle weakness (generalized)  Abnormal posture     Problem List Patient Active Problem List   Diagnosis Date Noted  . Fusion of spine, thoracolumbar region 01/13/2021  . Abnormal gait 01/13/2021  . PSA elevation 11/01/2018  . Continuous chronic alcoholism ([place]) 12/05/2013  . CAFL (chronic airflow limitation) ([place]) 12/05/2013  . Acid reflux 12/05/2013  . H/O malignant neoplasm of prostate 12/05/2013  .  History of repair of hip joint 12/05/2013  . H/O transient cerebral ischemia 12/05/2013  . Chronic obstructive pulmonary disease ([place]) 12/05/2013  . Idiopathic scoliosis and kyphoscoliosis 08/21/2013  . Lumbar canal stenosis 08/21/2013  . 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[place]) 04/16/2013  . Pre-op evaluation 04/06/2013  . Essential hypertension 04/06/2013  . Dyslipidemia 04/06/2013    Silvestre Mesi 02/18/2021, 3:27 PM  [facility] Physical Therapy [address] [place], [place], [address] Phone: [phone]   Fax:  [phone]  Name: Jesse Robbins MRN: [id number] Date of Birth: 01-10-1941

## 2021-02-23 ENCOUNTER — Telehealth: Payer: Self-pay | Admitting: Physical Therapy

## 2021-02-23 ENCOUNTER — Encounter: Payer: Medicare Other | Admitting: Physical Therapy

## 2021-02-23 NOTE — Telephone Encounter (Signed)
Spoke with pt regarding his missed PT appointment today. He did not know he had one scheduled.  He was reminded of his next appointment on the 17th.  Pt asked about his MRI, he was informed that the orders are in the chart and suggested he call MD office to find out why he hasn't heard from anyone.  Jeral Pinch, PT 02/23/21 2:05 PM

## 2021-03-01 ENCOUNTER — Other Ambulatory Visit: Payer: Self-pay

## 2021-03-01 ENCOUNTER — Encounter (INDEPENDENT_AMBULATORY_CARE_PROVIDER_SITE_OTHER): Payer: Medicare Other | Admitting: Ophthalmology

## 2021-03-01 DIAGNOSIS — H353112 Nonexudative age-related macular degeneration, right eye, intermediate dry stage: Secondary | ICD-10-CM

## 2021-03-01 DIAGNOSIS — H43813 Vitreous degeneration, bilateral: Secondary | ICD-10-CM

## 2021-03-01 DIAGNOSIS — H353221 Exudative age-related macular degeneration, left eye, with active choroidal neovascularization: Secondary | ICD-10-CM | POA: Diagnosis not present

## 2021-03-01 DIAGNOSIS — I10 Essential (primary) hypertension: Secondary | ICD-10-CM

## 2021-03-01 DIAGNOSIS — H35033 Hypertensive retinopathy, bilateral: Secondary | ICD-10-CM | POA: Diagnosis not present

## 2021-03-02 ENCOUNTER — Encounter: Payer: Medicare Other | Admitting: Physical Therapy

## 2021-03-02 ENCOUNTER — Ambulatory Visit (INDEPENDENT_AMBULATORY_CARE_PROVIDER_SITE_OTHER): Payer: Medicare Other | Admitting: Podiatry

## 2021-03-02 DIAGNOSIS — M79674 Pain in right toe(s): Secondary | ICD-10-CM

## 2021-03-02 DIAGNOSIS — M79675 Pain in left toe(s): Secondary | ICD-10-CM | POA: Diagnosis not present

## 2021-03-02 DIAGNOSIS — B351 Tinea unguium: Secondary | ICD-10-CM | POA: Diagnosis not present

## 2021-03-03 ENCOUNTER — Encounter: Payer: Self-pay | Admitting: Podiatry

## 2021-03-03 NOTE — Progress Notes (Signed)
  Subjective:  Patient ID: Jesse Robbins, male    DOB: Feb 23, 1941,  MRN: [id number]  Chief Complaint  Patient presents with  . Nail Problem    Nail fungus    80 y.o. male returns for the above complaint.  Patient presents with thickened elongated dystrophic toenails x10.  Painful to touch.  Patient would like to have them debrided down.  He denies any other acute complaints he has not really tried treating it.  He states it is painful to walk on.  He is not a diabetic  Objective:  There were no vitals filed for this visit. Podiatric Exam: Vascular: dorsalis pedis and posterior tibial pulses are palpable bilateral. Capillary return is immediate. Temperature gradient is WNL. Skin turgor WNL  Sensorium: Jesse Semmes Weinstein monofilament test. Jesse tactile sensation bilaterally. Nail Exam: Pt has thick disfigured discolored nails with subungual debris noted bilateral entire nail hallux through fifth toenails.  Pain on palpation to the nails. Ulcer Exam: There is no evidence of ulcer or pre-ulcerative changes or infection. Orthopedic Exam: Muscle tone and strength are WNL. No limitations in general ROM. No crepitus or effusions noted. HAV  B/L.  Hammer toes 2-5  B/L. Skin: No Porokeratosis. No infection or ulcers    Assessment & Plan:   1. Pain due to onychomycosis of toenails of both feet     Patient was evaluated and treated and all questions answered.  Onychomycosis with pain  -Nails palliatively debrided as below. -Educated on self-care  Procedure: Nail Debridement Rationale: pain  Type of Debridement: manual, sharp debridement. Instrumentation: Nail nipper, rotary burr. Number of Nails: 10  Procedures and Treatment: Consent by patient was obtained for treatment procedures. The patient understood the discussion of treatment and procedures well. All questions were answered thoroughly reviewed. Debridement of mycotic and hypertrophic toenails, 1 through 5 bilateral and clearing  of subungual debris. No ulceration, no infection noted.  Return Visit-Office Procedure: Patient instructed to return to the office for a follow up visit 3 months for continued evaluation and treatment.  Boneta Lucks, DPM    No follow-ups on file.

## 2021-03-04 ENCOUNTER — Ambulatory Visit (INDEPENDENT_AMBULATORY_CARE_PROVIDER_SITE_OTHER): Payer: Medicare Other | Admitting: Physical Therapy

## 2021-03-04 ENCOUNTER — Other Ambulatory Visit: Payer: Self-pay

## 2021-03-04 DIAGNOSIS — R2689 Other abnormalities of gait and mobility: Secondary | ICD-10-CM | POA: Diagnosis not present

## 2021-03-04 DIAGNOSIS — M545 Low back pain, unspecified: Secondary | ICD-10-CM

## 2021-03-04 DIAGNOSIS — G8929 Other chronic pain: Secondary | ICD-10-CM

## 2021-03-04 DIAGNOSIS — M6281 Muscle weakness (generalized): Secondary | ICD-10-CM | POA: Diagnosis not present

## 2021-03-04 DIAGNOSIS — R293 Abnormal posture: Secondary | ICD-10-CM | POA: Diagnosis not present

## 2021-03-04 NOTE — Therapy (Signed)
[facility] Physical Therapy [address] [place], [place], [address] Phone: [phone]   Fax:  [phone]  Physical Therapy Treatment  Patient Details  Name: Jesse Robbins MRN: [id number] Date of Birth: 1941-05-21 Referring Provider (PT): [name], MD   Encounter Date: 03/04/2021   PT End of Session - 03/04/21 1323    Visit Number 7    Number of Visits 16    Date for PT Re-Evaluation 03/12/21    Authorization Type Medicare and AARP    Progress Note Due on Visit 10    PT Start Time 1300    PT Stop Time 1340    PT Time Calculation (min) 40 min    Activity Tolerance Patient tolerated treatment well    Behavior During Therapy [facility] for tasks assessed/performed           Past Medical History:  Diagnosis Date  . Alcohol abuse   . Anemia   . Anxiety   . Arthritis   . Cervical spinal stenosis   . Chronic gastritis   . COPD (chronic obstructive pulmonary disease) ([place])   . Depression   . Diverticulosis   . Headache(784.0)    migraine  . HTN (hypertension)   . Hyperlipidemia   . Lumbar spinal stenosis   . Prostate cancer ([place]) 2004   seed implant and beam radiation  . Stroke [facility])    2010 TIA  . TIA (transient ischemic attack) 06/2009    Past Surgical History:  Procedure Laterality Date  . APPENDECTOMY  1952  . COLONOSCOPY     dr Earlean Shawl around 2017  . INSERTION PROSTATE RADIATION SEED    . RHINOPLASTY    . SPINE SURGERY    . TONSILLECTOMY  1950  . TOTAL HIP ARTHROPLASTY Right 04/16/2013   Procedure: RIGHT TOTAL HIP ARTHROPLASTY ANTERIOR APPROACH;  Surgeon: Mcarthur Rossetti, MD;  Location: [place];  Service: Orthopedics;  Laterality: Right;    There were no vitals filed for this visit.   Subjective Assessment - 03/04/21 1318    Subjective Pt. relays he will have MRI this weekend on thoracic. He is not having pain at rest only when he tries to perform standing activity.    Pertinent History PMH: long stem fusion thoracic down to L5  2015$  due to scoliosis, severe anx and dep, ETOH abuse, COPD,TIA,Rt hip THA 2014    Limitations Lifting;Standing;Walking;House hold activities    How long can you stand comfortably? up to 30 minutes    How long can you walk comfortably? limited community distances    Diagnostic tests see MRI report in chart    Patient Stated Goals improve standing and walking and posture    Pain Onset More than a month ago              [facility] Adult PT Treatment/Exercise - 03/04/21 0001      Lumbar Exercises: Stretches   Piriformis Stretch Right;Left;3 reps;20 seconds    Piriformis Stretch Limitations seated    Other Lumbar Stretch Exercise seated lumbar flexion stretch pball roll outs 10 sec x10      Lumbar Exercises: Machines for Strengthening   Leg Press 118# 3X10, then 50# 2X10 SL on both    Other Lumbar Machine Exercise row machine 25# 3X10, chest press machine 10# 2X10      Lumbar Exercises: Standing   Row Both;15 reps    Theraband Level (Row) Level 3 (Green)    Row Limitations unilat alternating    Shoulder Extension  15 reps    Theraband Level (Shoulder Extension) Level 3 (Green)    Shoulder Extension Limitations unilat alternating    Other Standing Lumbar Exercises suitcase carry 10# one lap around gym ea side    Other Standing Lumbar Exercises bilateral hip ext, abd kicks x 15 each wiith UE support and 3# cuff weights around ankles                    PT Short Term Goals - 02/02/21 1701      PT SHORT TERM GOAL #1   Title Pt will be I and compliant with HEP    Time 4    Period Weeks    Status On-going      PT SHORT TERM GOAL #2   Title Pt will perform FOTO assessment and then we will write LTG for this    Time 4    Period Weeks    Status Achieved             PT Long Term Goals - 02/02/21 1701      PT LONG TERM GOAL #1   Title Pt will report increased standing/gait tolerance    Time 8    Period Weeks    Status New      PT LONG TERM GOAL #2   Title Pt will  improve FOTO core to predicted value of 52% functional    Time 8    Period Weeks    Status New      PT LONG TERM GOAL #3   Title Pt will improve lumbar-thoracic ROM to [facility] .    Time 8    Period Weeks                 Plan - 03/04/21 1343    Clinical Impression Statement Continued to address his strength and mobility deficits in lumbar/thoraicic/hip with good overall activity tolerance to progression of activity. He will have MRI this weekend.    Personal Factors and Comorbidities Comorbidity 3+    Comorbidities PMH: long stem fusion thoracic down to L5  2015 due to scoliosis, severe anx and dep, ETOH abuse, COPD,TIA,Rt hip THA 2014    Examination-Activity Limitations Bend;Carry;Lift;Stand;Stairs;Squat;Sleep;Locomotion Level    Examination-Participation Restrictions Cleaning;Community Activity;Driving;Shop;Laundry;Yard Work    Merchant [employer] officer Evolving/Moderate complexity    Rehab Potential Good    PT Frequency 2x / week    PT Duration 8 weeks    PT Treatment/Interventions ADLs/Self Care Home Management;Aquatic Therapy;Cryotherapy;Electrical Stimulation;Iontophoresis 4mg /ml Dexamethasone;Moist Heat;Therapeutic activities;Therapeutic exercise;Neuromuscular re-education;Patient/family education;Manual techniques;Passive range of motion;Dry needling;Taping;Joint Manipulations;Spinal Manipulations    PT Next Visit Plan Continue postural strengthening/activation and general exercise improvements for goal of walking.    PT Home Exercise Plan Access Code: [email or web address]    Consulted and Agree with Plan of Care Patient           Patient will benefit from skilled therapeutic intervention in order to improve the following deficits and impairments:  Abnormal gait,Decreased activity tolerance,Decreased balance,Decreased endurance,Decreased mobility,Decreased strength,Decreased range of motion,Difficulty walking,Hypomobility,Postural dysfunction,Impaired flexibility,Pain  Visit  Diagnosis: Chronic bilateral low back pain without sciatica  Other abnormalities of gait and mobility  Muscle weakness (generalized)  Abnormal posture     Problem List Patient Active Problem List   Diagnosis Date Noted  . Fusion of spine, thoracolumbar region 01/13/2021  . Abnormal gait 01/13/2021  . PSA elevation 11/01/2018  . Continuous chronic alcoholism ([place]) 12/05/2013  . CAFL (chronic airflow limitation) ([place]) 12/05/2013  . Acid reflux  12/05/2013  . H/O malignant neoplasm of prostate 12/05/2013  . History of repair of hip joint 12/05/2013  . H/O transient cerebral ischemia 12/05/2013  . Chronic obstructive pulmonary disease ([place]) 12/05/2013  . Idiopathic scoliosis and kyphoscoliosis 08/21/2013  . Lumbar canal stenosis 08/21/2013  . 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[place]) 04/16/2013  . Pre-op evaluation 04/06/2013  . Essential hypertension 04/06/2013  . Dyslipidemia 04/06/2013    Debbe Odea ,PT,DPT 03/04/2021, 1:44 PM  [facility] Physical Therapy [address] [place], [place], [address] Phone: [phone]   Fax:  [phone]  Name: Jesse Robbins MRN: [id number] Date of Birth: Mar 16, 1941

## 2021-03-05 ENCOUNTER — Other Ambulatory Visit: Payer: Medicare Other

## 2021-03-07 ENCOUNTER — Other Ambulatory Visit: Payer: Self-pay

## 2021-03-07 ENCOUNTER — Ambulatory Visit
Admission: RE | Admit: 2021-03-07 | Discharge: 2021-03-07 | Disposition: A | Discharge status: Home / Self Care | Payer: Medicare Other | Source: Ambulatory Visit | Attending: Specialist | Admitting: Specialist

## 2021-03-07 DIAGNOSIS — G959 Disease of spinal cord, unspecified: Secondary | ICD-10-CM

## 2021-03-07 DIAGNOSIS — Z981 Arthrodesis status: Secondary | ICD-10-CM

## 2021-03-09 ENCOUNTER — Ambulatory Visit (INDEPENDENT_AMBULATORY_CARE_PROVIDER_SITE_OTHER): Payer: Medicare Other | Admitting: Physical Therapy

## 2021-03-09 ENCOUNTER — Other Ambulatory Visit: Payer: Self-pay

## 2021-03-09 DIAGNOSIS — R2689 Other abnormalities of gait and mobility: Secondary | ICD-10-CM

## 2021-03-09 DIAGNOSIS — R293 Abnormal posture: Secondary | ICD-10-CM | POA: Diagnosis not present

## 2021-03-09 DIAGNOSIS — M6281 Muscle weakness (generalized): Secondary | ICD-10-CM | POA: Diagnosis not present

## 2021-03-09 DIAGNOSIS — G8929 Other chronic pain: Secondary | ICD-10-CM

## 2021-03-09 DIAGNOSIS — M545 Low back pain, unspecified: Secondary | ICD-10-CM

## 2021-03-09 NOTE — Therapy (Signed)
[facility] Physical Therapy [address] [place], [place], [address] Phone: [phone]   Fax:  [phone]  Physical Therapy Treatment  Patient Details  Name: Jesse Robbins MRN: [id number] Date of Birth: [birth year] Referring Provider (PT): [name], MD   Encounter Date: 03/09/2021   PT End of Session - 03/09/21 1415    Visit Number 8    Number of Visits 16    Date for PT Re-Evaluation 03/12/21    Authorization Type Medicare and AARP    Progress Note Due on Visit 10    PT Start Time 1350    PT Stop Time 1430    PT Time Calculation (min) 40 min    Activity Tolerance Patient tolerated treatment well    Behavior During Therapy [facility] for tasks assessed/performed           Past Medical History:  Diagnosis Date  . Alcohol abuse   . Anemia   . Anxiety   . Arthritis   . Cervical spinal stenosis   . Chronic gastritis   . COPD (chronic obstructive pulmonary disease) ([place])   . Depression   . Diverticulosis   . Headache(784.0)    migraine  . HTN (hypertension)   . Hyperlipidemia   . Lumbar spinal stenosis   . Prostate cancer ([place]) 2004   seed implant and beam radiation  . Stroke [facility])    2010 TIA  . TIA (transient ischemic attack) 06/2009    Past Surgical History:  Procedure Laterality Date  . APPENDECTOMY  1952  . COLONOSCOPY     dr Earlean Shawl around 2017  . INSERTION PROSTATE RADIATION SEED    . RHINOPLASTY    . SPINE SURGERY    . TONSILLECTOMY  1950  . TOTAL HIP ARTHROPLASTY Right 04/16/2013   Procedure: RIGHT TOTAL HIP ARTHROPLASTY ANTERIOR APPROACH;  Surgeon: Mcarthur Rossetti, MD;  Location: [place];  Service: Orthopedics;  Laterality: Right;    There were no vitals filed for this visit.   Subjective Assessment - 03/09/21 1414    Subjective Pt. relays he does not have much pain unless he does activity then it is about 4 to 5/10.    Pertinent History PMH: long stem fusion thoracic down to L5  2015$ due to scoliosis, severe anx and dep, ETOH  abuse, COPD,TIA,Rt hip THA 2014    Limitations Lifting;Standing;Walking;House hold activities    How long can you stand comfortably? up to 30 minutes    How long can you walk comfortably? limited community distances    Diagnostic tests see MRI report in chart    Patient Stated Goals improve standing and walking and posture    Pain Onset More than a month ago           [facility] Adult PT Treatment/Exercise - 03/09/21 0001      Lumbar Exercises: Stretches   Double Knee to Chest Stretch 10 seconds    Double Knee to Chest Stretch Limitations 10 reps with feet on pball    Lower Trunk Rotation 5 reps;10 seconds    Piriformis Stretch Left;Right;2 reps;30 seconds    Piriformis Stretch Limitations seated    Other Lumbar Stretch Exercise seated lumbar flexion stretch pball roll outs 10 sec x10 fwd, then 10 sec X 5 diagonal both ways      Lumbar Exercises: Aerobic   Nustep L6 X10 min      Lumbar Exercises: Machines for Strengthening   Leg Press 125# 3X10 bilat, then 75# X 15  SL on  both    Other Lumbar Machine Exercise --      Lumbar Exercises: Standing   Row Both;20 reps    Theraband Level (Row) Level 3 (Green)    Row Limitations unilat alternating    Shoulder Extension 20 reps    Theraband Level (Shoulder Extension) Level 3 (Green)    Shoulder Extension Limitations unilat alternating    Other Standing Lumbar Exercises suitcase carry 10# one lap around gym ea side    Other Standing Lumbar Exercises bilateral hip ext, abd kicks x 15 each wiith UE support and 3# cuff weights around ankles      Lumbar Exercises: Supine   Bridge 10 reps;5 seconds    Bridge Limitations 2 sets             PT Short Term Goals - 02/02/21 1701      PT SHORT TERM GOAL #1   Title Pt will be I and compliant with HEP    Time 4    Period Weeks    Status On-going      PT SHORT TERM GOAL #2   Title Pt will perform FOTO assessment and then we will write LTG for this    Time 4    Period Weeks    Status  Achieved             PT Long Term Goals - 02/02/21 1701      PT LONG TERM GOAL #1   Title Pt will report increased standing/gait tolerance    Time 8    Period Weeks    Status New      PT LONG TERM GOAL #2   Title Pt will improve FOTO core to predicted value of 52% functional    Time 8    Period Weeks    Status New      PT LONG TERM GOAL #3   Title Pt will improve lumbar-thoracic ROM to [facility] .    Time 8    Period Weeks                 Plan - 03/09/21 1423    Clinical Impression Statement MRI of cervical thoracic revealed "mod to severe neural foraminal stenosis, facet arthropathy, anterolisthesis and advanced DDD" Cervical MRI revealed "severe foraminal stenosis. ". Due to the severe stenosis we showed him more facet gapping stretches to see if this helps further with his pain. Then we continued with overall strength plan with good tolerance by patient. He will meet with MD next week to discuss the MRI findings.    Personal Factors and Comorbidities Comorbidity 3+    Comorbidities PMH: long stem fusion thoracic down to L5  2015 due to scoliosis, severe anx and dep, ETOH abuse, COPD,TIA,Rt hip THA 2014    Examination-Activity Limitations Bend;Carry;Lift;Stand;Stairs;Squat;Sleep;Locomotion Level    Examination-Participation Restrictions Cleaning;Community Activity;Driving;Shop;Laundry;Yard Work    Merchant [employer] officer Evolving/Moderate complexity    Rehab Potential Good    PT Frequency 2x / week    PT Duration 8 weeks    PT Treatment/Interventions ADLs/Self Care Home Management;Aquatic Therapy;Cryotherapy;Electrical Stimulation;Iontophoresis 4mg /ml Dexamethasone;Moist Heat;Therapeutic activities;Therapeutic exercise;Neuromuscular re-education;Patient/family education;Manual techniques;Passive range of motion;Dry needling;Taping;Joint Manipulations;Spinal Manipulations    PT Next Visit Plan Continue flexion based stretching, postural strengthening/activation and  general exercise improvements for goal of walking.    PT Home Exercise Plan Access Code: [email or web address]    Consulted and Agree with Plan of Care Patient           Patient will benefit  from skilled therapeutic intervention in order to improve the following deficits and impairments:  Abnormal gait,Decreased activity tolerance,Decreased balance,Decreased endurance,Decreased mobility,Decreased strength,Decreased range of motion,Difficulty walking,Hypomobility,Postural dysfunction,Impaired flexibility,Pain  Visit Diagnosis: Chronic bilateral low back pain without sciatica  Other abnormalities of gait and mobility  Muscle weakness (generalized)  Abnormal posture     Problem List Patient Active Problem List   Diagnosis Date Noted  . Fusion of spine, thoracolumbar region 01/13/2021  . Abnormal gait 01/13/2021  . PSA elevation 11/01/2018  . Continuous chronic alcoholism ([place]) 12/05/2013  . CAFL (chronic airflow limitation) ([place]) 12/05/2013  . Acid reflux 12/05/2013  . H/O malignant neoplasm of prostate 12/05/2013  . History of repair of hip joint 12/05/2013  . H/O transient cerebral ischemia 12/05/2013  . Chronic obstructive pulmonary disease ([place]) 12/05/2013  . Idiopathic scoliosis and kyphoscoliosis 08/21/2013  . Lumbar canal stenosis 08/21/2013  . 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[place]) 04/16/2013  . Pre-op evaluation 04/06/2013  . Essential hypertension 04/06/2013  . Dyslipidemia 04/06/2013    Silvestre Mesi 03/09/2021, 2:42 PM  [facility] Physical Therapy [address] [place], [place], [address] Phone: [phone]   Fax:  [phone]  Name: Jesse Robbins MRN: [id number] Date of Birth: 03/16/1941

## 2021-03-11 ENCOUNTER — Other Ambulatory Visit: Payer: Self-pay

## 2021-03-11 ENCOUNTER — Ambulatory Visit (INDEPENDENT_AMBULATORY_CARE_PROVIDER_SITE_OTHER): Payer: Medicare Other | Admitting: Physical Therapy

## 2021-03-11 DIAGNOSIS — R293 Abnormal posture: Secondary | ICD-10-CM

## 2021-03-11 DIAGNOSIS — R2689 Other abnormalities of gait and mobility: Secondary | ICD-10-CM

## 2021-03-11 DIAGNOSIS — M545 Low back pain, unspecified: Secondary | ICD-10-CM | POA: Diagnosis not present

## 2021-03-11 DIAGNOSIS — G8929 Other chronic pain: Secondary | ICD-10-CM

## 2021-03-11 NOTE — Therapy (Signed)
[facility] Physical Therapy [address] [place], [place], [address] Phone: [phone]   Fax:  [phone]  Physical Therapy Treatment  Patient Details  Name: Jesse Robbins MRN: [id number] Date of Birth: 07-Jul-1941 Referring Provider (PT): [name], MD   Encounter Date: 03/11/2021   PT End of Session - 03/11/21 1344    Visit Number 9    Number of Visits 16    Date for PT Re-Evaluation 03/12/21    Authorization Type Medicare and AARP    Progress Note Due on Visit 10    PT Start Time 1300    PT Stop Time 1345    PT Time Calculation (min) 45 min    Activity Tolerance Patient tolerated treatment well    Behavior During Therapy [facility] for tasks assessed/performed           Past Medical History:  Diagnosis Date  . Alcohol abuse   . Anemia   . Anxiety   . Arthritis   . Cervical spinal stenosis   . Chronic gastritis   . COPD (chronic obstructive pulmonary disease) ([place])   . Depression   . Diverticulosis   . Headache(784.0)    migraine  . HTN (hypertension)   . Hyperlipidemia   . Lumbar spinal stenosis   . Prostate cancer ([place]) 2004   seed implant and beam radiation  . Stroke [facility])    2010 TIA  . TIA (transient ischemic attack) 06/2009    Past Surgical History:  Procedure Laterality Date  . APPENDECTOMY  1952  . COLONOSCOPY     dr Earlean Shawl around 2017  . INSERTION PROSTATE RADIATION SEED    . RHINOPLASTY    . SPINE SURGERY    . TONSILLECTOMY  1950  . TOTAL HIP ARTHROPLASTY Right 04/16/2013   Procedure: RIGHT TOTAL HIP ARTHROPLASTY ANTERIOR APPROACH;  Surgeon: Mcarthur Rossetti, MD;  Location: [place];  Service: Orthopedics;  Laterality: Right;    There were no vitals filed for this visit.   Subjective Assessment - 03/11/21 1310    Subjective Pt. relays no pain upon arrival, has pain first thing in the morning but then tylenol kicks in and movment helps.    Pertinent History PMH: long stem fusion thoracic down to L5  2015$ due to scoliosis,  severe anx and dep, ETOH abuse, COPD,TIA,Rt hip THA 2014    Limitations Lifting;Standing;Walking;House hold activities    How long can you stand comfortably? up to 30 minutes    How long can you walk comfortably? limited community distances    Diagnostic tests see MRI report in chart    Patient Stated Goals improve standing and walking and posture    Pain Onset More than a month ago            [facility] Adult PT Treatment/Exercise - 03/11/21 0001      Lumbar Exercises: Stretches   Double Knee to Chest Stretch 10 seconds    Double Knee to Chest Stretch Limitations 10 reps with feet on pball    Lower Trunk Rotation 5 reps;10 seconds    Piriformis Stretch Left;Right;2 reps;30 seconds    Piriformis Stretch Limitations seated    Other Lumbar Stretch Exercise seated lumbar flexion stretch pball roll outs 10 sec x10 fwd, then 10 sec X 5 diagonal both ways      Lumbar Exercises: Aerobic   UBE (Upper Arm Bike) 5 min L5 switching halfway    Recumbent Bike L4 X8 min      Lumbar Exercises: Machines  for Strengthening   Leg Press 125# 3X10 bilat, then 75# X 15  SL on both    Other Lumbar Machine Exercise row machine 25# 3X10      Lumbar Exercises: Standing   Row Both;20 reps    Theraband Level (Row) Level 3 (Green)    Shoulder Extension 20 reps    Theraband Level (Shoulder Extension) Level 3 (Green)    Other Standing Lumbar Exercises suitcase carry 10# one lap around gym ea side    Other Standing Lumbar Exercises bilateral hip ext, abd kicks x 20 each with UE support andgreen band      Lumbar Exercises: Supine   Bridge 10 reps;5 seconds    Bridge Limitations 2 sets                    PT Short Term Goals - 02/02/21 1701      PT SHORT TERM GOAL #1   Title Pt will be I and compliant with HEP    Time 4    Period Weeks    Status On-going      PT SHORT TERM GOAL #2   Title Pt will perform FOTO assessment and then we will write LTG for this    Time 4    Period Weeks    Status  Achieved             PT Long Term Goals - 02/02/21 1701      PT LONG TERM GOAL #1   Title Pt will report increased standing/gait tolerance    Time 8    Period Weeks    Status New      PT LONG TERM GOAL #2   Title Pt will improve FOTO core to predicted value of 52% functional    Time 8    Period Weeks    Status New      PT LONG TERM GOAL #3   Title Pt will improve lumbar-thoracic ROM to [facility] .    Time 8    Period Weeks                 Plan - 03/11/21 1350    Clinical Impression Statement continued with flexion based stretching for facet gapping, combined with general strength and posture with good overall tolerance but he does feel some strain with suitcase carry. He will need progress note next visit.    Personal Factors and Comorbidities Comorbidity 3+    Comorbidities PMH: long stem fusion thoracic down to L5  2015 due to scoliosis, severe anx and dep, ETOH abuse, COPD,TIA,Rt hip THA 2014    Examination-Activity Limitations Bend;Carry;Lift;Stand;Stairs;Squat;Sleep;Locomotion Level    Examination-Participation Restrictions Cleaning;Community Activity;Driving;Shop;Laundry;Yard Work    [employer]chant [employer] officer Evolving/Moderate complexity    Rehab Potential Good    PT Frequency 2x / week    PT Duration 8 weeks    PT Treatment/Interventions ADLs/Self Care Home Management;Aquatic Therapy;Cryotherapy;Electrical Stimulation;Iontophoresis 4mg /ml Dexamethasone;Moist Heat;Therapeutic activities;Therapeutic exercise;Neuromuscular re-education;Patient/family education;Manual techniques;Passive range of motion;Dry needling;Taping;Joint Manipulations;Spinal Manipulations    PT Next Visit Plan Continue flexion based stretching, postural strengthening/activation and general exercise improvements for goal of walking.    PT Home Exercise Plan Access Code: [email or web address]    Consulted and Agree with Plan of Care Patient           Patient will benefit from skilled therapeutic  intervention in order to improve the following deficits and impairments:  Abnormal gait,Decreased activity tolerance,Decreased balance,Decreased endurance,Decreased mobility,Decreased strength,Decreased range of motion,Difficulty walking,Hypomobility,Postural dysfunction,Impaired flexibility,Pain  Visit Diagnosis: Chronic bilateral low back pain without sciatica  Other abnormalities of gait and mobility  Abnormal posture     Problem List Patient Active Problem List   Diagnosis Date Noted  . Fusion of spine, thoracolumbar region 01/13/2021  . Abnormal gait 01/13/2021  . PSA elevation 11/01/2018  . Continuous chronic alcoholism ([place]) 12/05/2013  . CAFL (chronic airflow limitation) ([place]) 12/05/2013  . Acid reflux 12/05/2013  . H/O malignant neoplasm of prostate 12/05/2013  . History of repair of hip joint 12/05/2013  . H/O transient cerebral ischemia 12/05/2013  . Chronic obstructive pulmonary disease ([place]) 12/05/2013  . Idiopathic scoliosis and kyphoscoliosis 08/21/2013  . Lumbar canal stenosis 08/21/2013  . 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[place]) 04/16/2013  . Pre-op evaluation 04/06/2013  . Essential hypertension 04/06/2013  . Dyslipidemia 04/06/2013    Silvestre Mesi 03/11/2021, 2:03 PM  [facility] Physical Therapy [address] [place], [place], [address] Phone: [phone]   Fax:  [phone]  Name: EBER FERRUFINO MRN: [id number] Date of Birth: 1940/10/31

## 2021-03-16 ENCOUNTER — Encounter (INDEPENDENT_AMBULATORY_CARE_PROVIDER_SITE_OTHER): Payer: Medicare Other | Admitting: Ophthalmology

## 2021-03-16 ENCOUNTER — Other Ambulatory Visit: Payer: Self-pay

## 2021-03-16 DIAGNOSIS — H43813 Vitreous degeneration, bilateral: Secondary | ICD-10-CM | POA: Diagnosis not present

## 2021-03-16 DIAGNOSIS — H35033 Hypertensive retinopathy, bilateral: Secondary | ICD-10-CM

## 2021-03-16 DIAGNOSIS — H353221 Exudative age-related macular degeneration, left eye, with active choroidal neovascularization: Secondary | ICD-10-CM | POA: Diagnosis not present

## 2021-03-16 DIAGNOSIS — H353112 Nonexudative age-related macular degeneration, right eye, intermediate dry stage: Secondary | ICD-10-CM

## 2021-03-16 DIAGNOSIS — I10 Essential (primary) hypertension: Secondary | ICD-10-CM | POA: Diagnosis not present

## 2021-03-16 DIAGNOSIS — H2513 Age-related nuclear cataract, bilateral: Secondary | ICD-10-CM

## 2021-03-17 ENCOUNTER — Encounter: Payer: Medicare Other | Admitting: Rehabilitative and Restorative Service Providers"

## 2021-03-18 ENCOUNTER — Encounter: Payer: Medicare Other | Admitting: Physical Therapy

## 2021-03-19 ENCOUNTER — Encounter: Payer: Self-pay | Admitting: Specialist

## 2021-03-19 ENCOUNTER — Ambulatory Visit (INDEPENDENT_AMBULATORY_CARE_PROVIDER_SITE_OTHER): Payer: Medicare Other | Admitting: Specialist

## 2021-03-19 ENCOUNTER — Other Ambulatory Visit: Payer: Self-pay

## 2021-03-19 VITALS — BP 149/68 | HR 72 | Ht 67.0 in | Wt 166.0 lb

## 2021-03-19 DIAGNOSIS — M47812 Spondylosis without myelopathy or radiculopathy, cervical region: Secondary | ICD-10-CM | POA: Diagnosis not present

## 2021-03-19 DIAGNOSIS — M47814 Spondylosis without myelopathy or radiculopathy, thoracic region: Secondary | ICD-10-CM | POA: Diagnosis not present

## 2021-03-19 DIAGNOSIS — M4014 Other secondary kyphosis, thoracic region: Secondary | ICD-10-CM

## 2021-03-19 NOTE — Progress Notes (Signed)
Office Visit Note   Patient: Jesse Robbins           Date of Birth: [birth year]           MRN: [id number] Visit Date: 03/19/2021              Requested by: [name], MD [address] [place],  [place] [address] PCP: [name], MD   Assessment & Plan: Visit Diagnoses: No diagnosis found.  Plan: Avoid overhead lifting and overhead use of the arms. Do not lift greater than 5 lbs. Adjust head rest in vehicle to prevent hyperextension if rear ended. Take extra precautions to avoid falling. Fall Prevention and Home Safety Falls cause injuries and can affect all age groups. It is possible to use preventive measures to significantly decrease the likelihood of falls. There are many simple measures which can make your home safer and prevent falls. OUTDOORS  Repair cracks and edges of walkways and driveways.  Remove high doorway thresholds.  Trim shrubbery on the main path into your home.  Have good outside lighting.  Clear walkways of tools, rocks, debris, and clutter.  Check that handrails are not broken and are securely fastened. Both sides of steps should have handrails.  Have leaves, snow, and ice cleared regularly.  Use sand or salt on walkways during winter months.  In the garage, clean up grease or oil spills. BATHROOM  Install night lights.  Install grab bars by the toilet and in the tub and shower.  Use non-skid mats or decals in the tub or shower.  Place a plastic non-slip stool in the shower to sit on, if needed.  Keep floors dry and clean up all water on the floor immediately.  Remove soap buildup in the tub or shower on a regular basis.  Secure bath mats with non-slip, double-sided rug tape.  Remove throw rugs and tripping hazards from the floors. BEDROOMS  Install night lights.  Make sure a bedside light is easy to reach.  Do not use oversized bedding.  Keep a telephone by your bedside.  Have a firm chair with side arms to use for  getting dressed.  Remove throw rugs and tripping hazards from the floor. KITCHEN  Keep handles on pots and pans turned toward the center of the stove. Use back burners when possible.  Clean up spills quickly and allow time for drying.  Avoid walking on wet floors.  Avoid hot utensils and knives.  Position shelves so they are not too high or low.  Place commonly used objects within easy reach.  If necessary, use a sturdy step stool with a grab bar when reaching.  Keep electrical cables out of the way.  Do not use floor polish or wax that makes floors slippery. If you must use wax, use non-skid floor wax.  Remove throw rugs and tripping hazards from the floor. STAIRWAYS  Never leave objects on stairs.  Place handrails on both sides of stairways and use them. Fix any loose handrails. Make sure handrails on both sides of the stairways are as long as the stairs.  Check carpeting to make sure it is firmly attached along stairs. Make repairs to worn or loose carpet promptly.  Avoid placing throw rugs at the top or bottom of stairways, or properly secure the rug with carpet tape to prevent slippage. Get rid of throw rugs, if possible.  Have an electrician put in a light switch at the top and bottom of the stairs. OTHER  FALL PREVENTION TIPS  Wear low-heel or rubber-soled shoes that are supportive and fit well. Wear closed toe shoes.  When using a stepladder, make sure it is fully opened and both spreaders are firmly locked. Do not climb a closed stepladder.  Add color or contrast paint or tape to grab bars and handrails in your home. Place contrasting color strips on first and last steps.  Learn and use mobility aids as needed. Install an electrical emergency response system.  Turn on lights to avoid dark areas. Replace light bulbs that burn out immediately. Get light switches that glow.  Arrange furniture to create clear pathways. Keep furniture in the same place.  Firmly  attach carpet with non-skid or double-sided tape.  Eliminate uneven floor surfaces.  Select a carpet pattern that does not visually hide the edge of steps.  Be aware of all pets. OTHER HOME SAFETY TIPS  Set the water temperature for 120 F (48.8 C).  Keep emergency numbers on or near the telephone.  Keep smoke detectors on every level of the home and near sleeping areas. Document Released: 09/23/2002 Document Revised: 04/03/2012 Document Reviewed: 12/23/2011 [facility] Patient Information 2014 [place].  May use ice or moist heat for pain. Weight loss is of benefit. Handicap license is approved. Dr. Romona Curls secretary/Assistant or the [facility] will call to arrange for epidural steroid injection  Follow-Up Instructions: No follow-ups on file.   Orders:  No orders of the defined types were placed in this encounter.  No orders of the defined types were placed in this encounter.     Procedures: No procedures performed   Clinical Data: No additional findings.   Subjective: Chief Complaint  Patient presents with  . Spine - Follow-up    MRI Review Cspine and Tspine    HPI  Review of Systems  Constitutional: Negative.   HENT: Negative.   Eyes: Negative.   Respiratory: Negative.   Cardiovascular: Negative.   Gastrointestinal: Negative.   Endocrine: Negative.   Genitourinary: Negative.   Musculoskeletal: Negative.   Skin: Negative.   Allergic/Immunologic: Negative.   Neurological: Negative.   Hematological: Negative.   Psychiatric/Behavioral: Negative.      Objective: Vital Signs: BP (!) 149/68 (BP Location: Left Arm, Patient Position: Sitting)   Pulse 72   Ht 5\' 7"  (1.702 m)   Wt 166 lb (75.3 kg)   BMI 26.00 kg/m   Physical Exam Constitutional:      Appearance: He is well-developed.  HENT:     Head: Normocephalic and atraumatic.  Eyes:     Pupils: Pupils are equal, round, and reactive to light.  Pulmonary:     Effort:  Pulmonary effort is normal.     Breath sounds: Normal breath sounds.  Abdominal:     General: Bowel sounds are normal.     Palpations: Abdomen is soft.  Musculoskeletal:        General: Normal range of motion.     Cervical back: Normal range of motion and neck supple.     Lumbar back: Negative right straight leg raise test and negative left straight leg raise test.  Skin:    General: Skin is warm and dry.  Neurological:     Mental Status: He is alert and oriented to person, place, and time.  Psychiatric:        Behavior: Behavior normal.        Thought Content: Thought content normal.        Judgment: Judgment normal.  Back Exam   Tenderness  The patient is experiencing tenderness in the cervical and thoracic.  Muscle Strength  Right Quadriceps:  5/5  Left Quadriceps:  5/5  Right Hamstrings:  5/5  Left Hamstrings:  5/5   Tests  Straight leg raise right: negative Straight leg raise left: negative  Reflexes  Patellar: 1/4 Achilles: 1/4      Specialty Comments:  No specialty comments available.  Imaging: No results found.   PMFS History: Patient Active Problem List   Diagnosis Date Noted  . Fusion of spine, thoracolumbar region 01/13/2021  . Abnormal gait 01/13/2021  . PSA elevation 11/01/2018  . Continuous chronic alcoholism ([place]) 12/05/2013  . CAFL (chronic airflow limitation) ([place]) 12/05/2013  . Acid reflux 12/05/2013  . H/O malignant neoplasm of prostate 12/05/2013  . History of repair of hip joint 12/05/2013  . H/O transient cerebral ischemia 12/05/2013  . Chronic obstructive pulmonary disease ([place]) 12/05/2013  . Idiopathic scoliosis and kyphoscoliosis 08/21/2013  . Lumbar canal stenosis 08/21/2013  . 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[place]) 04/16/2013  . Pre-op evaluation 04/06/2013  . Essential hypertension 04/06/2013  . Dyslipidemia 04/06/2013   Past Medical History:  Diagnosis Date  . Alcohol abuse   . Anemia   . Anxiety   .  Arthritis   . Cervical spinal stenosis   . Chronic gastritis   . COPD (chronic obstructive pulmonary disease) ([place])   . Depression   . Diverticulosis   . Headache(784.0)    migraine  . HTN (hypertension)   . Hyperlipidemia   . Lumbar spinal stenosis   . 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 Stroke [facility])    2010 TIA  . TIA (transient ischemic attack) 06/2009    Family History  Problem Relation Age of Onset  . Colon cancer Mother   . Lung cancer Mother   . Stroke Father   . Prostate cancer Father   . Cancer Maternal Grandfather        lung  . Heart attack Brother   . Hypertension Brother        hyperlipidemia  . Esophageal cancer Neg Hx     Past Surgical History:  Procedure Laterality Date  . APPENDECTOMY  1952  . COLONOSCOPY     dr Earlean Shawl around 2017  . INSERTION PROSTATE RADIATION SEED    . RHINOPLASTY    . SPINE SURGERY    . TONSILLECTOMY  1950  . TOTAL HIP ARTHROPLASTY Right 04/16/2013   Procedure: RIGHT TOTAL HIP ARTHROPLASTY ANTERIOR APPROACH;  Surgeon: Mcarthur Rossetti, MD;  Location: [place];  Service: Orthopedics;  Laterality: Right;   Social History   Occupational History  . Not on file  Tobacco Use  . Smoking status: Former Smoker    Packs/day: 1.00    Years: 25.00    Pack years: 25.00    Quit date: 04/05/1998    Years since quitting: 22.9  . Smokeless tobacco: Never Used  Vaping Use  . Vaping Use: Never used  Substance and Sexual Activity  . Alcohol use: Yes    Alcohol/week: 7.0 standard drinks    Types: 7 Shots of liquor per week    Comment: 6 drinks every day; stopped as of 10/2020  . Drug use: Yes    Types: Marijuana    Comment: less then 1/2 gm a day  . Sexual activity: Not on file

## 2021-03-19 NOTE — Patient Instructions (Signed)
Avoid bending, stooping and avoid lifting weights greater than 10 lbs. Avoid prolong standing and walking. Avoid frequent bending and stooping  No lifting greater than 10 lbs. May use ice or moist heat for pain. Weight loss is of benefit. Handicap license is approved. Dr. Romona Curls secretary/Assistant or the [facility] will call to arrange for epidural steroid injection

## 2021-03-22 ENCOUNTER — Telehealth: Payer: Self-pay | Admitting: Physical Medicine and Rehabilitation

## 2021-03-22 NOTE — Telephone Encounter (Signed)
Called pt and sch 6/16

## 2021-03-22 NOTE — Telephone Encounter (Signed)
Patient called. He missed a call from someone to schedule with Dr. [name]. His call back number is [phone]

## 2021-03-23 ENCOUNTER — Other Ambulatory Visit: Payer: Self-pay

## 2021-03-23 ENCOUNTER — Ambulatory Visit (INDEPENDENT_AMBULATORY_CARE_PROVIDER_SITE_OTHER): Payer: Medicare Other | Admitting: Physical Therapy

## 2021-03-23 DIAGNOSIS — M6281 Muscle weakness (generalized): Secondary | ICD-10-CM

## 2021-03-23 DIAGNOSIS — M545 Low back pain, unspecified: Secondary | ICD-10-CM | POA: Diagnosis not present

## 2021-03-23 DIAGNOSIS — R2689 Other abnormalities of gait and mobility: Secondary | ICD-10-CM

## 2021-03-23 DIAGNOSIS — G8929 Other chronic pain: Secondary | ICD-10-CM

## 2021-03-23 DIAGNOSIS — R293 Abnormal posture: Secondary | ICD-10-CM

## 2021-03-23 NOTE — Therapy (Signed)
[facility] Physical Therapy [address] [place], [place], [address] Phone: [phone]   Fax:  [phone]  Physical Therapy Treatment/Progress note Progress Note reporting period 01/15/21 to 03/23/21  See below for objective and subjective measurements relating to patients progress with PT.   Patient Details  Name: Jesse Robbins MRN: [id number] Date of Birth: 1941/04/06 Referring Provider (PT): [name], MD   Encounter Date: 03/23/2021   PT End of Session - 03/23/21 1512    Visit Number 10    Number of Visits 16    Date for PT Re-Evaluation 03/12/21    Authorization Type Medicare and AARP    Progress Note Due on Visit 10    PT Start Time 1430    PT Stop Time 1510    PT Time Calculation (min) 40 min    Activity Tolerance Patient tolerated treatment well    Behavior During Therapy [facility] for tasks assessed/performed           Past Medical History:  Diagnosis Date  . Alcohol abuse   . Anemia   . Anxiety   . Arthritis   . Cervical spinal stenosis   . Chronic gastritis   . COPD (chronic obstructive pulmonary disease) ([place])   . Depression   . Diverticulosis   . Headache(784.0)    migraine  . HTN (hypertension)   . Hyperlipidemia   . Lumbar spinal stenosis   . 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 Stroke [facility])    2010 TIA  . TIA (transient ischemic attack) 06/2009    Past Surgical History:  Procedure Laterality Date  . APPENDECTOMY  1952  . COLONOSCOPY     dr Earlean Shawl around 2017  . INSERTION PROSTATE RADIATION SEED    . RHINOPLASTY    . SPINE SURGERY    . TONSILLECTOMY  1950  . TOTAL HIP ARTHROPLASTY Right 04/16/2013   Procedure: RIGHT TOTAL HIP ARTHROPLASTY ANTERIOR APPROACH;  Surgeon: Mcarthur Rossetti, MD;  Location: [place];  Service: Orthopedics;  Laterality: Right;    There were no vitals filed for this visit.   Subjective Assessment - 03/23/21 1511    Subjective Pt. relays he was having alot of neck and upper  back pain for about a week but this has improved some. He has new MRI for cervical and thoracic to review in chart    Pertinent History PMH: long stem fusion thoracic down to L5  2015$ due to scoliosis, severe anx and dep, ETOH abuse, COPD,TIA,Rt hip THA 2014    Limitations Lifting;Standing;Walking;House hold activities    How long can you stand comfortably? up to 30 minutes    How long can you walk comfortably? limited community distances    Diagnostic tests see MRI report in chart    Patient Stated Goals improve standing and walking and posture    Pain Onset More than a month ago              [facility] PT Assessment - 03/23/21 0001      Assessment   Medical Diagnosis chronic Rt sided LBP S/P long fusion 2015?, scoliosis, myofascial pain syndrome, posture and gait abnormality.    Referring Provider (PT) [name], MD      Posture/Postural Control   Posture Comments Continued visual presentation of Lt trunk shift in standing, walking posture and evidence of scoliosis      AROM   Cervical Flexion 75%    Cervical Extension 50%    Cervical -  Right Rotation 50%    Cervical - Left Rotation 50%    Lumbar Flexion 75%    Lumbar Extension 50%    Lumbar - Right Rotation 50%    Lumbar - Left Rotation 50%      Strength   Overall Strength Comments 4+ grossly UE/LE strength                         [facility] Adult PT Treatment/Exercise - 03/23/21 0001      Lumbar Exercises: Stretches   Double Knee to Chest Stretch 10 seconds    Double Knee to Chest Stretch Limitations 10 reps with feet on pball    Lower Trunk Rotation 5 reps;10 seconds    Other Lumbar Stretch Exercise seated lumbar flexion stretch pball roll outs 10 sec x10 fwd, then 10 sec X 5 diagonal both ways      Lumbar Exercises: Aerobic   UBE (Upper Arm Bike) 3 min fwd, 3 min retro (prefers leg bike but unavailable)      Lumbar Exercises: Machines for Strengthening   Leg Press 125# 3X15 bilat, then 75# X 20  SL on both     Other Lumbar Machine Exercise row machine 25# 3X10      Lumbar Exercises: Standing   Row Both;20 reps    Theraband Level (Row) Level 4 (Blue)    Shoulder Extension 20 reps    Theraband Level (Shoulder Extension) Level 4 (Blue)    Other Standing Lumbar Exercises tandem balance 30 sec X 2 bilat    Other Standing Lumbar Exercises bilateral hip ext, abd kicks x 20 each with UE support andgreen band                    PT Short Term Goals - 02/02/21 1701      PT SHORT TERM GOAL #1   Title Pt will be I and compliant with HEP    Time 4    Period Weeks    Status On-going      PT SHORT TERM GOAL #2   Title Pt will perform FOTO assessment and then we will write LTG for this    Time 4    Period Weeks    Status Achieved             PT Long Term Goals - 03/23/21 1515      PT LONG TERM GOAL #1   Title Pt will report increased standing/gait tolerance    Time 8    Period Weeks    Status On-going      PT LONG TERM GOAL #2   Title Pt will improve FOTO core to predicted value of 52% functional    Time 8    Period Weeks    Status On-going      PT LONG TERM GOAL #3   Title Pt will improve lumbar-thoracic ROM to [facility] .    Time 8    Period Weeks    Status On-going                 Plan - 03/23/21 1513    Clinical Impression Statement Progress note reflects small overall improvements in ROM, strength, and function. His progress is likely complicated and slowed by long stem fusion, significant scoliosis, and severe foramnial stenosis. He will try spinal injections on 04/01/21.    Personal Factors and Comorbidities Comorbidity 3+    Comorbidities PMH: long stem fusion thoracic down to L5  2015 due to scoliosis, severe anx and dep, ETOH abuse, COPD,TIA,Rt hip THA 2014    Examination-Activity Limitations Bend;Carry;Lift;Stand;Stairs;Squat;Sleep;Locomotion Level    Examination-Participation Restrictions Cleaning;Community Activity;Driving;Shop;Laundry;Yard Work     [employer] Evolving/Moderate complexity    Rehab Potential Good    PT Frequency 2x / week    PT Duration 8 weeks    PT Treatment/Interventions ADLs/Self Care Home Management;Aquatic Therapy;Cryotherapy;Electrical Stimulation;Iontophoresis 4mg /ml Dexamethasone;Moist Heat;Therapeutic activities;Therapeutic exercise;Neuromuscular re-education;Patient/family education;Manual techniques;Passive range of motion;Dry needling;Taping;Joint Manipulations;Spinal Manipulations    PT Next Visit Plan Continue flexion based stretching, postural strengthening/activation and general exercise improvements for goal of walking.    PT Home Exercise Plan Access Code: [email or web address]    Consulted and Agree with Plan of Care Patient           Patient will benefit from skilled therapeutic intervention in order to improve the following deficits and impairments:  Abnormal gait,Decreased activity tolerance,Decreased balance,Decreased endurance,Decreased mobility,Decreased strength,Decreased range of motion,Difficulty walking,Hypomobility,Postural dysfunction,Impaired flexibility,Pain  Visit Diagnosis: Chronic bilateral low back pain without sciatica  Other abnormalities of gait and mobility  Abnormal posture  Muscle weakness (generalized)     Problem List Patient Active Problem List   Diagnosis Date Noted  . Fusion of spine, thoracolumbar region 01/13/2021  . Abnormal gait 01/13/2021  . PSA elevation 11/01/2018  . Continuous chronic alcoholism ([place]) 12/05/2013  . CAFL (chronic airflow limitation) ([place]) 12/05/2013  . Acid reflux 12/05/2013  . H/O malignant neoplasm of prostate 12/05/2013  . History of repair of hip joint 12/05/2013  . H/O transient cerebral ischemia 12/05/2013  . Chronic obstructive pulmonary disease ([place]) 12/05/2013  . Idiopathic scoliosis and kyphoscoliosis 08/21/2013  . Lumbar canal stenosis 08/21/2013  . 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[place]) 04/16/2013  .  Pre-op evaluation 04/06/2013  . Essential hypertension 04/06/2013  . Dyslipidemia 04/06/2013    Silvestre Mesi 03/23/2021, 3:20 PM  [facility] Physical Therapy [address] [place], [place], [address] Phone: [phone]   Fax:  [phone]  Name: Jesse Robbins MRN: [id number] Date of Birth: 07/04/1941

## 2021-03-25 ENCOUNTER — Other Ambulatory Visit: Payer: Self-pay

## 2021-03-25 ENCOUNTER — Ambulatory Visit (INDEPENDENT_AMBULATORY_CARE_PROVIDER_SITE_OTHER): Payer: Medicare Other | Admitting: Physical Therapy

## 2021-03-25 DIAGNOSIS — R2689 Other abnormalities of gait and mobility: Secondary | ICD-10-CM

## 2021-03-25 DIAGNOSIS — M6281 Muscle weakness (generalized): Secondary | ICD-10-CM | POA: Diagnosis not present

## 2021-03-25 DIAGNOSIS — G8929 Other chronic pain: Secondary | ICD-10-CM

## 2021-03-25 DIAGNOSIS — M545 Low back pain, unspecified: Secondary | ICD-10-CM

## 2021-03-25 DIAGNOSIS — R293 Abnormal posture: Secondary | ICD-10-CM | POA: Diagnosis not present

## 2021-03-25 NOTE — Therapy (Addendum)
[facility] Physical Therapy [address] [place], [place], [address] Phone: [phone]   Fax:  [phone]  Physical Therapy Treatment/Discharge addendum PHYSICAL THERAPY DISCHARGE SUMMARY  Visits from Start of Care: 11  Current functional level related to goals / functional outcomes: See below   Remaining deficits: See below   Education / Equipment: HEP Plan: Patient goals were not all met. Patient is being discharged due to not returning since last visit.      Patient Details  Name: Jesse Robbins MRN: [id number] Date of Birth: 08/15/1941 Referring Provider (PT): [name], MD   Encounter Date: 03/25/2021   PT End of Session - 03/25/21 1555     Visit Number 11    Number of Visits 16    Date for PT Re-Evaluation    Authorization Type Medicare and AARP    Progress Note Due on Visit 10    PT Start Time 6812    PT Stop Time 7517    PT Time Calculation (min) 41 min    Activity Tolerance Patient tolerated treatment well    Behavior During Therapy WFL for tasks assessed/performed             Past Medical History:  Diagnosis Date   Alcohol abuse    Anemia    Anxiety    Arthritis    Cervical spinal stenosis    Chronic gastritis    COPD (chronic obstructive pulmonary disease) ([place])    Depression    Diverticulosis    Headache(784.0)    migraine   HTN (hypertension)    Hyperlipidemia    Lumbar spinal stenosis    Prostate cancer ([place]) 2004   seed implant and beam radiation   Stroke ([place])    2010 TIA   TIA (transient ischemic attack) 06/2009    Past Surgical History:  Procedure Laterality Date   APPENDECTOMY  1952   COLONOSCOPY     dr Earlean Shawl around 2017   [place] Right 04/16/2013   Procedure: RIGHT TOTAL HIP ARTHROPLASTY ANTERIOR APPROACH;  Surgeon: Mcarthur Rossetti, MD;  Location: [facility];  Service: Orthopedics;   Laterality: Right;    There were no vitals filed for this visit.   Subjective Assessment - 03/25/21 1535     Subjective Pt. relays some thoracic pain after UBE last visit so requests not to do that today. His pain as resolved since then however.    Pertinent History PMH: long stem fusion thoracic down to L5  2015$ due to scoliosis, severe anx and dep, ETOH abuse, COPD,TIA,Rt hip THA 2014    Limitations Lifting;Standing;Walking;House hold activities    How long can you stand comfortably? up to 30 minutes    How long can you walk comfortably? limited community distances    Diagnostic tests see MRI report in chart    Patient Stated Goals improve standing and walking and posture    Pain Onset More than a month ago                               [facility] Adult PT Treatment/Exercise - 03/25/21 0001       Lumbar Exercises: Stretches   Other Lumbar Stretch Exercise standing L stretch for lumbar flexion and thoracic extension 10 sec x10      Lumbar Exercises: Aerobic  Nustep L5 X10 min      Lumbar Exercises: Machines for Strengthening   Leg Press 125# 3X15 bilat, then 75# X 20  SL on both    Other Lumbar Machine Exercise row machine 25# 3X10      Lumbar Exercises: Standing   Scapular Retraction Limitations with bilateral shoulder ER green 2X15 ea    Row Both;15 reps    Theraband Level (Row) Level 4 (Blue)    Row Limitations unilat alternating    Other Standing Lumbar Exercises tandem walk at counter top 3 round trips with close supervision    Other Standing Lumbar Exercises bilateral hip ext, abd kicks x 15 each with UE support and green band around ankles                      PT Short Term Goals - 02/02/21 1701       PT SHORT TERM GOAL #1   Title Pt will be I and compliant with HEP    Time 4    Period Weeks    Status On-going      PT SHORT TERM GOAL #2   Title Pt will perform FOTO assessment and then we will write LTG for this    Time 4     Period Weeks    Status Achieved               PT Long Term Goals - 03/23/21 1515       PT LONG TERM GOAL #1   Title Pt will report increased standing/gait tolerance    Time 8    Period Weeks    Status On-going      PT LONG TERM GOAL #2   Title Pt will improve FOTO core to predicted value of 52% functional    Time 8    Period Weeks    Status On-going      PT LONG TERM GOAL #3   Title Pt will improve lumbar-thoracic ROM to [facility] .    Time 8    Period Weeks    Status On-going                   Plan - 03/25/21 1556     Clinical Impression Statement He will trial injections next week, this was the last PT visit he currently had scheduled so we will see how he responds to injections then was encouraged to set up more PT if he feels like he needs to as he could benefit from more posture, endurance, and strength training to improve overall function.    Personal Factors and Comorbidities Comorbidity 3+    Comorbidities PMH: long stem fusion thoracic down to L5  2015 due to scoliosis, severe anx and dep, ETOH abuse, COPD,TIA,Rt hip THA 2014    Examination-Activity Limitations Bend;Carry;Lift;Stand;Stairs;Squat;Sleep;Locomotion Level    Examination-Participation Restrictions Cleaning;Community Activity;Driving;Shop;Laundry;Yard Work    [employer] Evolving/Moderate complexity    Rehab Potential Good    PT Frequency 2x / week    PT Duration 8 weeks    PT Treatment/Interventions ADLs/Self Care Home Management;Aquatic Therapy;Cryotherapy;Electrical Stimulation;Iontophoresis 4mg /ml Dexamethasone;Moist Heat;Therapeutic activities;Therapeutic exercise;Neuromuscular re-education;Patient/family education;Manual techniques;Passive range of motion;Dry needling;Taping;Joint Manipulations;Spinal Manipulations    PT Next Visit Plan Continue flexion based stretching, postural strengthening/activation and general exercise improvements for goal of walking.    PT Home  Exercise Plan Access Code: [email or web address]    Consulted and Agree with Plan of Care Patient  Patient will benefit from skilled therapeutic intervention in order to improve the following deficits and impairments:  Abnormal gait, Decreased activity tolerance, Decreased balance, Decreased endurance, Decreased mobility, Decreased strength, Decreased range of motion, Difficulty walking, Hypomobility, Postural dysfunction, Impaired flexibility, Pain  Visit Diagnosis: Chronic bilateral low back pain without sciatica  Other abnormalities of gait and mobility  Abnormal posture  Muscle weakness (generalized)     Problem List Patient Active Problem List   Diagnosis Date Noted   Fusion of spine, thoracolumbar region 01/13/2021   Abnormal gait 01/13/2021   PSA elevation 11/01/2018   Continuous chronic alcoholism ([place]) 12/05/2013   CAFL (chronic airflow limitation) ([facility]) 12/05/2013   Acid reflux 12/05/2013   H/O malignant neoplasm of prostate 12/05/2013   History of repair of hip joint 12/05/2013   H/O transient cerebral ischemia 12/05/2013   Chronic obstructive pulmonary disease ([place]) 12/05/2013   Idiopathic scoliosis and kyphoscoliosis 08/21/2013   Lumbar canal stenosis 08/21/2013   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[place]) 04/16/2013   Pre-op evaluation 04/06/2013   Essential hypertension 04/06/2013   Dyslipidemia 04/06/2013    Silvestre Mesi 03/25/2021, 3:58 PM  [facility] Physical Therapy [address] [place], [place], [address] Phone: [phone]   Fax:  [phone]  Name: FLETCHER OSTERMILLER MRN: [id number] Date of Birth: [birth year]

## 2021-03-29 ENCOUNTER — Other Ambulatory Visit: Payer: Self-pay

## 2021-03-29 ENCOUNTER — Encounter (INDEPENDENT_AMBULATORY_CARE_PROVIDER_SITE_OTHER): Payer: Medicare Other | Admitting: Ophthalmology

## 2021-03-29 DIAGNOSIS — I10 Essential (primary) hypertension: Secondary | ICD-10-CM

## 2021-03-29 DIAGNOSIS — H35033 Hypertensive retinopathy, bilateral: Secondary | ICD-10-CM | POA: Diagnosis not present

## 2021-03-29 DIAGNOSIS — H353221 Exudative age-related macular degeneration, left eye, with active choroidal neovascularization: Secondary | ICD-10-CM | POA: Diagnosis not present

## 2021-03-29 DIAGNOSIS — H43813 Vitreous degeneration, bilateral: Secondary | ICD-10-CM

## 2021-03-29 DIAGNOSIS — H353112 Nonexudative age-related macular degeneration, right eye, intermediate dry stage: Secondary | ICD-10-CM | POA: Diagnosis not present

## 2021-04-01 ENCOUNTER — Other Ambulatory Visit: Payer: Self-pay

## 2021-04-01 ENCOUNTER — Ambulatory Visit: Payer: Self-pay

## 2021-04-01 ENCOUNTER — Telehealth: Payer: Self-pay | Admitting: Specialist

## 2021-04-01 ENCOUNTER — Ambulatory Visit (INDEPENDENT_AMBULATORY_CARE_PROVIDER_SITE_OTHER): Payer: Medicare Other | Admitting: Physical Medicine and Rehabilitation

## 2021-04-01 ENCOUNTER — Encounter: Payer: Self-pay | Admitting: Physical Medicine and Rehabilitation

## 2021-04-01 DIAGNOSIS — M5414 Radiculopathy, thoracic region: Secondary | ICD-10-CM

## 2021-04-01 DIAGNOSIS — M4325 Fusion of spine, thoracolumbar region: Secondary | ICD-10-CM

## 2021-04-01 MED ORDER — BETAMETHASONE SOD PHOS & ACET 6 (3-3) MG/ML IJ SUSP
12.0000 mg | Freq: Once | INTRAMUSCULAR | Status: AC
  Administered 2021-04-01: 12 mg

## 2021-04-01 NOTE — Telephone Encounter (Signed)
This is fine, we will know how long and if it worked for him

## 2021-04-01 NOTE — Progress Notes (Signed)
Pt state pain between both shoulder blades. Pt state while at PT the excise made the pain worse. Pt state he doesn't know what cause the pain. Pt state he takes pain meds and uses heating to help ease his pain.  Numeric Pain Rating Scale and Functional Assessment Average Pain 2   In the last MONTH (on 0-10 scale) has pain interfered with the following?  1. General activity like being  able to carry out your everyday physical activities such as walking, climbing stairs, carrying groceries, or moving a chair?  Rating(7)   +Driver, -BT, -Dye Allergies.

## 2021-04-01 NOTE — Patient Instructions (Signed)

## 2021-04-01 NOTE — Telephone Encounter (Signed)
Pt just had an appt with Dr.Newton for injection. He is suppose to follow up with Nitka in 2 weeks, but the soonest appt was 4 weeks out. Just wanted to make you aware.

## 2021-04-01 NOTE — Progress Notes (Signed)
ANSEN SAYEGH - 80 y.o. male MRN [id number]  Date of birth: 11-Oct-1941  Office Visit Note: Visit Date: 04/01/2021 PCP: [name], MD Referred by: [name], MD  Subjective: Chief Complaint  Patient presents with   Right Shoulder - Pain   Left Shoulder - Pain   HPI:  Jesse Robbins is a 80 y.o. male who comes in today at the request of Dr. Basil Dess for planned Right T9-10 Thoracic Interlaminar epidural steroid injection with fluoroscopic guidance.  The patient has failed conservative care including home exercise, medications, time and activity modification.  This injection will be diagnostic and hopefully therapeutic.  Please see requesting physician notes for further details and justification.   ROS Otherwise per HPI.  Assessment & Plan: Visit Diagnoses:    ICD-10-CM   1. Thoracic radiculopathy  M54.14 XR C-ARM NO REPORT    Epidural Steroid injection    betamethasone acetate-betamethasone sodium phosphate (CELESTONE) injection 12 mg    2. Fusion of spine, thoracolumbar region  M43.25 XR C-ARM NO REPORT    Epidural Steroid injection    betamethasone acetate-betamethasone sodium phosphate (CELESTONE) injection 12 mg      Plan: No additional findings.   Meds & Orders:  Meds ordered this encounter  Medications   betamethasone acetate-betamethasone sodium phosphate (CELESTONE) injection 12 mg    Orders Placed This Encounter  Procedures   XR C-ARM NO REPORT   Epidural Steroid injection    Follow-up: Return for Basil Dess, MD as scheduled.   Procedures: No procedures performed  Thoracic epidural Steroid Injection - Interlaminar Approach with Fluoroscopic Guidance  Patient: Jesse Robbins      Date of Birth: 1941/10/09 MRN: [id number] PCP: [name], MD      Visit Date: 04/01/2021   Universal Protocol:     Consent Given By: the patient  Position: PRONE  Additional Comments: Vital signs were monitored before and after the procedure. Patient was  prepped and draped in the usual sterile fashion. The correct patient, procedure, and site was verified.   Injection Procedure Details:   Procedure diagnoses:  1. Thoracic radiculopathy   2. Fusion of spine, thoracolumbar region      Meds Administered:  Meds ordered this encounter  Medications   betamethasone acetate-betamethasone sodium phosphate (CELESTONE) injection 12 mg     Laterality: Right  Location/Site:  T9-10  Needle: 3.5 in., 20 ga. Tuohy  Needle Placement: Paramedian epidural  Findings:   -Comments: Excellent flow of contrast along the nerve and into the epidural space.  Procedure Details: The fluoroscope was aligned to square off the endplates at the level noted above.  The target area located is the inferior or caudal portion of the vertebral body which is essentially the superior lamina of the level indicated and the overlying structure was infiltrated with 2 to 3 mL of 1% lidocaine without epinephrine and a 22-gauge spinal needle was introduced down to the lamina until contact with bone.  This needle was utilized as a locating needle.  The fluoroscope was subsequently tilted in a caudal direction to achieve the most open interlaminar spacing noted.  Using a paramedian approach from the side mentioned above, the region overlying the interlaminar space was localized under fluoroscopic visualization and the soft tissues overlying this structure were infiltrated with 4 ml. of 1% Lidocaine without Epinephrine. The Tuohy needle was inserted into the epidural space using a paramedian approach and biplanar fluoroscopy.   The epidural space was localized using loss of resistance  along with lateral or counter oblique and bi-planar fluoroscopic views.  After negative aspirate for air, blood, and CSF, a 2 ml. volume of Isovue-250 was injected into the epidural space and the flow of contrast was observed. Radiographs were obtained for documentation purposes.    The injectate was  administered into the level noted above.   Additional Comments:  The patient tolerated the procedure well Dressing: 2 x 2 sterile gauze and Band-Aid    Post-procedure details: Patient was observed during the procedure. Post-procedure instructions were reviewed.  Patient left the clinic in stable condition.    Clinical History: MRI THORACIC SPINE WITHOUT CONTRAST     COMPARISON:  Cervical spine MRI from the same day reported separately. Lumbar MRI 12/20/2020. Thoracic spine radiographs 10/30/2020.   FINDINGS: Limited cervical spine imaging:  Reported separately.   Thoracic spine segmentation: Appears to be normal. In this numbering system is concordant with the March lumbar MRI.   Alignment: Preserved thoracic kyphosis with mild dextroconvex thoracic scoliosis, stable from January radiographs. Subtle spondylolisthesis in the upper thoracic levels, anterolisthesis T1-T2, T3-T4 and T4-T5 measuring about 2 mm. There is similar subtle anterolisthesis of T8 on T9.   Vertebrae: Confluent degenerative appearing endplate marrow edema at T1-T2 eccentric to the left (series 4, image 15). See additional details of that level below.   Moderate marrow edema also at the T9 anterior inferior endplate.   Mild hardware susceptibility artifact beginning at T10 and continuing through the upper lumbar spine. No other marrow edema or acute osseous abnormality. A small T8 inferior endplate Schmorl's node is chronic.   Cord: Lower thoracic spinal cord detail from T10 inferiorly is limited by battle susceptibility artifact. But no spinal cord signal abnormality is identified. In the thoracic canal is capacious at most levels.   Paraspinal and other soft tissues: Negative.   Disc levels:   T1-T2: Severe disc space loss with probable vacuum disc. Circumferential disc osteophyte complex with foraminal involvement. Mild to moderate facet and ligament flavum hypertrophy. But no spinal  stenosis. Moderate left and severe right T1 foraminal stenosis.   T2-T3: Disc space loss with circumferential disc osteophyte complex, bulky at the foramina. Mild to moderate facet and ligament flavum hypertrophy. No spinal stenosis. Severe bilateral T2 foraminal stenosis.   T3-T4: Mild disc space loss and disc bulging. Moderate facet and ligament flavum hypertrophy. No spinal stenosis. Moderate to severe bilateral T3 foraminal stenosis.   T4-T5: Relatively normal disc. Moderate facet and ligament flavum hypertrophy. Moderate to severe T4 foraminal stenosis greater on the left.   T5-T6: Mild disc space loss with circumferential disc bulge. Moderate facet and ligament flavum hypertrophy. Moderate to severe T5 foraminal stenosis.   T6-T7: Mild circumferential disc bulge with superimposed small right paracentral disc protrusion (series 8, image 19). Mild facet and ligament flavum hypertrophy here. No stenosis.   T7-T8: Circumferential disc bulge with broad-based left paracentral component of disc on series 8, image 23. Moderate facet and ligament flavum hypertrophy. No spinal stenosis. Moderate left greater than right T7 foraminal stenosis.   T8-T9: Circumferential disc bulge. Moderate facet and ligament flavum hypertrophy. Moderate to severe left and mild to moderate right T8 foraminal stenosis.   T9-T10: Possible anterior vacuum disc. Circumferential disc osteophyte complex. Moderate facet and ligament flavum hypertrophy. But no convincing stenosis at this level.   T10-T11: Prior posterior fusion. Largely negative disc and no stenosis.   T11-T12: Prior posterior fusion. Evidence of solid interbody arthrodesis. No stenosis.   T12-L1: Prior fusion with  evidence of solid arthrodesis. This level was better demonstrated in March, with no stenosis evident.   IMPRESSION: 1. Prior posterior fusion from T10 through the upper lumbar spine, with evidence of solid arthrodesis and  no lower thoracic stenosis. 2. Elsewhere the dominant thoracic degenerative finding is facet arthropathy and ligament flavum hypertrophy, with multilevel mild anterolisthesis. Occasional advanced disc degeneration (T1-T2 and the T9-T10 adjacent segment). But no thoracic spinal stenosis. No thoracic spinal cord abnormality. 3. There is moderate or severe thoracic neural foraminal stenosis at the bilateral T1, T2, T3, T4, T5, and left T8 nerve levels.     Electronically Signed   By: Genevie Ann M.D.   On: 03/09/2021 05:40     Objective:  VS:  HT:    WT:   BMI:     BP:   HR: bpm  TEMP: ( )  RESP:  Physical Exam   Imaging: No results found.

## 2021-04-11 NOTE — Procedures (Signed)
Thoracic epidural Steroid Injection - Interlaminar Approach with Fluoroscopic Guidance  Patient: Jesse Robbins      Date of Birth: Jun 19, 1941 MRN: [id number] PCP: [name], MD      Visit Date: 04/01/2021   Universal Protocol:     Consent Given By: the patient  Position: PRONE  Additional Comments: Vital signs were monitored before and after the procedure. Patient was prepped and draped in the usual sterile fashion. The correct patient, procedure, and site was verified.   Injection Procedure Details:   Procedure diagnoses:  1. Thoracic radiculopathy   2. Fusion of spine, thoracolumbar region      Meds Administered:  Meds ordered this encounter  Medications   betamethasone acetate-betamethasone sodium phosphate (CELESTONE) injection 12 mg     Laterality: Right  Location/Site:  T9-10  Needle: 3.5 in., 20 ga. Tuohy  Needle Placement: Paramedian epidural  Findings:   -Comments: Excellent flow of contrast along the nerve and into the epidural space.  Procedure Details: The fluoroscope was aligned to square off the endplates at the level noted above.  The target area located is the inferior or caudal portion of the vertebral body which is essentially the superior lamina of the level indicated and the overlying structure was infiltrated with 2 to 3 mL of 1% lidocaine without epinephrine and a 22-gauge spinal needle was introduced down to the lamina until contact with bone.  This needle was utilized as a locating needle.  The fluoroscope was subsequently tilted in a caudal direction to achieve the most open interlaminar spacing noted.  Using a paramedian approach from the side mentioned above, the region overlying the interlaminar space was localized under fluoroscopic visualization and the soft tissues overlying this structure were infiltrated with 4 ml. of 1% Lidocaine without Epinephrine. The Tuohy needle was inserted into the epidural space using a paramedian approach and  biplanar fluoroscopy.   The epidural space was localized using loss of resistance along with lateral or counter oblique and bi-planar fluoroscopic views.  After negative aspirate for air, blood, and CSF, a 2 ml. volume of Isovue-250 was injected into the epidural space and the flow of contrast was observed. Radiographs were obtained for documentation purposes.    The injectate was administered into the level noted above.   Additional Comments:  The patient tolerated the procedure well Dressing: 2 x 2 sterile gauze and Band-Aid    Post-procedure details: Patient was observed during the procedure. Post-procedure instructions were reviewed.  Patient left the clinic in stable condition.

## 2021-04-26 ENCOUNTER — Other Ambulatory Visit: Payer: Self-pay

## 2021-04-26 ENCOUNTER — Encounter (INDEPENDENT_AMBULATORY_CARE_PROVIDER_SITE_OTHER): Payer: Medicare Other | Admitting: Ophthalmology

## 2021-04-26 DIAGNOSIS — H353112 Nonexudative age-related macular degeneration, right eye, intermediate dry stage: Secondary | ICD-10-CM

## 2021-04-26 DIAGNOSIS — H353221 Exudative age-related macular degeneration, left eye, with active choroidal neovascularization: Secondary | ICD-10-CM

## 2021-04-26 DIAGNOSIS — I10 Essential (primary) hypertension: Secondary | ICD-10-CM | POA: Diagnosis not present

## 2021-04-26 DIAGNOSIS — H35033 Hypertensive retinopathy, bilateral: Secondary | ICD-10-CM | POA: Diagnosis not present

## 2021-04-26 DIAGNOSIS — H43813 Vitreous degeneration, bilateral: Secondary | ICD-10-CM

## 2021-04-28 ENCOUNTER — Ambulatory Visit (INDEPENDENT_AMBULATORY_CARE_PROVIDER_SITE_OTHER): Payer: Medicare Other | Admitting: Specialist

## 2021-04-28 ENCOUNTER — Encounter: Payer: Self-pay | Admitting: Specialist

## 2021-04-28 VITALS — BP 166/84 | HR 75 | Ht 67.0 in | Wt 166.0 lb

## 2021-04-28 DIAGNOSIS — M47814 Spondylosis without myelopathy or radiculopathy, thoracic region: Secondary | ICD-10-CM

## 2021-04-28 DIAGNOSIS — M4014 Other secondary kyphosis, thoracic region: Secondary | ICD-10-CM

## 2021-04-28 DIAGNOSIS — M47812 Spondylosis without myelopathy or radiculopathy, cervical region: Secondary | ICD-10-CM | POA: Diagnosis not present

## 2021-04-28 DIAGNOSIS — R2689 Other abnormalities of gait and mobility: Secondary | ICD-10-CM | POA: Diagnosis not present

## 2021-04-28 DIAGNOSIS — M4153 Other secondary scoliosis, cervicothoracic region: Secondary | ICD-10-CM

## 2021-04-28 NOTE — Progress Notes (Signed)
Office Visit Note   Patient: Jesse Robbins           Date of Birth: [birth year]           MRN: [id number] Visit Date: 04/28/2021              Requested by: [name], MD [address] [place],  [place] [address] PCP: [name], MD   Assessment & Plan: Visit Diagnoses:  1. Spondylosis without myelopathy or radiculopathy, cervical region   2. Other secondary kyphosis, thoracic region   3. Balance disorder   4. Spondylosis, thoracic   5. Other secondary scoliosis, cervicothoracic region     Plan: Avoid bending, stooping and avoid lifting weights greater than 10 lbs. Avoid prolong standing and walking. Avoid frequent bending and stooping  No lifting greater than 10 lbs. May use ice or moist heat for pain. Weight loss is of benefit. Handicap license is approved. Dr. Romona Curls secretary/Assistant will call to arrange for epidural steroid injection    Follow-Up Instructions: No follow-ups on file.   Orders:  Orders Placed This Encounter  Procedures   Ambulatory referral to Physical Medicine Rehab   No orders of the defined types were placed in this encounter.     Procedures: No procedures performed   Clinical Data: No additional findings.   Subjective: Chief Complaint  Patient presents with   Middle Back - Pain    HPI  Review of Systems   Objective: Vital Signs: BP (!) 166/84   Pulse 75   Ht 5\' 7"  (1.702 m)   Wt 166 lb (75.3 kg)   BMI 26.00 kg/m   Physical Exam  Ortho Exam  Specialty Comments:  No specialty comments available.  Imaging: No results found.   PMFS History: Patient Active Problem List   Diagnosis Date Noted   Fusion of spine, thoracolumbar region 01/13/2021   Abnormal gait 01/13/2021   PSA elevation 11/01/2018   Continuous chronic alcoholism ([place]) 12/05/2013   CAFL (chronic airflow limitation) ([facility]) 12/05/2013   Acid reflux 12/05/2013   H/O malignant neoplasm of prostate 12/05/2013   History of repair of hip  joint 12/05/2013   H/O transient cerebral ischemia 12/05/2013   Chronic obstructive pulmonary disease ([place]) 12/05/2013   Idiopathic scoliosis and kyphoscoliosis 08/21/2013   Lumbar canal stenosis 08/21/2013   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[place]) 04/16/2013   Pre-op evaluation 04/06/2013   Essential hypertension 04/06/2013   Dyslipidemia 04/06/2013   Past Medical History:  Diagnosis Date   Alcohol abuse    Anemia    Anxiety    Arthritis    Cervical spinal stenosis    Chronic gastritis    COPD (chronic obstructive pulmonary disease) ([facility])    Depression    Diverticulosis    Headache(784.0)    migraine   HTN (hypertension)    Hyperlipidemia    Lumbar spinal stenosis    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   seed implant and beam radiation   Stroke ([place])    2010 TIA   TIA (transient ischemic attack) 06/2009    Family History  Problem Relation Age of Onset   Colon cancer Mother    Lung cancer Mother    Stroke Father    Prostate cancer Father    Cancer Maternal Grandfather        lung   Heart attack Brother    Hypertension Brother        hyperlipidemia   Esophageal cancer Neg Hx  Past Surgical History:  Procedure Laterality Date   APPENDECTOMY  1952   COLONOSCOPY     dr Earlean Shawl around 2017   [place] Right 04/16/2013   Procedure: RIGHT TOTAL HIP ARTHROPLASTY ANTERIOR APPROACH;  Surgeon: Mcarthur Rossetti, MD;  Location: [facility];  Service: Orthopedics;  Laterality: Right;   Social History   Occupational History   Not on file  Tobacco Use   Smoking status: Former    Packs/day: 1.00    Years: 25.00    Pack years: 25.00    Types: Cigarettes    Quit date: 04/05/1998    Years since quitting: 23.0   Smokeless tobacco: Never  Vaping Use   Vaping Use: Never used  Substance and Sexual Activity   Alcohol use: Yes    Alcohol/week: 7.0 standard drinks     Types: 7 Shots of liquor per week    Comment: 6 drinks every day; stopped as of 10/2020   Drug use: Yes    Types: Marijuana    Comment: less then 1/2 gm a day   Sexual activity: Not on file

## 2021-05-10 ENCOUNTER — Encounter: Payer: Self-pay | Admitting: Physical Medicine and Rehabilitation

## 2021-05-10 ENCOUNTER — Other Ambulatory Visit: Payer: Self-pay

## 2021-05-10 ENCOUNTER — Ambulatory Visit: Payer: Self-pay

## 2021-05-10 ENCOUNTER — Ambulatory Visit (INDEPENDENT_AMBULATORY_CARE_PROVIDER_SITE_OTHER): Payer: Medicare Other | Admitting: Physical Medicine and Rehabilitation

## 2021-05-10 VITALS — BP 95/63 | HR 80

## 2021-05-10 DIAGNOSIS — M5414 Radiculopathy, thoracic region: Secondary | ICD-10-CM

## 2021-05-10 MED ORDER — BETAMETHASONE SOD PHOS & ACET 6 (3-3) MG/ML IJ SUSP
12.0000 mg | Freq: Once | INTRAMUSCULAR | Status: AC
  Administered 2021-05-10: 12 mg

## 2021-05-10 NOTE — Patient Instructions (Signed)

## 2021-05-10 NOTE — Progress Notes (Signed)
Pt state middle back pain. Pt state walking makes the pain worse. Pt state he takes pain meds to help ease his pain. Pt has hx of inj on 04/01/21 pt state it helped a little. P  Numeric Pain Rating Scale and Functional Assessment Average Pain 3   In the last MONTH (on 0-10 scale) has pain interfered with the following?  1. General activity like being  able to carry out your everyday physical activities such as walking, climbing stairs, carrying groceries, or moving a chair?  Rating(10)   +Driver, -BT, -Dye Allergies.

## 2021-05-12 NOTE — Progress Notes (Signed)
MCHENRY LABA - 80 y.o. male MRN [id number]  Date of birth: 01-30-1941  Office Visit Note: Visit Date: 05/10/2021 PCP: [name], MD Referred by: [name], MD  Subjective: Chief Complaint  Patient presents with   Middle Back - Pain   HPI:  Jesse Robbins is a 80 y.o. male who comes in today for planned repeat Right T9-10  Thoracic Interlaminar epidural steroid injection with fluoroscopic guidance.  The patient has failed conservative care including home exercise, medications, time and activity modification.  This injection will be diagnostic and hopefully therapeutic.  Please see requesting physician notes for further details and justification. Patient received more than 50% pain relief from prior injection.  He reports relief was very short-lived.  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  Depending on relief would look at transforaminal approach.  If not getting significant relief at that point he will follow-up with Dr. Louanne Skye for further management.  Referring: Dr. Basil Dess   ROS Otherwise per HPI.  Assessment & Plan: Visit Diagnoses:    ICD-10-CM   1. Thoracic radiculopathy  M54.14 XR C-ARM NO REPORT    Epidural Steroid injection    betamethasone acetate-betamethasone sodium phosphate (CELESTONE) injection 12 mg      Plan: No additional findings.   Meds & Orders:  Meds ordered this encounter  Medications   betamethasone acetate-betamethasone sodium phosphate (CELESTONE) injection 12 mg    Orders Placed This Encounter  Procedures   XR C-ARM NO REPORT   Epidural Steroid injection    Follow-up: Return for visit to requesting physician as needed, consider transforaminal.   Procedures: No procedures performed  Thoracic epidural Steroid Injection - Interlaminar Approach with Fluoroscopic Guidance  Patient: Jesse Robbins      Date of Birth: [birth year] MRN: [id number] PCP: [name], MD      Visit  Date: 05/10/2021   Universal Protocol:     Consent Given By: the patient  Position: PRONE  Additional Comments: Vital signs were monitored before and after the procedure. Patient was prepped and draped in the usual sterile fashion. The correct patient, procedure, and site was verified.   Injection Procedure Details:   Procedure diagnoses:  1. Thoracic radiculopathy      Meds Administered:  Meds ordered this encounter  Medications   betamethasone acetate-betamethasone sodium phosphate (CELESTONE) injection 12 mg     Laterality: Right  Location/Site:  T9-10  Needle: 3.5 in., 20 ga. Tuohy  Needle Placement: Paramedian epidural  Findings:   -Comments:  Very difficult time entering the epidural space do to kyphotic curvature and hardware below the injection site and visibility.  Did get loss-of-resistance and some flow of contrast.  Procedure Details: The fluoroscope was aligned to square off the endplates at the level noted above.  The target area located is the inferior or caudal portion of the vertebral body which is essentially the superior lamina of the level indicated and the overlying structure was infiltrated with 2 to 3 mL of 1% lidocaine without epinephrine and a 22-gauge spinal needle was introduced down to the lamina until contact with bone.  This needle was utilized as a locating needle.  The fluoroscope was subsequently tilted in a caudal direction to achieve the most open interlaminar spacing noted.  Using a paramedian approach from the side mentioned above, the region overlying the interlaminar space was localized under fluoroscopic visualization and the soft tissues overlying this structure were infiltrated with 4 ml.  of 1% Lidocaine without Epinephrine. The Tuohy needle was inserted into the epidural space using a paramedian approach and biplanar fluoroscopy.   The epidural space was localized using loss of resistance along with lateral or counter oblique and  bi-planar fluoroscopic views.  After negative aspirate for air, blood, and CSF, a 2 ml. volume of Isovue-250 was injected into the epidural space and the flow of contrast was observed. Radiographs were obtained for documentation purposes.    The injectate was administered into the level noted above.   Additional Comments:  The patient tolerated the procedure well Dressing: 2 x 2 sterile gauze and Band-Aid    Post-procedure details: Patient was observed during the procedure. Post-procedure instructions were reviewed.  Patient left the clinic in stable condition.    Clinical History: MRI THORACIC SPINE WITHOUT CONTRAST     COMPARISON:  Cervical spine MRI from the same day reported separately. Lumbar MRI 12/20/2020. Thoracic spine radiographs 10/30/2020.   FINDINGS: Limited cervical spine imaging:  Reported separately.   Thoracic spine segmentation: Appears to be normal. In this numbering system is concordant with the March lumbar MRI.   Alignment: Preserved thoracic kyphosis with mild dextroconvex thoracic scoliosis, stable from January radiographs. Subtle spondylolisthesis in the upper thoracic levels, anterolisthesis T1-T2, T3-T4 and T4-T5 measuring about 2 mm. There is similar subtle anterolisthesis of T8 on T9.   Vertebrae: Confluent degenerative appearing endplate marrow edema at T1-T2 eccentric to the left (series 4, image 15). See additional details of that level below.   Moderate marrow edema also at the T9 anterior inferior endplate.   Mild hardware susceptibility artifact beginning at T10 and continuing through the upper lumbar spine. No other marrow edema or acute osseous abnormality. A small T8 inferior endplate Schmorl's node is chronic.   Cord: Lower thoracic spinal cord detail from T10 inferiorly is limited by battle susceptibility artifact. But no spinal cord signal abnormality is identified. In the thoracic canal is capacious at most levels.    Paraspinal and other soft tissues: Negative.   Disc levels:   T1-T2: Severe disc space loss with probable vacuum disc. Circumferential disc osteophyte complex with foraminal involvement. Mild to moderate facet and ligament flavum hypertrophy. But no spinal stenosis. Moderate left and severe right T1 foraminal stenosis.   T2-T3: Disc space loss with circumferential disc osteophyte complex, bulky at the foramina. Mild to moderate facet and ligament flavum hypertrophy. No spinal stenosis. Severe bilateral T2 foraminal stenosis.   T3-T4: Mild disc space loss and disc bulging. Moderate facet and ligament flavum hypertrophy. No spinal stenosis. Moderate to severe bilateral T3 foraminal stenosis.   T4-T5: Relatively normal disc. Moderate facet and ligament flavum hypertrophy. Moderate to severe T4 foraminal stenosis greater on the left.   T5-T6: Mild disc space loss with circumferential disc bulge. Moderate facet and ligament flavum hypertrophy. Moderate to severe T5 foraminal stenosis.   T6-T7: Mild circumferential disc bulge with superimposed small right paracentral disc protrusion (series 8, image 19). Mild facet and ligament flavum hypertrophy here. No stenosis.   T7-T8: Circumferential disc bulge with broad-based left paracentral component of disc on series 8, image 23. Moderate facet and ligament flavum hypertrophy. No spinal stenosis. Moderate left greater than right T7 foraminal stenosis.   T8-T9: Circumferential disc bulge. Moderate facet and ligament flavum hypertrophy. Moderate to severe left and mild to moderate right T8 foraminal stenosis.   T9-T10: Possible anterior vacuum disc. Circumferential disc osteophyte complex. Moderate facet and ligament flavum hypertrophy. But no convincing stenosis at this  level.   T10-T11: Prior posterior fusion. Largely negative disc and no stenosis.   T11-T12: Prior posterior fusion. Evidence of solid interbody arthrodesis. No  stenosis.   T12-L1: Prior fusion with evidence of solid arthrodesis. This level was better demonstrated in March, with no stenosis evident.   IMPRESSION: 1. Prior posterior fusion from T10 through the upper lumbar spine, with evidence of solid arthrodesis and no lower thoracic stenosis. 2. Elsewhere the dominant thoracic degenerative finding is facet arthropathy and ligament flavum hypertrophy, with multilevel mild anterolisthesis. Occasional advanced disc degeneration (T1-T2 and the T9-T10 adjacent segment). But no thoracic spinal stenosis. No thoracic spinal cord abnormality. 3. There is moderate or severe thoracic neural foraminal stenosis at the bilateral T1, T2, T3, T4, T5, and left T8 nerve levels.     Electronically Signed   By: Genevie Ann M.D.   On: 03/09/2021 05:40     Objective:  VS:  HT:    WT:   BMI:     BP:95/63  HR:80bpm  TEMP: ( )  RESP:  Physical Exam Vitals and nursing note reviewed.  Constitutional:      General: He is not in acute distress.    Appearance: Normal appearance. He is not ill-appearing.  HENT:     Head: Normocephalic and atraumatic.     Right Ear: External ear normal.     Left Ear: External ear normal.     Nose: No congestion.  Eyes:     Extraocular Movements: Extraocular movements intact.  Cardiovascular:     Rate and Rhythm: Normal rate.     Pulses: Normal pulses.  Pulmonary:     Effort: Pulmonary effort is normal. No respiratory distress.  Abdominal:     General: There is no distension.     Palpations: Abdomen is soft.  Musculoskeletal:        General: No tenderness or signs of injury.     Cervical back: Neck supple.     Right lower leg: No edema.     Left lower leg: No edema.     Comments: Patient has good distal strength without clonus.  He has increased thoracic kyphosis.  Seated some pain with palpating.  Some pain with palpating over the vertebral body.  Obvious scoliotic deformity.  Skin:    Findings: No erythema or rash.   Neurological:     General: No focal deficit present.     Mental Status: He is alert and oriented to person, place, and time.     Sensory: No sensory deficit.     Motor: No weakness or abnormal muscle tone.     Coordination: Coordination normal.  Psychiatric:        Mood and Affect: Mood normal.        Behavior: Behavior normal.     Imaging: No results found.

## 2021-05-12 NOTE — Procedures (Signed)
Thoracic epidural Steroid Injection - Interlaminar Approach with Fluoroscopic Guidance  Patient: Jesse Robbins      Date of Birth: 12/01/1940 MRN: [id number] PCP: [name], MD      Visit Date: 05/10/2021   Universal Protocol:     Consent Given By: the patient  Position: PRONE  Additional Comments: Vital signs were monitored before and after the procedure. Patient was prepped and draped in the usual sterile fashion. The correct patient, procedure, and site was verified.   Injection Procedure Details:   Procedure diagnoses:  1. Thoracic radiculopathy      Meds Administered:  Meds ordered this encounter  Medications   betamethasone acetate-betamethasone sodium phosphate (CELESTONE) injection 12 mg     Laterality: Right  Location/Site:  T9-10  Needle: 3.5 in., 20 ga. Tuohy  Needle Placement: Paramedian epidural  Findings:   -Comments:  Very difficult time entering the epidural space do to kyphotic curvature and hardware below the injection site and visibility.  Did get loss-of-resistance and some flow of contrast.  Procedure Details: The fluoroscope was aligned to square off the endplates at the level noted above.  The target area located is the inferior or caudal portion of the vertebral body which is essentially the superior lamina of the level indicated and the overlying structure was infiltrated with 2 to 3 mL of 1% lidocaine without epinephrine and a 22-gauge spinal needle was introduced down to the lamina until contact with bone.  This needle was utilized as a locating needle.  The fluoroscope was subsequently tilted in a caudal direction to achieve the most open interlaminar spacing noted.  Using a paramedian approach from the side mentioned above, the region overlying the interlaminar space was localized under fluoroscopic visualization and the soft tissues overlying this structure were infiltrated with 4 ml. of 1% Lidocaine without Epinephrine. The Tuohy needle  was inserted into the epidural space using a paramedian approach and biplanar fluoroscopy.   The epidural space was localized using loss of resistance along with lateral or counter oblique and bi-planar fluoroscopic views.  After negative aspirate for air, blood, and CSF, a 2 ml. volume of Isovue-250 was injected into the epidural space and the flow of contrast was observed. Radiographs were obtained for documentation purposes.    The injectate was administered into the level noted above.   Additional Comments:  The patient tolerated the procedure well Dressing: 2 x 2 sterile gauze and Band-Aid    Post-procedure details: Patient was observed during the procedure. Post-procedure instructions were reviewed.  Patient left the clinic in stable condition.

## 2021-05-24 ENCOUNTER — Ambulatory Visit (INDEPENDENT_AMBULATORY_CARE_PROVIDER_SITE_OTHER): Payer: Medicare Other | Admitting: Physical Medicine and Rehabilitation

## 2021-05-24 ENCOUNTER — Encounter (INDEPENDENT_AMBULATORY_CARE_PROVIDER_SITE_OTHER): Payer: Medicare Other | Admitting: Ophthalmology

## 2021-05-24 ENCOUNTER — Encounter: Payer: Self-pay | Admitting: Physical Medicine and Rehabilitation

## 2021-05-24 ENCOUNTER — Other Ambulatory Visit: Payer: Self-pay

## 2021-05-24 ENCOUNTER — Ambulatory Visit: Payer: Self-pay

## 2021-05-24 VITALS — BP 125/69 | HR 89

## 2021-05-24 DIAGNOSIS — M5414 Radiculopathy, thoracic region: Secondary | ICD-10-CM

## 2021-05-24 DIAGNOSIS — H35033 Hypertensive retinopathy, bilateral: Secondary | ICD-10-CM | POA: Diagnosis not present

## 2021-05-24 DIAGNOSIS — H353221 Exudative age-related macular degeneration, left eye, with active choroidal neovascularization: Secondary | ICD-10-CM | POA: Diagnosis not present

## 2021-05-24 DIAGNOSIS — I10 Essential (primary) hypertension: Secondary | ICD-10-CM | POA: Diagnosis not present

## 2021-05-24 DIAGNOSIS — M4325 Fusion of spine, thoracolumbar region: Secondary | ICD-10-CM

## 2021-05-24 DIAGNOSIS — H353112 Nonexudative age-related macular degeneration, right eye, intermediate dry stage: Secondary | ICD-10-CM

## 2021-05-24 DIAGNOSIS — H43813 Vitreous degeneration, bilateral: Secondary | ICD-10-CM

## 2021-05-24 MED ORDER — DEXAMETHASONE SODIUM PHOSPHATE 10 MG/ML IJ SOLN
15.0000 mg | Freq: Once | INTRAMUSCULAR | Status: AC
  Administered 2021-05-24: 15 mg

## 2021-05-24 NOTE — Progress Notes (Signed)
Pt state mid back pain. Pt state sitting and walking makes the pain worse. Pt state he take pain meds to help ease his pain. Pt has hx of inj on 05/10/21 pt state it helped a little.  Numeric Pain Rating Scale and Functional Assessment Average Pain 2   In the last MONTH (on 0-10 scale) has pain interfered with the following?  1. General activity like being  able to carry out your everyday physical activities such as walking, climbing stairs, carrying groceries, or moving a chair?  Rating(8)   +Driver, -BT, -Dye Allergies.

## 2021-05-24 NOTE — Patient Instructions (Signed)

## 2021-05-25 NOTE — Procedures (Signed)
Thoracic Transforaminal Epidural Steroid Injection - Infraneural Approach with Fluoroscopic Guidance  Patient: Jesse Robbins      Date of Birth: 08/07/1941 MRN: [id number] PCP: [name], MD      Visit Date: 05/24/2021   Universal Protocol:    Date/Time: 05/24/2021  Consent Given By: the patient  Position: PRONE  Additional Comments: Vital signs were monitored before and after the procedure. Patient was prepped and draped in the usual sterile fashion. The correct patient, procedure, and site was verified.   Injection Procedure Details:   Procedure diagnoses: Thoracic radiculopathy [M54.14]    Meds Administered:  Meds ordered this encounter  Medications   dexamethasone (DECADRON) injection 15 mg    Laterality: Right  Location/Site:  T9-10  Needle:5.0 in., 22 ga.  Short bevel or Quincke spinal needle  Needle Placement: Transforaminal  Findings:    -Comments: Excellent flow of contrast along the nerve, nerve root and into the epidural space.  Procedure Details: After squaring off the end-plates of the desired vertebral level to get a true AP view, the C-arm was obliqued to the painful side so that the superior articulating process is positioned about 1/3 the length inferior endplate.  The needle should be aimed toward the junction of the superior articular process and the transverse process of the inferior vertebrae. The needle's initial entry is in the lower third of the foramen or "low in the hole." The soft tissues overlying this target were infiltrated with 2-3 ml. of 1% Lidocaine without Epinephrine.  The spinal needle was then inserted and advanced toward the target using a "trajectory" view along the fluoroscope beam.  Under AP and lateral visualization, the needle was advanced so it did not puncture dura and did not traverse medially beyond the 6 o'clock position of the pedicle and just the SAP on lateral view. Bi-planar projections were used to confirm  position. Aspiration was confirmed to be negative for CSF and/or blood. A 1-2 ml. volume of Isovue-250 was injected and flow of contrast was noted at each level. Radiographs were obtained for documentation purposes.   After attaining the desired flow of contrast documented above, a 0.5 to 1.0 ml test dose of 0.25% Marcaine was injected into each respective transforaminal space.  The patient was observed for 90 seconds post injection.  After no sensory deficits were reported, and normal lower extremity motor function was noted,   the above injectate was administered so that equal amounts of the injectate were placed at each foramen (level) into the transforaminal epidural space.   Additional Comments:  The patient tolerated the procedure well Dressing: Band-Aid    Post-procedure details: Patient was observed during the procedure. Post-procedure instructions were reviewed. Follow-Up Instructions: Patient handout Patient left the clinic in stable condition.

## 2021-05-25 NOTE — Progress Notes (Signed)
Jesse Robbins - 80 y.o. male MRN [id number]  Date of birth: 1941-05-26  Office Visit Note: Visit Date: 05/24/2021 PCP: [name], MD Referred by: [name], MD  Subjective: Chief Complaint  Patient presents with   Middle Back - Pain   HPI:  Jesse Robbins is a 80 y.o. male who comes in today at the request of Dr. Basil Dess for planned Right T9-10 Thoracic Transforaminal epidural steroid injection with fluoroscopic guidance.  The patient has failed conservative care including home exercise, medications, time and activity modification.  This injection will be diagnostic and hopefully therapeutic.  Please see requesting physician notes for further details and justification. MRI reviewed with images and spine model.  MRI reviewed in the note below.  Mr. Penney has had 2 interlaminar epidural injections at this level with the first injection helping quite a bit the second injection helping a little bit to some degree.  Last injection was very hard technically with his body angles from the scoliosis in that level.  The patient continues to have symptoms where he cannot walk a given distance without a lot of pain.  On a daily basis with just other activities is not really having a lot of pain in general.  Transforaminal approach will be tried he will follow-up with Dr. Louanne Skye.   ROS Otherwise per HPI.  Assessment & Plan: Visit Diagnoses:    ICD-10-CM   1. Thoracic radiculopathy  M54.14 XR C-ARM NO REPORT    Epidural Steroid injection    dexamethasone (DECADRON) injection 15 mg    2. Fusion of spine, thoracolumbar region  M43.25 XR C-ARM NO REPORT    Epidural Steroid injection    dexamethasone (DECADRON) injection 15 mg      Plan: No additional findings.   Meds & Orders:  Meds ordered this encounter  Medications   dexamethasone (DECADRON) injection 15 mg    Orders Placed This Encounter  Procedures   XR C-ARM NO REPORT   Epidural Steroid injection    Follow-up: Return  for visit to requesting physician as needed.   Procedures: No procedures performed  Thoracic Transforaminal Epidural Steroid Injection - Infraneural Approach with Fluoroscopic Guidance  Patient: Jesse Robbins      Date of Birth: 02-08-1941 MRN: [id number] PCP: [name], MD      Visit Date: 05/24/2021   Universal Protocol:    Date/Time: 05/24/2021  Consent Given By: the patient  Position: PRONE  Additional Comments: Vital signs were monitored before and after the procedure. Patient was prepped and draped in the usual sterile fashion. The correct patient, procedure, and site was verified.   Injection Procedure Details:   Procedure diagnoses: Thoracic radiculopathy [M54.14]    Meds Administered:  Meds ordered this encounter  Medications   dexamethasone (DECADRON) injection 15 mg    Laterality: Right  Location/Site:  T9-10  Needle:5.0 in., 22 ga.  Short bevel or Quincke spinal needle  Needle Placement: Transforaminal  Findings:    -Comments: Excellent flow of contrast along the nerve, nerve root and into the epidural space.  Procedure Details: After squaring off the end-plates of the desired vertebral level to get a true AP view, the C-arm was obliqued to the painful side so that the superior articulating process is positioned about 1/3 the length inferior endplate.  The needle should be aimed toward the junction of the superior articular process and the transverse process of the inferior vertebrae. The needle's initial entry is in the lower third of  the foramen or "low in the hole." The soft tissues overlying this target were infiltrated with 2-3 ml. of 1% Lidocaine without Epinephrine.  The spinal needle was then inserted and advanced toward the target using a "trajectory" view along the fluoroscope beam.  Under AP and lateral visualization, the needle was advanced so it did not puncture dura and did not traverse medially beyond the 6 o'clock position of the  pedicle and just the SAP on lateral view. Bi-planar projections were used to confirm position. Aspiration was confirmed to be negative for CSF and/or blood. A 1-2 ml. volume of Isovue-250 was injected and flow of contrast was noted at each level. Radiographs were obtained for documentation purposes.   After attaining the desired flow of contrast documented above, a 0.5 to 1.0 ml test dose of 0.25% Marcaine was injected into each respective transforaminal space.  The patient was observed for 90 seconds post injection.  After no sensory deficits were reported, and normal lower extremity motor function was noted,   the above injectate was administered so that equal amounts of the injectate were placed at each foramen (level) into the transforaminal epidural space.   Additional Comments:  The patient tolerated the procedure well Dressing: Band-Aid    Post-procedure details: Patient was observed during the procedure. Post-procedure instructions were reviewed. Follow-Up Instructions: Patient handout Patient left the clinic in stable condition.        Clinical History: MRI THORACIC SPINE WITHOUT CONTRAST     COMPARISON:  Cervical spine MRI from the same day reported separately. Lumbar MRI 12/20/2020. Thoracic spine radiographs 10/30/2020.   FINDINGS: Limited cervical spine imaging:  Reported separately.   Thoracic spine segmentation: Appears to be normal. In this numbering system is concordant with the March lumbar MRI.   Alignment: Preserved thoracic kyphosis with mild dextroconvex thoracic scoliosis, stable from January radiographs. Subtle spondylolisthesis in the upper thoracic levels, anterolisthesis T1-T2, T3-T4 and T4-T5 measuring about 2 mm. There is similar subtle anterolisthesis of T8 on T9.   Vertebrae: Confluent degenerative appearing endplate marrow edema at T1-T2 eccentric to the left (series 4, image 15). See additional details of that level below.   Moderate marrow  edema also at the T9 anterior inferior endplate.   Mild hardware susceptibility artifact beginning at T10 and continuing through the upper lumbar spine. No other marrow edema or acute osseous abnormality. A small T8 inferior endplate Schmorl's node is chronic.   Cord: Lower thoracic spinal cord detail from T10 inferiorly is limited by battle susceptibility artifact. But no spinal cord signal abnormality is identified. In the thoracic canal is capacious at most levels.   Paraspinal and other soft tissues: Negative.   Disc levels:   T1-T2: Severe disc space loss with probable vacuum disc. Circumferential disc osteophyte complex with foraminal involvement. Mild to moderate facet and ligament flavum hypertrophy. But no spinal stenosis. Moderate left and severe right T1 foraminal stenosis.   T2-T3: Disc space loss with circumferential disc osteophyte complex, bulky at the foramina. Mild to moderate facet and ligament flavum hypertrophy. No spinal stenosis. Severe bilateral T2 foraminal stenosis.   T3-T4: Mild disc space loss and disc bulging. Moderate facet and ligament flavum hypertrophy. No spinal stenosis. Moderate to severe bilateral T3 foraminal stenosis.   T4-T5: Relatively normal disc. Moderate facet and ligament flavum hypertrophy. Moderate to severe T4 foraminal stenosis greater on the left.   T5-T6: Mild disc space loss with circumferential disc bulge. Moderate facet and ligament flavum hypertrophy. Moderate to severe T5 foraminal  stenosis.   T6-T7: Mild circumferential disc bulge with superimposed small right paracentral disc protrusion (series 8, image 19). Mild facet and ligament flavum hypertrophy here. No stenosis.   T7-T8: Circumferential disc bulge with broad-based left paracentral component of disc on series 8, image 23. Moderate facet and ligament flavum hypertrophy. No spinal stenosis. Moderate left greater than right T7 foraminal stenosis.   T8-T9:  Circumferential disc bulge. Moderate facet and ligament flavum hypertrophy. Moderate to severe left and mild to moderate right T8 foraminal stenosis.   T9-T10: Possible anterior vacuum disc. Circumferential disc osteophyte complex. Moderate facet and ligament flavum hypertrophy. But no convincing stenosis at this level.   T10-T11: Prior posterior fusion. Largely negative disc and no stenosis.   T11-T12: Prior posterior fusion. Evidence of solid interbody arthrodesis. No stenosis.   T12-L1: Prior fusion with evidence of solid arthrodesis. This level was better demonstrated in March, with no stenosis evident.   IMPRESSION: 1. Prior posterior fusion from T10 through the upper lumbar spine, with evidence of solid arthrodesis and no lower thoracic stenosis. 2. Elsewhere the dominant thoracic degenerative finding is facet arthropathy and ligament flavum hypertrophy, with multilevel mild anterolisthesis. Occasional advanced disc degeneration (T1-T2 and the T9-T10 adjacent segment). But no thoracic spinal stenosis. No thoracic spinal cord abnormality. 3. There is moderate or severe thoracic neural foraminal stenosis at the bilateral T1, T2, T3, T4, T5, and left T8 nerve levels.     Electronically Signed   By: Genevie Ann M.D.   On: 03/09/2021 05:40     Objective:  VS:  HT:    WT:   BMI:     BP:125/69  HR:89bpm  TEMP: ( )  RESP:  Physical Exam Vitals and nursing note reviewed.  Constitutional:      General: He is not in acute distress.    Appearance: Normal appearance. He is not ill-appearing.  HENT:     Head: Normocephalic and atraumatic.     Right Ear: External ear normal.     Left Ear: External ear normal.     Nose: No congestion.  Eyes:     Extraocular Movements: Extraocular movements intact.  Cardiovascular:     Rate and Rhythm: Normal rate.     Pulses: Normal pulses.  Pulmonary:     Effort: Pulmonary effort is normal. No respiratory distress.  Abdominal:      General: There is no distension.     Palpations: Abdomen is soft.  Musculoskeletal:        General: No tenderness or signs of injury.     Cervical back: Neck supple.     Right lower leg: No edema.     Left lower leg: No edema.     Comments: Patient has good distal strength without clonus.  Obvious scoliotic deformity.  Skin:    Findings: No erythema or rash.  Neurological:     General: No focal deficit present.     Mental Status: He is alert and oriented to person, place, and time.     Sensory: No sensory deficit.     Motor: No weakness or abnormal muscle tone.     Coordination: Coordination normal.  Psychiatric:        Mood and Affect: Mood normal.        Behavior: Behavior normal.     Imaging: XR C-ARM NO REPORT  Result Date: 05/24/2021 Please see Notes tab for imaging impression.

## 2021-06-03 ENCOUNTER — Other Ambulatory Visit: Payer: Self-pay

## 2021-06-03 ENCOUNTER — Encounter: Payer: Self-pay | Admitting: Specialist

## 2021-06-03 ENCOUNTER — Ambulatory Visit (INDEPENDENT_AMBULATORY_CARE_PROVIDER_SITE_OTHER): Payer: Medicare Other | Admitting: Specialist

## 2021-06-03 ENCOUNTER — Ambulatory Visit: Payer: Self-pay

## 2021-06-03 VITALS — BP 113/66 | HR 80 | Ht 67.0 in | Wt 166.0 lb

## 2021-06-03 DIAGNOSIS — M4153 Other secondary scoliosis, cervicothoracic region: Secondary | ICD-10-CM

## 2021-06-03 DIAGNOSIS — M4804 Spinal stenosis, thoracic region: Secondary | ICD-10-CM | POA: Diagnosis not present

## 2021-06-03 DIAGNOSIS — M47814 Spondylosis without myelopathy or radiculopathy, thoracic region: Secondary | ICD-10-CM

## 2021-06-03 DIAGNOSIS — M4325 Fusion of spine, thoracolumbar region: Secondary | ICD-10-CM

## 2021-06-03 DIAGNOSIS — M25551 Pain in right hip: Secondary | ICD-10-CM

## 2021-06-03 DIAGNOSIS — M4014 Other secondary kyphosis, thoracic region: Secondary | ICD-10-CM | POA: Diagnosis not present

## 2021-06-03 DIAGNOSIS — R2689 Other abnormalities of gait and mobility: Secondary | ICD-10-CM | POA: Diagnosis not present

## 2021-06-03 NOTE — Patient Instructions (Signed)
Fall Prevention and Home Safety Falls cause injuries and can affect all age groups. It is possible to use preventive measures to significantly decrease the likelihood of falls. There are many simple measures which can make your home safer and prevent falls. OUTDOORS Repair cracks and edges of walkways and driveways. Remove high doorway thresholds. Trim shrubbery on the main path into your home. Have good outside lighting. Clear walkways of tools, rocks, debris, and clutter. Check that handrails are not broken and are securely fastened. Both sides of steps should have handrails. Have leaves, snow, and ice cleared regularly. Use sand or salt on walkways during winter months. In the garage, clean up grease or oil spills. BATHROOM Install night lights. Install grab bars by the toilet and in the tub and shower. Use non-skid mats or decals in the tub or shower. Place a plastic non-slip stool in the shower to sit on, if needed. Keep floors dry and clean up all water on the floor immediately. Remove soap buildup in the tub or shower on a regular basis. Secure bath mats with non-slip, double-sided rug tape. Remove throw rugs and tripping hazards from the floors. BEDROOMS Install night lights. Make sure a bedside light is easy to reach. Do not use oversized bedding. Keep a telephone by your bedside. Have a firm chair with side arms to use for getting dressed. Remove throw rugs and tripping hazards from the floor. KITCHEN Keep handles on pots and pans turned toward the center of the stove. Use back burners when possible. Clean up spills quickly and allow time for drying. Avoid walking on wet floors. Avoid hot utensils and knives. Position shelves so they are not too high or low. Place commonly used objects within easy reach. If necessary, use a sturdy step stool with a grab bar when reaching. Keep electrical cables out of the way. Do not use floor polish or wax that makes floors slippery.  If you must use wax, use non-skid floor wax. Remove throw rugs and tripping hazards from the floor. STAIRWAYS Never leave objects on stairs. Place handrails on both sides of stairways and use them. Fix any loose handrails. Make sure handrails on both sides of the stairways are as long as the stairs. Check carpeting to make sure it is firmly attached along stairs. Make repairs to worn or loose carpet promptly. Avoid placing throw rugs at the top or bottom of stairways, or properly secure the rug with carpet tape to prevent slippage. Get rid of throw rugs, if possible. Have an electrician put in a light switch at the top and bottom of the stairs. OTHER FALL PREVENTION TIPS Wear low-heel or rubber-soled shoes that are supportive and fit well. Wear closed toe shoes. When using a stepladder, make sure it is fully opened and both spreaders are firmly locked. Do not climb a closed stepladder. Add color or contrast paint or tape to grab bars and handrails in your home. Place contrasting color strips on first and last steps. Learn and use mobility aids as needed. Install an electrical emergency response system. Turn on lights to avoid dark areas. Replace light bulbs that burn out immediately. Get light switches that glow. Arrange furniture to create clear pathways. Keep furniture in the same place. Firmly attach carpet with non-skid or double-sided tape. Eliminate uneven floor surfaces. Select a carpet pattern that does not visually hide the edge of steps. Be aware of all pets. OTHER HOME SAFETY TIPS Set the water temperature for 120 F (48.8 C). Keep  emergency numbers on or near the telephone. Keep smoke detectors on every level of the home and near sleeping areas. Document Released: 09/23/2002 Document Revised: 04/03/2012 Document Reviewed: 12/23/2011 [facility] Patient Information 2014 [place]. Consider yoga and balance and coordination exercises. Use tylenol for pain. Beyond exercises  and flexibilty exercises to improve your ability to compensate for the sagittal alignment of the spine there is not much else that can be done short of spine osteotomies to restore the aligment on the side view of the spine.

## 2021-06-08 ENCOUNTER — Other Ambulatory Visit: Payer: Self-pay | Admitting: Urology

## 2021-06-08 DIAGNOSIS — C61 Malignant neoplasm of prostate: Secondary | ICD-10-CM

## 2021-06-11 ENCOUNTER — Other Ambulatory Visit (HOSPITAL_COMMUNITY): Payer: Self-pay | Admitting: Urology

## 2021-06-11 DIAGNOSIS — C61 Malignant neoplasm of prostate: Secondary | ICD-10-CM

## 2021-06-23 ENCOUNTER — Ambulatory Visit (HOSPITAL_COMMUNITY): Admission: RE | Admit: 2021-06-23 | Payer: Medicare Other | Source: Ambulatory Visit

## 2021-06-24 ENCOUNTER — Other Ambulatory Visit: Payer: Self-pay

## 2021-06-24 ENCOUNTER — Encounter (INDEPENDENT_AMBULATORY_CARE_PROVIDER_SITE_OTHER): Payer: Medicare Other | Admitting: Ophthalmology

## 2021-06-24 DIAGNOSIS — H353231 Exudative age-related macular degeneration, bilateral, with active choroidal neovascularization: Secondary | ICD-10-CM

## 2021-06-24 DIAGNOSIS — I10 Essential (primary) hypertension: Secondary | ICD-10-CM

## 2021-06-24 DIAGNOSIS — H43813 Vitreous degeneration, bilateral: Secondary | ICD-10-CM | POA: Diagnosis not present

## 2021-06-24 DIAGNOSIS — H35033 Hypertensive retinopathy, bilateral: Secondary | ICD-10-CM | POA: Diagnosis not present

## 2021-06-24 DIAGNOSIS — H2513 Age-related nuclear cataract, bilateral: Secondary | ICD-10-CM

## 2021-06-26 ENCOUNTER — Encounter: Payer: Self-pay | Admitting: Specialist

## 2021-06-28 ENCOUNTER — Ambulatory Visit (HOSPITAL_COMMUNITY)
Admission: RE | Admit: 2021-06-28 | Discharge: 2021-06-28 | Disposition: A | Discharge status: Home / Self Care | Payer: Medicare Other | Source: Ambulatory Visit | Attending: Urology | Admitting: Urology

## 2021-06-28 ENCOUNTER — Other Ambulatory Visit: Payer: Self-pay

## 2021-06-28 DIAGNOSIS — C61 Malignant neoplasm of prostate: Secondary | ICD-10-CM | POA: Diagnosis present

## 2021-06-28 MED ORDER — GADOBUTROL 1 MMOL/ML IV SOLN
7.5000 mL | Freq: Once | INTRAVENOUS | Status: AC | PRN
  Administered 2021-06-28: 7.5 mL via INTRAVENOUS

## 2021-07-12 ENCOUNTER — Other Ambulatory Visit (HOSPITAL_COMMUNITY): Payer: Self-pay | Admitting: Urology

## 2021-07-12 DIAGNOSIS — C61 Malignant neoplasm of prostate: Secondary | ICD-10-CM

## 2021-07-15 ENCOUNTER — Encounter (HOSPITAL_COMMUNITY)
Admission: RE | Admit: 2021-07-15 | Discharge: 2021-07-15 | Disposition: A | Discharge status: Home / Self Care | Payer: Medicare Other | Source: Ambulatory Visit | Attending: Urology | Admitting: Urology

## 2021-07-15 ENCOUNTER — Other Ambulatory Visit: Payer: Self-pay

## 2021-07-15 DIAGNOSIS — C61 Malignant neoplasm of prostate: Secondary | ICD-10-CM | POA: Diagnosis not present

## 2021-07-15 MED ORDER — PIFLIFOLASTAT F 18 (PYLARIFY) INJECTION
9.0000 | Freq: Once | INTRAVENOUS | Status: AC
  Administered 2021-07-15: 8.6 via INTRAVENOUS

## 2021-07-22 ENCOUNTER — Other Ambulatory Visit: Payer: Self-pay

## 2021-07-22 ENCOUNTER — Encounter (INDEPENDENT_AMBULATORY_CARE_PROVIDER_SITE_OTHER): Payer: Medicare Other | Admitting: Ophthalmology

## 2021-07-22 DIAGNOSIS — H353231 Exudative age-related macular degeneration, bilateral, with active choroidal neovascularization: Secondary | ICD-10-CM

## 2021-07-22 DIAGNOSIS — H35033 Hypertensive retinopathy, bilateral: Secondary | ICD-10-CM

## 2021-07-22 DIAGNOSIS — H43813 Vitreous degeneration, bilateral: Secondary | ICD-10-CM

## 2021-07-22 DIAGNOSIS — I10 Essential (primary) hypertension: Secondary | ICD-10-CM

## 2021-07-22 DIAGNOSIS — H2513 Age-related nuclear cataract, bilateral: Secondary | ICD-10-CM

## 2021-08-03 ENCOUNTER — Other Ambulatory Visit (HOSPITAL_COMMUNITY): Payer: Self-pay | Admitting: Family Medicine

## 2021-08-03 DIAGNOSIS — I6521 Occlusion and stenosis of right carotid artery: Secondary | ICD-10-CM

## 2021-08-05 ENCOUNTER — Other Ambulatory Visit: Payer: Self-pay

## 2021-08-05 ENCOUNTER — Ambulatory Visit (HOSPITAL_COMMUNITY)
Admission: RE | Admit: 2021-08-05 | Discharge: 2021-08-05 | Disposition: A | Discharge status: Home / Self Care | Payer: Medicare Other | Source: Ambulatory Visit | Attending: Family Medicine | Admitting: Family Medicine

## 2021-08-05 DIAGNOSIS — I6521 Occlusion and stenosis of right carotid artery: Secondary | ICD-10-CM | POA: Diagnosis present

## 2021-08-19 ENCOUNTER — Other Ambulatory Visit: Payer: Self-pay

## 2021-08-19 ENCOUNTER — Encounter (INDEPENDENT_AMBULATORY_CARE_PROVIDER_SITE_OTHER): Payer: Medicare Other | Admitting: Ophthalmology

## 2021-08-19 DIAGNOSIS — H353231 Exudative age-related macular degeneration, bilateral, with active choroidal neovascularization: Secondary | ICD-10-CM | POA: Diagnosis not present

## 2021-08-19 DIAGNOSIS — I10 Essential (primary) hypertension: Secondary | ICD-10-CM | POA: Diagnosis not present

## 2021-08-19 DIAGNOSIS — H43813 Vitreous degeneration, bilateral: Secondary | ICD-10-CM

## 2021-08-19 DIAGNOSIS — H35033 Hypertensive retinopathy, bilateral: Secondary | ICD-10-CM

## 2021-08-23 ENCOUNTER — Ambulatory Visit: Payer: Self-pay

## 2021-08-23 ENCOUNTER — Ambulatory Visit (INDEPENDENT_AMBULATORY_CARE_PROVIDER_SITE_OTHER): Payer: Medicare Other | Admitting: Orthopaedic Surgery

## 2021-08-23 DIAGNOSIS — Z96641 Presence of right artificial hip joint: Secondary | ICD-10-CM | POA: Diagnosis not present

## 2021-08-23 DIAGNOSIS — G8929 Other chronic pain: Secondary | ICD-10-CM | POA: Diagnosis not present

## 2021-08-23 DIAGNOSIS — M1712 Unilateral primary osteoarthritis, left knee: Secondary | ICD-10-CM

## 2021-08-23 DIAGNOSIS — M25562 Pain in left knee: Secondary | ICD-10-CM | POA: Diagnosis not present

## 2021-08-23 DIAGNOSIS — I6521 Occlusion and stenosis of right carotid artery: Secondary | ICD-10-CM | POA: Diagnosis not present

## 2021-08-23 MED ORDER — METHYLPREDNISOLONE ACETATE 40 MG/ML IJ SUSP
40.0000 mg | INTRAMUSCULAR | Status: AC | PRN
  Administered 2021-08-23: 40 mg via INTRA_ARTICULAR

## 2021-08-23 MED ORDER — LIDOCAINE HCL 1 % IJ SOLN
3.0000 mL | INTRAMUSCULAR | Status: AC | PRN
Start: 2021-08-23 — End: 2021-08-23
  Administered 2021-08-23: 3 mL

## 2021-08-23 NOTE — Progress Notes (Signed)
Office Visit Note   Patient: Jesse Robbins           Date of Birth: 01-29-1941           MRN: [id number] Visit Date: 08/23/2021              Requested by: [name], MD [address] [place],  [place] [address] PCP: [name], MD   Assessment & Plan: Visit Diagnoses:  1. History of right hip replacement   2. Chronic pain of left knee   3. Unilateral primary osteoarthritis, left knee     Plan: I was able to aspirate about 30 cc of fluid off of his left knee that was consistent with osteoarthritis type of fluid.  I placed a steroid injection in the knee as well.  He tolerated it well.  We will see him back in 4 weeks to see how he is doing overall.  I did give him a handout for considering hyaluronic acid in the future.  I gave him reassurance that his right hip is doing well.  I think his leg atrophy is mainly due to chronic back issues.  Follow-Up Instructions: Return in about 4 weeks (around 09/20/2021).   Orders:  Orders Placed This Encounter  Procedures   Large Joint Inj   XR HIP UNILAT W OR W/O PELVIS 1V RIGHT   XR Knee 1-2 Views Left   No orders of the defined types were placed in this encounter.     Procedures: Large Joint Inj: L knee on 08/23/2021 2:06 PM Indications: diagnostic evaluation and pain Details: 22 G 1.5 in needle, superolateral approach  Arthrogram: No  Medications: 3 mL lidocaine 1 %; 40 mg methylPREDNISolone acetate 40 MG/ML Outcome: tolerated well, no immediate complications Procedure, treatment alternatives, risks and benefits explained, specific risks discussed. Consent was given by the patient. Immediately prior to procedure a time out was called to verify the correct patient, procedure, equipment, support staff and site/side marked as required. Patient was prepped and draped in the usual sterile fashion.      Clinical Data: No additional findings.   Subjective: Chief Complaint  Patient presents with   Right Hip -  Follow-up   Left Knee - Pain  The patient is someone with who we replaced his right hip in 2014.  I have aspirated and injected his right knee before.  Now his left knee is bothering him and swollen.  He does report leg atrophy on the right side.  He has is extensive thoracolumbar fusion at multiple levels of his lumbar spine.  Has been having some sciatic symptoms on the right side that radiates down his right leg.  He denies any groin pain on the right side he denies any numbness and tingling of thigh anteriorly.  The left knee has been swollen and bothersome to him and hurting with weightbearing activities.  Going up and down stairs is also painful.  It has been sitting for a while and gets up from a seated position is uncomfortable and has stiffness and pain in the left knee.  There is otherwise been no acute changes in medical status  HPI  Review of Systems There is currently listed no headache, chest pain, shortness of breath, fever, chills, nausea, vomiting  Objective: Vital Signs: There were no vitals taken for this visit.  Physical Exam He is alert and orient x3 and in no acute distress Ortho Exam Examination of his right operative hip shows it moves smoothly and  fluidly with no blocks or rotation and no pain in the groin at all.  His left knee has a moderate effusion and significant patellofemoral crepitation as well as lateral joint line tenderness.  His left knee is ligamentously stable. Specialty Comments:  No specialty comments available.  Imaging: XR HIP UNILAT W OR W/O PELVIS 1V RIGHT  Result Date: 08/23/2021 An AP pelvis and lateral right hip shows a well-seated total hip arthroplasty that is bone ingrown with no wear or complicating features.  XR Knee 1-2 Views Left  Result Date: 08/23/2021 Views of the left knee show moderate to severe tricompartment arthritis with lateral joint space narrowing and significant patellofemoral narrowing.  There is also sclerotic changes.     PMFS History: Patient Active Problem List   Diagnosis Date Noted   Fusion of spine, thoracolumbar region 01/13/2021   Abnormal gait 01/13/2021   PSA elevation 11/01/2018   Continuous chronic alcoholism ([place]) 12/05/2013   CAFL (chronic airflow limitation) ([facility]) 12/05/2013   Acid reflux 12/05/2013   H/O malignant neoplasm of prostate 12/05/2013   History of repair of hip joint 12/05/2013   H/O transient cerebral ischemia 12/05/2013   Chronic obstructive pulmonary disease ([place]) 12/05/2013   Idiopathic scoliosis and kyphoscoliosis 08/21/2013   Lumbar canal stenosis 08/21/2013   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[place]) 04/16/2013   Pre-op evaluation 04/06/2013   Essential hypertension 04/06/2013   Dyslipidemia 04/06/2013   Past Medical History:  Diagnosis Date   Alcohol abuse    Anemia    Anxiety    Arthritis    Cervical spinal stenosis    Chronic gastritis    COPD (chronic obstructive pulmonary disease) ([facility])    Depression    Diverticulosis    Headache(784.0)    migraine   HTN (hypertension)    Hyperlipidemia    Lumbar spinal stenosis    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 Right 04/16/2013   Procedure: RIGHT TOTAL HIP ARTHROPLASTY ANTERIOR APPROACH;  Surgeon: Mcarthur Rossetti, MD;  Location: [facility];  Service: Orthopedics;  Laterality: Right;   Social  History   Occupational History   Not on file  Tobacco Use   Smoking status: Former    Packs/day: 1.00    Years: 25.00    Pack years: 25.00    Types: Cigarettes    Quit date: 04/05/1998    Years since quitting: 23.4   Smokeless tobacco: Never  Vaping Use   Vaping Use: Never used  Substance and Sexual Activity   Alcohol use: Yes    Alcohol/week: 7.0 standard drinks    Types: 7 Shots of liquor per week  Comment: 6 drinks every day; stopped as of 10/2020   Drug use: Yes    Types: Marijuana    Comment: less then 1/2 gm a day   Sexual activity: Not on file

## 2021-09-16 ENCOUNTER — Encounter (INDEPENDENT_AMBULATORY_CARE_PROVIDER_SITE_OTHER): Payer: Medicare Other | Admitting: Ophthalmology

## 2021-09-20 ENCOUNTER — Other Ambulatory Visit: Payer: Self-pay

## 2021-09-20 ENCOUNTER — Telehealth: Payer: Self-pay

## 2021-09-20 ENCOUNTER — Encounter: Payer: Self-pay | Admitting: Orthopaedic Surgery

## 2021-09-20 ENCOUNTER — Ambulatory Visit (INDEPENDENT_AMBULATORY_CARE_PROVIDER_SITE_OTHER): Payer: Medicare Other | Admitting: Orthopaedic Surgery

## 2021-09-20 DIAGNOSIS — G8929 Other chronic pain: Secondary | ICD-10-CM

## 2021-09-20 DIAGNOSIS — M25562 Pain in left knee: Secondary | ICD-10-CM | POA: Diagnosis not present

## 2021-09-20 DIAGNOSIS — I6521 Occlusion and stenosis of right carotid artery: Secondary | ICD-10-CM | POA: Diagnosis not present

## 2021-09-20 DIAGNOSIS — M1712 Unilateral primary osteoarthritis, left knee: Secondary | ICD-10-CM

## 2021-09-20 NOTE — Progress Notes (Signed)
The patient is a 80 year old gentleman who comes in for continued follow-up as it relates to his left knee.  He has osteoarthritis of that left knee.  At his last visit a month ago we placed a steroid injection in the knee after aspirating the knee.  He has had a reaccumulation of fluid as well.  He does have moderate to large left knee joint effusion.  I was able to aspirate 60 cc of fluid off the knee consistent with osteoarthritis.  I did not place a steroid injection back in the knee since we just did this 4 weeks ago.  I had previously given him a handout for hyaluronic acid.  He is definitely interested in trying that.  After aspirating the fluid off his knee he did feel better.  We will order hyaluronic acid to treat the osteoarthritis pain of his left knee given the failure of conservative treatment including  A steroid injection.  At his next visit we will likely aspirate the knee again and inject it with a lidocaine injection prior to the hyaluronic acid injection and aspiration.  All questions and concerns were answered and addressed.

## 2021-09-20 NOTE — Telephone Encounter (Signed)
Left knee gel

## 2021-09-21 ENCOUNTER — Encounter (INDEPENDENT_AMBULATORY_CARE_PROVIDER_SITE_OTHER): Payer: Medicare Other | Admitting: Ophthalmology

## 2021-09-21 DIAGNOSIS — H35033 Hypertensive retinopathy, bilateral: Secondary | ICD-10-CM | POA: Diagnosis not present

## 2021-09-21 DIAGNOSIS — H2513 Age-related nuclear cataract, bilateral: Secondary | ICD-10-CM

## 2021-09-21 DIAGNOSIS — H43813 Vitreous degeneration, bilateral: Secondary | ICD-10-CM

## 2021-09-21 DIAGNOSIS — I10 Essential (primary) hypertension: Secondary | ICD-10-CM | POA: Diagnosis not present

## 2021-09-21 DIAGNOSIS — H353231 Exudative age-related macular degeneration, bilateral, with active choroidal neovascularization: Secondary | ICD-10-CM | POA: Diagnosis not present

## 2021-09-27 NOTE — Telephone Encounter (Signed)
Noted  

## 2021-10-06 ENCOUNTER — Telehealth: Payer: Self-pay

## 2021-10-06 NOTE — Telephone Encounter (Signed)
VOB submitted, Monovisc, left knee. BV pending

## 2021-10-20 ENCOUNTER — Telehealth: Payer: Self-pay

## 2021-10-20 NOTE — Telephone Encounter (Signed)
Called and left a VM for patient to CB to schedule for gel injection with Dr. Ninfa Linden.  Approved for Monovisc, left knee. Elim will pick up remaining eligible expenses at 100% Will cover medicare Part B deductible No Co-pay No PA required

## 2021-10-25 ENCOUNTER — Encounter (INDEPENDENT_AMBULATORY_CARE_PROVIDER_SITE_OTHER): Payer: Medicare Other | Admitting: Ophthalmology

## 2021-10-25 ENCOUNTER — Other Ambulatory Visit: Payer: Self-pay

## 2021-10-25 DIAGNOSIS — H43813 Vitreous degeneration, bilateral: Secondary | ICD-10-CM

## 2021-10-25 DIAGNOSIS — H35033 Hypertensive retinopathy, bilateral: Secondary | ICD-10-CM | POA: Diagnosis not present

## 2021-10-25 DIAGNOSIS — I10 Essential (primary) hypertension: Secondary | ICD-10-CM

## 2021-10-25 DIAGNOSIS — H353231 Exudative age-related macular degeneration, bilateral, with active choroidal neovascularization: Secondary | ICD-10-CM

## 2021-10-27 ENCOUNTER — Encounter: Payer: Self-pay | Admitting: Physician Assistant

## 2021-10-27 ENCOUNTER — Ambulatory Visit (INDEPENDENT_AMBULATORY_CARE_PROVIDER_SITE_OTHER): Payer: Medicare Other | Admitting: Physician Assistant

## 2021-10-27 ENCOUNTER — Other Ambulatory Visit: Payer: Self-pay

## 2021-10-27 DIAGNOSIS — M1712 Unilateral primary osteoarthritis, left knee: Secondary | ICD-10-CM

## 2021-10-27 MED ORDER — LIDOCAINE HCL 1 % IJ SOLN
3.0000 mL | INTRAMUSCULAR | Status: AC | PRN
  Administered 2021-10-27: 3 mL

## 2021-10-27 MED ORDER — HYALURONAN 88 MG/4ML IX SOSY
88.0000 mg | PREFILLED_SYRINGE | INTRA_ARTICULAR | Status: AC | PRN
  Administered 2021-10-27: 88 mg via INTRA_ARTICULAR

## 2021-10-27 NOTE — Progress Notes (Signed)
° °  Procedure Note  Patient: Jesse Robbins             Date of Birth: [birth year]           MRN: [id number]             Visit Date: 10/27/2021  HPI: Mr. Herbers comes in today for scheduled Monovisc injection left knee.  He has known osteoarthritis of the left knee.  He is tried conservative treatment and still has pain in the left knee.  He denies any recent falls or injuries.  He has no known upcoming knee surgery next 6 months.  Physical exam: Left knee positive effusion no abnormal warmth erythema.  Procedures: Visit Diagnoses:  1. Unilateral primary osteoarthritis, left knee     Large Joint Inj on 10/27/2021 5:21 PM Indications: pain Details: 22 G 1.5 in needle, anterolateral approach  Arthrogram: No  Medications: 88 mg Hyaluronan 88 MG/4ML; 3 mL lidocaine 1 % Aspirate: 20 mL cloudy and blood-tinged Outcome: tolerated well, no immediate complications Procedure, treatment alternatives, risks and benefits explained, specific risks discussed. Consent was given by the patient. Immediately prior to procedure a time out was called to verify the correct patient, procedure, equipment, support staff and site/side marked as required. Patient was prepped and draped in the usual sterile fashion.    Plan: Ace bandage was applied to the knee after aspiration injection.  Patient tolerated this procedure well.  He will follow-up with [place] in 8 weeks see how he is doing overall he understands he needs to wait 6 months between supplemental injections.  He will remove the Ace bandage this evening prior to going to bed.

## 2021-11-29 ENCOUNTER — Encounter (INDEPENDENT_AMBULATORY_CARE_PROVIDER_SITE_OTHER): Payer: Medicare Other | Admitting: Ophthalmology

## 2021-11-29 ENCOUNTER — Other Ambulatory Visit: Payer: Self-pay

## 2021-11-29 DIAGNOSIS — H35033 Hypertensive retinopathy, bilateral: Secondary | ICD-10-CM

## 2021-11-29 DIAGNOSIS — I10 Essential (primary) hypertension: Secondary | ICD-10-CM

## 2021-11-29 DIAGNOSIS — H353231 Exudative age-related macular degeneration, bilateral, with active choroidal neovascularization: Secondary | ICD-10-CM | POA: Diagnosis not present

## 2021-11-29 DIAGNOSIS — H43813 Vitreous degeneration, bilateral: Secondary | ICD-10-CM | POA: Diagnosis not present

## 2021-11-29 DIAGNOSIS — H2513 Age-related nuclear cataract, bilateral: Secondary | ICD-10-CM

## 2021-12-22 ENCOUNTER — Encounter: Payer: Self-pay | Admitting: Physician Assistant

## 2021-12-22 ENCOUNTER — Ambulatory Visit (INDEPENDENT_AMBULATORY_CARE_PROVIDER_SITE_OTHER): Payer: Medicare Other | Admitting: Physician Assistant

## 2021-12-22 DIAGNOSIS — M1712 Unilateral primary osteoarthritis, left knee: Secondary | ICD-10-CM | POA: Diagnosis not present

## 2021-12-22 NOTE — Progress Notes (Signed)
HPI: Jesse Robbins returns today 8 weeks status post Monovisc injection left knee.  He states that he can tell a difference in regards to the pain he is having in the knee particularly going downstairs or deep squats.  He still has some discomfort with these motions but this is not to the degree he was having prior to the injection.  He has no pain with this regular ambulation. ? ?ROS: See HPI ? ?Physical exam: General well-developed well-nourished male in no acute distress. ?Left knee: No abnormal warmth erythema or effusion.  Good range of motion of the left knee with significant patellofemoral crepitus.  No gross instability. ? ?Impression: Left knee osteoarthritis ? ?Plan: Discussed with him quad strengthening exercises.  Knee friendly exercises discussed with him at length.  Use of Voltaren gel on the knee up to 4 g 4 times daily.  He understands to wait at least 3 months between cortisone injections in 6 months between supplemental injections. ?

## 2022-01-03 ENCOUNTER — Encounter (INDEPENDENT_AMBULATORY_CARE_PROVIDER_SITE_OTHER): Payer: Medicare Other | Admitting: Ophthalmology

## 2022-01-03 ENCOUNTER — Other Ambulatory Visit: Payer: Self-pay

## 2022-01-03 DIAGNOSIS — H35033 Hypertensive retinopathy, bilateral: Secondary | ICD-10-CM | POA: Diagnosis not present

## 2022-01-03 DIAGNOSIS — I10 Essential (primary) hypertension: Secondary | ICD-10-CM | POA: Diagnosis not present

## 2022-01-03 DIAGNOSIS — H353231 Exudative age-related macular degeneration, bilateral, with active choroidal neovascularization: Secondary | ICD-10-CM | POA: Diagnosis not present

## 2022-01-03 DIAGNOSIS — H2513 Age-related nuclear cataract, bilateral: Secondary | ICD-10-CM

## 2022-01-03 DIAGNOSIS — H43813 Vitreous degeneration, bilateral: Secondary | ICD-10-CM | POA: Diagnosis not present

## 2022-02-07 ENCOUNTER — Encounter (INDEPENDENT_AMBULATORY_CARE_PROVIDER_SITE_OTHER): Payer: Medicare Other | Admitting: Ophthalmology

## 2022-02-07 DIAGNOSIS — H35033 Hypertensive retinopathy, bilateral: Secondary | ICD-10-CM | POA: Diagnosis not present

## 2022-02-07 DIAGNOSIS — H43813 Vitreous degeneration, bilateral: Secondary | ICD-10-CM

## 2022-02-07 DIAGNOSIS — I10 Essential (primary) hypertension: Secondary | ICD-10-CM

## 2022-02-07 DIAGNOSIS — H353231 Exudative age-related macular degeneration, bilateral, with active choroidal neovascularization: Secondary | ICD-10-CM | POA: Diagnosis not present

## 2022-03-15 ENCOUNTER — Encounter (INDEPENDENT_AMBULATORY_CARE_PROVIDER_SITE_OTHER): Payer: Medicare Other | Admitting: Ophthalmology

## 2022-03-15 DIAGNOSIS — H43813 Vitreous degeneration, bilateral: Secondary | ICD-10-CM

## 2022-03-15 DIAGNOSIS — H35033 Hypertensive retinopathy, bilateral: Secondary | ICD-10-CM

## 2022-03-15 DIAGNOSIS — H353231 Exudative age-related macular degeneration, bilateral, with active choroidal neovascularization: Secondary | ICD-10-CM

## 2022-03-15 DIAGNOSIS — I10 Essential (primary) hypertension: Secondary | ICD-10-CM

## 2022-04-25 ENCOUNTER — Other Ambulatory Visit: Payer: Self-pay | Admitting: Family Medicine

## 2022-04-25 ENCOUNTER — Ambulatory Visit
Admission: RE | Admit: 2022-04-25 | Discharge: 2022-04-25 | Disposition: A | Discharge status: Home / Self Care | Payer: Medicare Other | Source: Ambulatory Visit | Attending: Family Medicine | Admitting: Family Medicine

## 2022-04-25 ENCOUNTER — Encounter (INDEPENDENT_AMBULATORY_CARE_PROVIDER_SITE_OTHER): Payer: Medicare Other | Admitting: Ophthalmology

## 2022-04-25 DIAGNOSIS — I10 Essential (primary) hypertension: Secondary | ICD-10-CM | POA: Diagnosis not present

## 2022-04-25 DIAGNOSIS — H35033 Hypertensive retinopathy, bilateral: Secondary | ICD-10-CM | POA: Diagnosis not present

## 2022-04-25 DIAGNOSIS — M25511 Pain in right shoulder: Secondary | ICD-10-CM

## 2022-04-25 DIAGNOSIS — H353231 Exudative age-related macular degeneration, bilateral, with active choroidal neovascularization: Secondary | ICD-10-CM | POA: Diagnosis not present

## 2022-04-25 DIAGNOSIS — H43813 Vitreous degeneration, bilateral: Secondary | ICD-10-CM | POA: Diagnosis not present

## 2022-05-16 ENCOUNTER — Encounter: Payer: Self-pay | Admitting: Podiatry

## 2022-05-16 ENCOUNTER — Ambulatory Visit (INDEPENDENT_AMBULATORY_CARE_PROVIDER_SITE_OTHER): Payer: Medicare Other | Admitting: Podiatry

## 2022-05-16 DIAGNOSIS — L600 Ingrowing nail: Secondary | ICD-10-CM

## 2022-05-16 NOTE — Patient Instructions (Signed)

## 2022-05-16 NOTE — Progress Notes (Signed)
Subjective:   Patient ID: Jesse Robbins, male   DOB: 81 y.o.   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  Today I infiltrated the left hallux 60 mg like Marcaine mixture sterile prep done and using sterile instrumentation remove the medial border exposed matrix applied phenol 3 applications 30 seconds followed by alcohol lavage sterile dressing gave instructions on soaks and patient will be seen back again and is encouraged to call with questions concerns and leave dressing on 24 hours but take it off earlier if throbbing were to occur signed his

## 2022-05-20 ENCOUNTER — Ambulatory Visit: Payer: Medicare Other | Admitting: Podiatry

## 2022-06-06 ENCOUNTER — Encounter (INDEPENDENT_AMBULATORY_CARE_PROVIDER_SITE_OTHER): Payer: Medicare Other | Admitting: Ophthalmology

## 2022-12-01 IMAGING — MR MR THORACIC SPINE W/O CM
4 of 5 series · 15 of 48 positions shown · non-contrast
Comparison: Cervical spine MRI from the same day reported
separately. Lumbar MRI 12/20/2020. Thoracic spine radiographs
10/30/2020.

CLINICAL DATA: 80-year-old male with myelopathy. Neck and mid back
pain. Prior surgery.

EXAM:
MRI THORACIC SPINE WITHOUT CONTRAST
TECHNIQUE: Multiplanar, multisequence MR imaging of the thoracic spine was
performed. No intravenous contrast was administered.

[Series 4: STIR · sagittal · 3.0mm · 1.09mm/px · 3 of 21 slices shown]
[im 3/21]
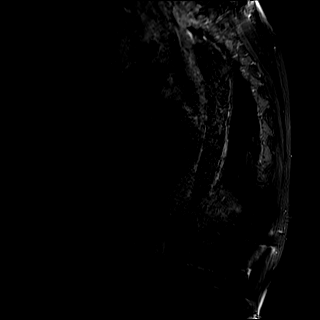
[im 12/21]
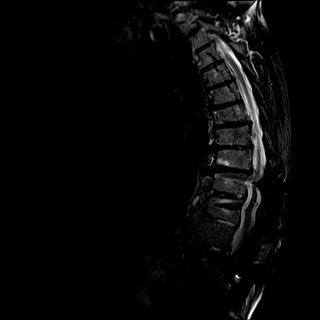
[im 18/21]
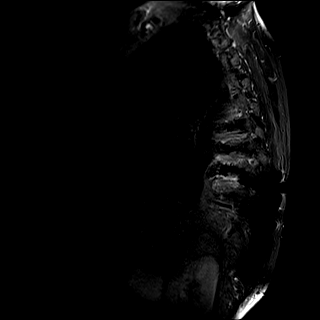

[Series 5: T2 post-contrast · sagittal · 3.0mm · 0.55mm/px · 6 of 21 slices shown]
[im 1/21]
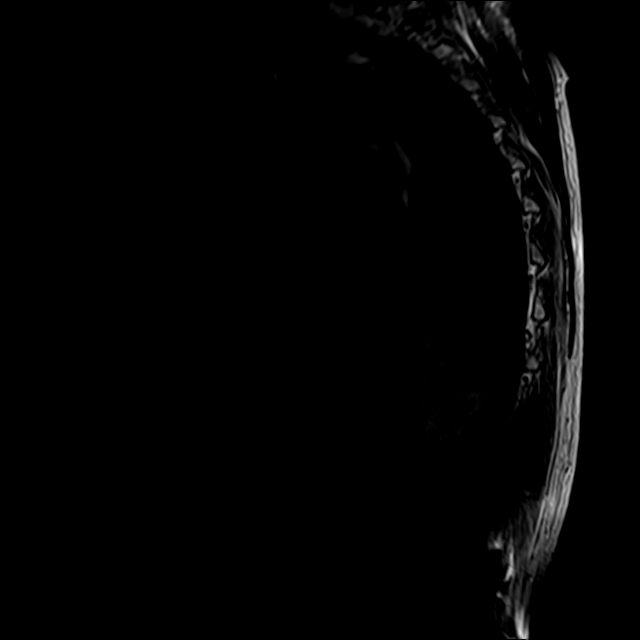
[im 4/21]
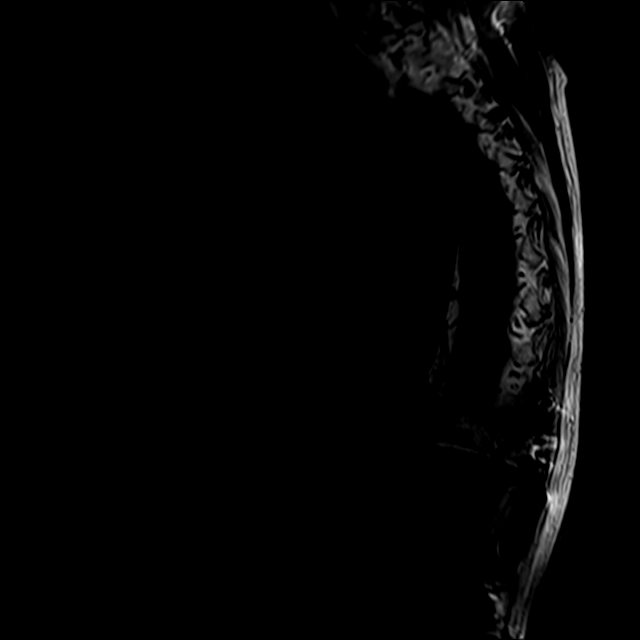
[im 7/21]
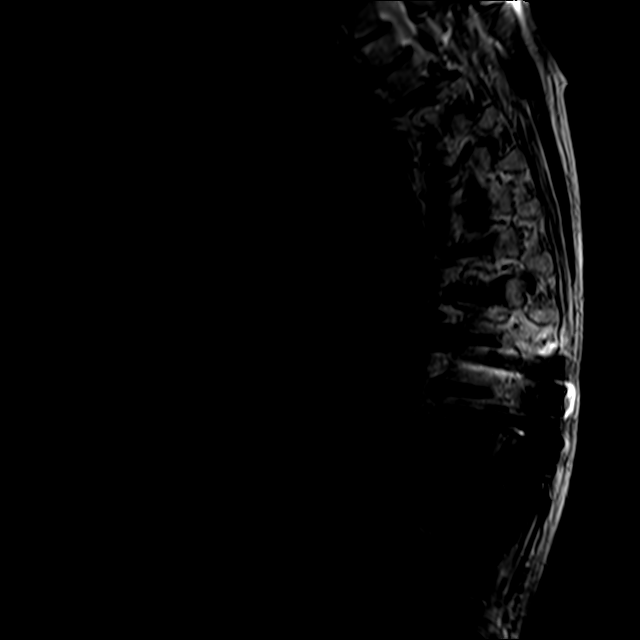
[im 11/21]
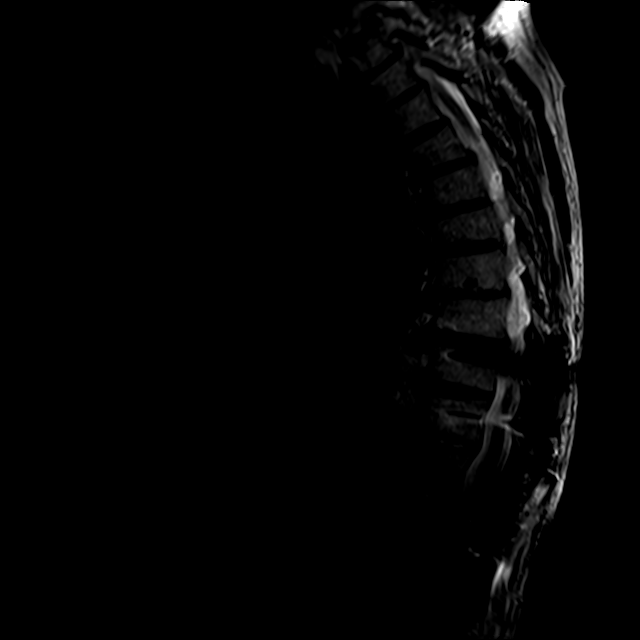
[im 14/21]
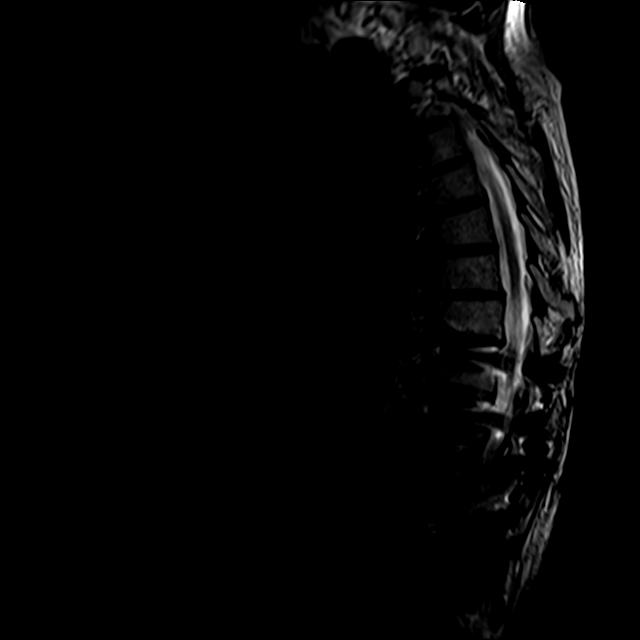
[im 17/21]
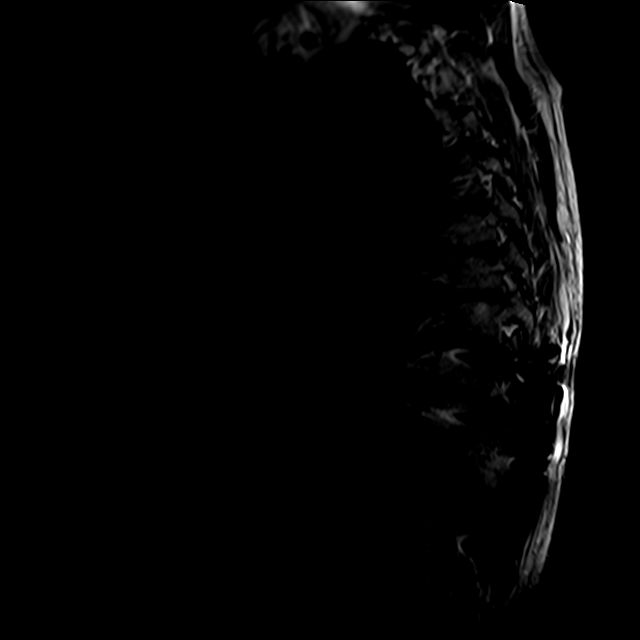

[Series 6: T1 · sagittal · 3.0mm · 0.55mm/px · 3 of 21 slices shown]
[im 4/21]
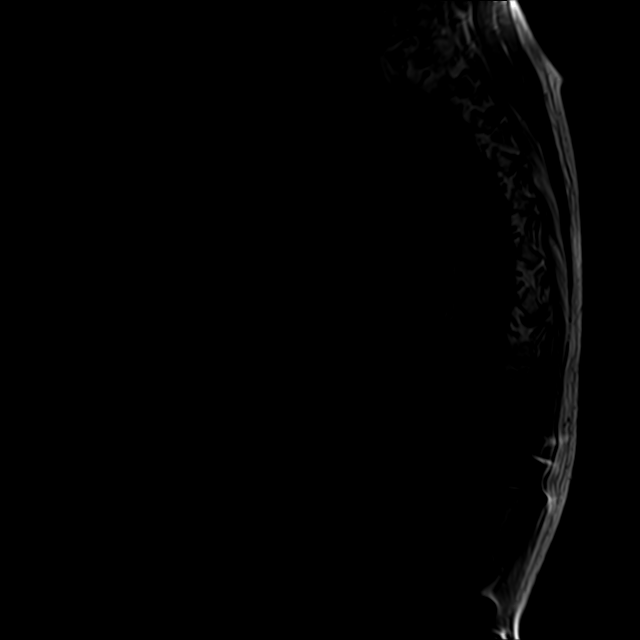
[im 11/21]
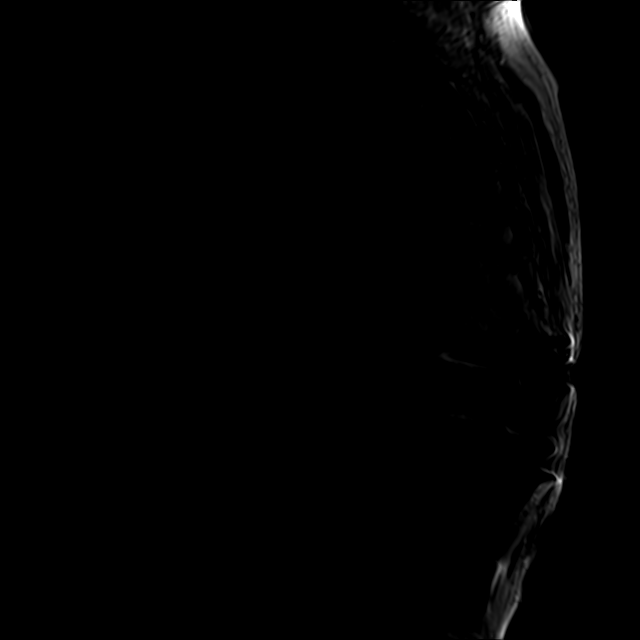
[im 17/21]
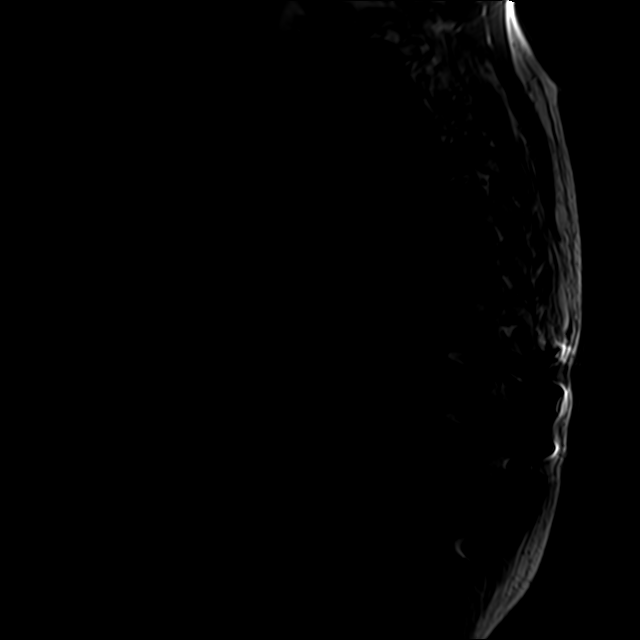

[Series 7: T2 · axial · 4.0mm · 0.39mm/px · z∈[-228,-107]mm · 3 of 36 slices shown]
[im 6/36]
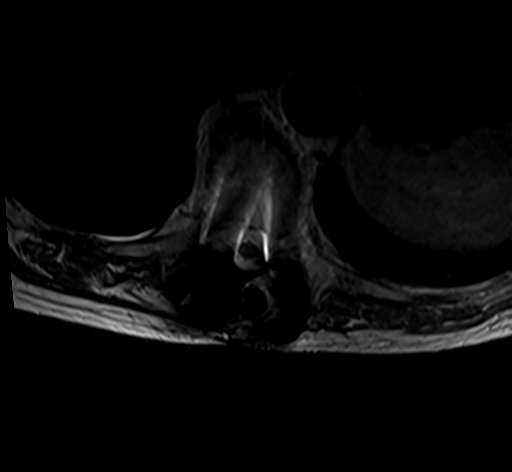
[im 18/36]
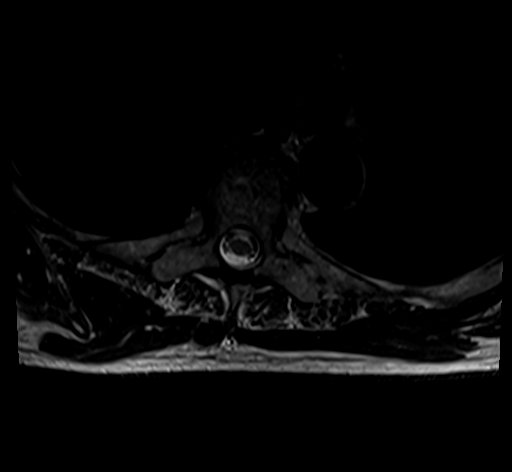
[im 30/36]
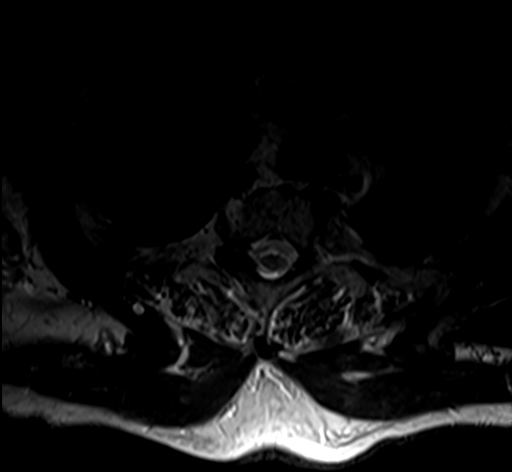

[15 of 48 positions shown; findings below may reference images not displayed]

FINDINGS: Limited cervical spine imaging:  Reported separately.

Thoracic spine segmentation: Appears to be normal. In this numbering
system is concordant with the [REDACTED] lumbar MRI.

Alignment: Preserved thoracic kyphosis with mild dextroconvex
thoracic scoliosis, stable from [facility] radiographs. Subtle
spondylolisthesis in the upper thoracic levels, anterolisthesis
T1-T2, T3-T4 and T4-T5 measuring about 2 mm. There is similar subtle
anterolisthesis of T8 on T9.

Vertebrae: Confluent degenerative appearing endplate marrow edema at
T1-T2 eccentric to the left (series 4, image 15). See additional
details of that level below.

Moderate marrow edema also at the T9 anterior inferior endplate.

Mild hardware susceptibility artifact beginning at T10 and
continuing through the upper lumbar spine. No other marrow edema or
acute osseous abnormality. A small T8 inferior endplate Schmorl's
node is chronic.

Cord: Lower thoracic spinal cord detail from T10 inferiorly is
limited by Kolocho susceptibility artifact. But no spinal cord signal
abnormality is identified. In the thoracic canal is capacious at
most levels.

Paraspinal and other soft tissues: Negative.

Disc levels:

T1-T2: Severe disc space loss with probable vacuum disc.
Circumferential disc osteophyte complex with foraminal involvement.
Mild to moderate facet and ligament flavum hypertrophy. But no
spinal stenosis. Moderate left and severe right T1 foraminal
stenosis.

T2-T3: Disc space loss with circumferential disc osteophyte complex,
bulky at the foramina. Mild to moderate facet and ligament flavum
hypertrophy. No spinal stenosis. Severe bilateral T2 foraminal
stenosis.

T3-T4: Mild disc space loss and disc bulging. Moderate facet and
ligament flavum hypertrophy. No spinal stenosis. Moderate to severe
bilateral T3 foraminal stenosis.

T4-T5: Relatively normal disc. Moderate facet and ligament flavum
hypertrophy. Moderate to severe T4 foraminal stenosis greater on the
left.

T5-T6: Mild disc space loss with circumferential disc bulge.
Moderate facet and ligament flavum hypertrophy. Moderate to severe
T5 foraminal stenosis.

T6-T7: Mild circumferential disc bulge with superimposed small right
paracentral disc protrusion (series 8, image 19). Mild facet and
ligament flavum hypertrophy here. No stenosis.

T7-T8: Circumferential disc bulge with broad-based left paracentral
component of disc on series 8, image 23. Moderate facet and ligament
flavum hypertrophy. No spinal stenosis. Moderate left greater than
right T7 foraminal stenosis.

T8-T9: Circumferential disc bulge. Moderate facet and ligament
flavum hypertrophy. Moderate to severe left and mild to moderate
right T8 foraminal stenosis.

T9-T10: Possible anterior vacuum disc. Circumferential disc
osteophyte complex. Moderate facet and ligament flavum hypertrophy.
But no convincing stenosis at this level.

T10-T11: Prior posterior fusion. Largely negative disc and no
stenosis.

T11-T12: Prior posterior fusion. Evidence of solid interbody
arthrodesis. No stenosis.

T12-L1: Prior fusion with evidence of solid arthrodesis. This level
was better demonstrated in [REDACTED], with no stenosis evident.
IMPRESSION: 1. Prior posterior fusion from T10 through the upper lumbar spine,
with evidence of solid arthrodesis and no lower thoracic stenosis.
2. Elsewhere the dominant thoracic degenerative finding is facet
arthropathy and ligament flavum hypertrophy, with multilevel mild
anterolisthesis. Occasional advanced disc degeneration (T1-T2 and
the T9-T10 adjacent segment). But no thoracic spinal stenosis. No
thoracic spinal cord abnormality.
3. There is moderate or severe thoracic neural foraminal stenosis at
the bilateral T1, T2, T3, T4, T5, and left T8 nerve levels.

## 2022-12-22 ENCOUNTER — Encounter: Payer: Self-pay | Admitting: Radiology

## 2022-12-26 ENCOUNTER — Ambulatory Visit (HOSPITAL_COMMUNITY): Payer: Medicare Other

## 2022-12-29 ENCOUNTER — Ambulatory Visit (HOSPITAL_COMMUNITY)
Admission: RE | Admit: 2022-12-29 | Discharge: 2022-12-29 | Disposition: A | Discharge status: Home / Self Care | Payer: Medicare Other | Source: Ambulatory Visit | Attending: Surgery | Admitting: Surgery

## 2022-12-29 ENCOUNTER — Other Ambulatory Visit (HOSPITAL_COMMUNITY): Payer: Self-pay | Admitting: Family Medicine

## 2022-12-29 DIAGNOSIS — I6521 Occlusion and stenosis of right carotid artery: Secondary | ICD-10-CM | POA: Insufficient documentation

## 2023-04-03 ENCOUNTER — Ambulatory Visit: Payer: Self-pay | Admitting: General Surgery

## 2023-04-03 NOTE — H&P (Signed)
Chief Complaint: New Consultation       History of Present Illness: Jesse Robbins is a 82 y.o. male who is seen today as an office consultation at the request of Dr. Duaine Dredge for evaluation of New Consultation .   Patient is an 82 year old male, with a history of prostate cancer, hypercholesterolemia, history of COPD, GI bleed, lumbar stenosis, carotid stenosis.  He comes in today secondary to a large left inguinal hernia as well as right inguinal hernia.  He states that he has noticed a left inguinal hernia been there for approximately 5+ years.  He states got larger.  He states that it has some pain discomfort to the area.  Patient is active usually around the house.   Patient's had previous radiation treatment to the prostate in 2003.  He is to have cryotherapy next week at [facility].   Patient had no previous abdominal surgery aside from an appendectomy at the age of 70.         Review of Systems: A complete review of systems was obtained from the patient.  I have reviewed this information and discussed as appropriate with the patient.  See HPI as well for other ROS.   Review of Systems  Constitutional:  Negative for fever.  HENT:  Negative for congestion.   Eyes:  Negative for blurred vision.  Respiratory:  Negative for cough, shortness of breath and wheezing.   Cardiovascular:  Negative for chest pain and palpitations.  Gastrointestinal:  Negative for heartburn.  Genitourinary:  Negative for dysuria.  Musculoskeletal:  Negative for myalgias.  Skin:  Negative for rash.  Neurological:  Negative for dizziness and headaches.  Psychiatric/Behavioral:  Negative for depression and suicidal ideas.   All other systems reviewed and are negative.       Medical History: Past Medical History Past Medical History: Diagnosis Date  Alcohol abuse    Anemia    Anesthesia complication     per patient report, he experienced cognitive decline following 04/2013 surgery.   Anxiety     COPD (chronic obstructive pulmonary disease) (CMS/HHS-HCC)    Depression    GERD (gastroesophageal reflux disease)    Hyperlipidemia    Hypertension    Osteoarthritis    Prostate cancer (CMS/HHS-HCC)    Transient alteration of awareness        Problem List Patient Active Problem List Diagnosis  Spinal stenosis, lumbar region, without neurogenic claudication  HTN (hypertension)  COPD (chronic obstructive pulmonary disease) (CMS/HHS-HCC)  GERD (gastroesophageal reflux disease)  H/O prostate cancer  Hx-TIA (transient ischemic attack)  Hip joint replacement status  PSA elevation  Hyperlipidemia  Preoperative evaluation to rule out surgical contraindication      Past Surgical History Past Surgical History: Procedure Laterality Date  JOINT REPLACEMENT Right 04/2013   THA  ARTHRODESIS POSTERIOR LUMBAR SPINE W/LAMINECTOMY/DISCECTOMY N/A 12/06/2013   Procedure: Airo, ARTHRODESIS POSTERIOR LUMBAR SPINE W/LAMINECTOMY/DISCECTOMY;  Surgeon: Fulton Mole, MD;  Location: [facility];  Service: Orthopedics;  Laterality: N/A;  APPLICATION VERTERBRAL DEFECT PROSTHETIC DEVICE N/A 12/06/2013   Procedure: APPLICATION VERTERBRAL DEFECT PROSTHETIC DEVICE;  Surgeon: Fulton Mole, MD;  Location: [facility];  Service: Orthopedics;  Laterality: N/A;  INSTRUMENTATION POSTERIOR SPINE 3 TO 6 VERTEBRAL SEGMENTS N/A 12/06/2013   Procedure: INSTRUMENTATION POSTERIOR SPINE 3 TO 6 VERTEBRAL SEGMENTS;  Surgeon: Fulton Mole, MD;  Location: [facility];  Service: Orthopedics;  Laterality: N/A;  INSERTION MORSELIZED BONE ALLOGRAFT FOR SPINE SURGERY N/A 12/06/2013   Procedure: INSERTION  MORSELIZED BONE ALLOGRAFT FOR SPINE SURGERY;  Surgeon: Fulton Mole, MD;  Location: [facility];  Service: Orthopedics;  Laterality: N/A;  AUTOGRAFT OBTAINED SAME INCISION FOR SPINE SURGERY N/A 12/06/2013   Procedure: AUTOGRAFT OBTAINED SAME INCISION FOR  SPINE SURGERY;  Surgeon: Fulton Mole, MD;  Location: [facility];  Service: Orthopedics;  Laterality: N/A;  DECOMP THORACIC SPINE BY TRANSPEDICULAR APPROACH N/A 12/06/2013   Procedure: DECOMP THORACIC SPINE BY TRANSPEDICULAR APPROACH;  Surgeon: Aletta Edouard, MD;  Location: [facility];  Service: Neurosurgery;  Laterality: N/A;  APPENDECTOMY      TONSILLECTOMY          Allergies Allergies Allergen Reactions  Dipyridamole Other (See Comments)     Patient cannot recall reaction, perhaps "mental fog"  Adhesive Rash      Medications Ordered Prior to Encounter Current Outpatient Medications on File Prior to Visit Medication Sig Dispense Refill  aflibercept (EYLEA) 2 mg/0.05 mL injection        albuterol 90 mcg/actuation inhaler Inhale 2 inhalations into the lungs as needed for Wheezing.      aspirin 81 MG EC tablet Take 81 mg by mouth once daily      clonazePAM (KLONOPIN) 1 MG tablet Take 0.5 mg by mouth every 6 (six) hours   5  dextroamphetamine sulfate 5 MG tablet Take 2.5 mg by mouth 3 (three) times daily      fluticasone (FLONASE) 50 mcg/actuation nasal spray     11  gabapentin (NEURONTIN) 600 MG tablet Take 600 mg by mouth 4 (four) times daily.       latanoprost (XALATAN) 0.005 % ophthalmic solution Place 1 drop into both eyes at bedtime      losartan (COZAAR) 100 MG tablet Take 100 mg by mouth once daily.    11  omeprazole (PRILOSEC) 20 MG DR capsule Take 20 mg by mouth 2 (two) times daily.    11  oxymetazoline (AFRIN) 0.05 % nasal spray Place into both nostrils 2 (two) times daily.      rosuvastatin (CRESTOR) 10 MG tablet Take 10 mg by mouth once daily      SYMBICORT 160-4.5 mcg/actuation inhaler Inhale 2 inhalations into the lungs 2 (two) times daily.    10  terazosin (HYTRIN) 2 MG capsule Take 8 mg by mouth nightly.    11  cholecalciferol (VITAMIN D3) 2,000 unit capsule Take 2,000 Units by mouth daily. (Patient not taking: Reported on  04/03/2023)      CYANOCOBALAMIN, VITAMIN B-12, (VITAMIN B-12 SL) Place under the tongue once daily. (Patient not taking: Reported on 04/03/2023)      FISH OIL/OMEGA-3/VIT C/VIT E (COROMEGA ORAL) Take 1 packet by mouth once daily (Patient not taking: Reported on 04/03/2023)      FUROsemide (LASIX) 20 MG tablet Take 20 mg by mouth once daily.  (Patient not taking: Reported on 11/15/2021)   5  LACTOBACILLUS ACIDOPHILUS (PROBIOTIC ORAL) Take by mouth 2 (two) times daily. (Patient not taking: Reported on 02/06/2023)      mirtazapine (REMERON) 15 MG tablet Take 15 mg by mouth at bedtime (Patient not taking: Reported on 02/06/2023)      multivitamin with minerals, EYE, (PRESERVISION AREDS 2) soft gel capsule Take 1 capsule by mouth 2 (two) times daily with meals (Patient not taking: Reported on 04/03/2023)      soft lens rinse,store solution Soln by Nasal route as needed. (Patient not taking: Reported on 04/03/2023)      VITAMIN B COMPLEX (B-COMPLEX  ORAL) Take 50 mg by mouth daily. (Patient not taking: Reported on 02/06/2023)        No current facility-administered medications on file prior to visit.      Family History Family History Problem Relation Age of Onset  Lung cancer Mother    Colon cancer Mother    Diabetes type II Mother    Coronary Artery Disease (Blocked arteries around heart) Mother    Peripheral vascular disease Mother    Stroke Father    Prostate cancer Father    Cancer Father         bladder  Heart disease Brother    Anesthesia problems Neg Hx        Tobacco Use History Social History    Tobacco Use Smoking Status Former  Current packs/day: 0.50  Average packs/day: 0.5 packs/day for 40.0 years (20.0 ttl pk-yrs)  Types: Cigarettes  Passive exposure: Past Smokeless Tobacco Never Tobacco Comments   currently smoking marijuana      Social History Social History    Socioeconomic History  Marital status: Married Tobacco Use  Smoking status: Former     Current  packs/day: 0.50     Average packs/day: 0.5 packs/day for 40.0 years (20.0 ttl pk-yrs)     Types: Cigarettes     Passive exposure: Past  Smokeless tobacco: Never  Tobacco comments:     currently smoking marijuana Vaping Use  Vaping status: Never Used Substance and Sexual Activity  Alcohol use: Not Currently  Drug use: Yes     Types: Marijuana     Comment: Daily Marijuana use- last used 2 hrs ago  Sexual activity: Never      Objective:     Vitals:   04/03/23 1425 BP: (!) 147/62 Pulse: 74 Temp: 36.7 C (98 F) SpO2: 97% Weight: 67.8 kg (149 lb 6.4 oz) Height: 167.6 cm (5\' 6" ) PainSc:   2 PainLoc: Abdomen   Body mass index is 24.11 kg/m. Physical Exam Constitutional:      Appearance: Normal appearance.  HENT:     Head: Normocephalic and atraumatic.     Nose: Nose normal. No congestion.     Mouth/Throat:     Mouth: Mucous membranes are moist.     Pharynx: Oropharynx is clear.  Eyes:     Pupils: Pupils are equal, round, and reactive to light.  Cardiovascular:     Rate and Rhythm: Normal rate and regular rhythm.     Pulses: Normal pulses.     Heart sounds: Normal heart sounds. No murmur heard.    No friction rub. No gallop.  Pulmonary:     Effort: Pulmonary effort is normal. No respiratory distress.     Breath sounds: Normal breath sounds. No stridor. No wheezing, rhonchi or rales.  Abdominal:     General: Abdomen is flat.     Hernia: A hernia is present. Hernia is present in the left inguinal area and right inguinal area.     Comments: L>R  Musculoskeletal:        General: Normal range of motion.     Cervical back: Normal range of motion.  Skin:    General: Skin is warm and dry.  Neurological:     General: No focal deficit present.     Mental Status: He is alert and oriented to person, place, and time.  Psychiatric:        Mood and Affect: Mood normal.        Thought Content: Thought content normal.  Assessment and Plan: Diagnoses and all  orders for this visit:   Bilateral inguinal hernia without obstruction or gangrene, recurrence not specified     DEMONTAY BUFALINI is a 83 y.o. male    Patient with a history of prostate cancer, large left greater than right inguinal hernia.  Patient will have a stage inguinal hernia repair with proceeding with a left-sided open hernia repair first followed by the right open inguinal hernia.     1.  We will proceed to the OR for a open left inguinal hernia repair with mesh. 2. All risks and benefits were discussed with the patient, to generally include infection, bleeding, damage to surrounding structures, acute and chronic nerve pain, and recurrence. Alternatives were offered and described.  All questions were answered and the patient voiced understanding of the procedure and wishes to proceed at this point.             No follow-ups on file.   Axel Filler, MD, [facility], [facility]

## 2023-05-04 ENCOUNTER — Other Ambulatory Visit (HOSPITAL_COMMUNITY): Payer: Medicare Other

## 2023-05-11 ENCOUNTER — Encounter: Payer: Self-pay | Admitting: Physician Assistant

## 2023-05-11 ENCOUNTER — Ambulatory Visit: Payer: Medicare Other | Admitting: Physician Assistant

## 2023-05-11 ENCOUNTER — Ambulatory Visit: Admit: 2023-05-11 | Payer: Medicare Other | Admitting: General Surgery

## 2023-05-11 DIAGNOSIS — M1712 Unilateral primary osteoarthritis, left knee: Secondary | ICD-10-CM

## 2023-05-11 SURGERY — REPAIR, HERNIA, INGUINAL, ADULT
Anesthesia: General | Laterality: Left

## 2023-05-11 MED ORDER — METHYLPREDNISOLONE ACETATE 40 MG/ML IJ SUSP
60.0000 mg | INTRAMUSCULAR | Status: AC | PRN
  Administered 2023-05-11: 60 mg via INTRA_ARTICULAR

## 2023-05-11 MED ORDER — LIDOCAINE HCL 1 % IJ SOLN
3.0000 mL | INTRAMUSCULAR | Status: AC | PRN
  Administered 2023-05-11: 3 mL

## 2023-05-11 MED ORDER — BUPIVACAINE HCL 0.25 % IJ SOLN
2.0000 mL | INTRAMUSCULAR | Status: AC | PRN
  Administered 2023-05-11: 2 mL via INTRA_ARTICULAR

## 2023-05-11 NOTE — Progress Notes (Signed)
Office Visit Note   Patient: Jesse Robbins           Date of Birth: 03/14/1941           MRN: [id number] Visit Date: 05/11/2023              Requested by: Jesse Putt, MD [address] [place],  [place] [address] PCP: Jesse Putt, MD  Chief Complaint  Patient presents with  . Left Knee - Pain      HPI: [name] is a pleasant 82 year old gentleman with a history of osteoarthritis of his left knee he is a patient of Jesse Robbins and Jesse Robbins.  Has had Monovisc injections as well as aspirations and steroid injections.  Has not been seen in over a year.  He has had a return of some fluid and pain to his left knee requesting aspiration rates his pain is moderate  Assessment & Plan: Visit Diagnoses:  1. Unilateral primary osteoarthritis, left knee     Plan: Jesse Robbins forward with an aspiration today of approximately 28 cc of blood-tinged fluid.  No evidence of any purulence.  60 cc of methylprednisolone was injected without difficulty may follow-up as needed  Follow-Up Instructions: Return if symptoms worsen or fail to improve.   Ortho Exam  Patient is alert, oriented, no adenopathy, well-dressed, normal affect, normal respiratory effort. Left knee no erythema no warmth he has an obvious moderate effusion compartments are soft and nontender he is neurovascular intact  Imaging: No results found. No images are attached to the encounter.  Labs: Lab Results  Component Value Date   HGBA1C  06/23/2009    5.3 (NOTE) The ADA recommends the following therapeutic goal for glycemic control related to Hgb A1c measurement: Goal of therapy: <6.5 Hgb A1c  Reference: American Diabetes Association: Clinical Practice Recommendations 2010, Diabetes Care, 2010, 33: (Suppl  1).     Lab Results  Component Value Date   ALBUMIN 4.2 04/15/2013   ALBUMIN 3.5 08/26/2010   ALBUMIN 3.8 06/23/2009    Lab Results  Component Value Date   MG 2.6 (H) 06/21/2009   MG 2.3 05/05/2009   No  results found for: "VD25OH"  No results found for: "PREALBUMIN"    Latest Ref Rng & Units 07/07/2020   10:18 AM 04/18/2013    6:00 AM 04/17/2013    6:34 AM  CBC EXTENDED  WBC 4.0 - 10.5 K/uL 5.8  7.2  6.6   RBC 4.22 - 5.81 Mil/uL 4.10  3.26  3.65   Hemoglobin 13.0 - 17.0 g/dL 19.1  47.8  29.5   HCT 39.0 - 52.0 % 38.8  31.1  34.5   Platelets 150.0 - 400.0 K/uL 255.0  197  213   NEUT# 1.4 - 7.7 K/uL 3.1     Lymph# 0.7 - 4.0 K/uL 2.0        There is no height or weight on file to calculate BMI.  Orders:  No orders of the defined types were placed in this encounter.  No orders of the defined types were placed in this encounter.    Procedures: Large Joint Inj: L knee on 05/11/2023 3:20 PM Indications: pain and diagnostic evaluation Details: 25 G 1.5 in needle, superolateral approach  Arthrogram: No  Medications: 60 mg methylPREDNISolone acetate 40 MG/ML; 3 mL lidocaine 1 %; 2 mL bupivacaine 0.25 % Aspirate: 27 mL blood-tinged Outcome: tolerated well, no immediate complications  After obtaining verbal consent and sterilely prepping the superior lateral aspect  of his knee 3 cc of lidocaine plain was injected.  After adequate analgesia obtained.  18-gauge needle was inserted.  27 cc of blood-tinged clear fluid was aspirated.  Following this 2 cc of bupivacaine and 1 cc of 80 mg Depo-Medrol was injected.  Patient tolerated the procedure well Band-Aid and Ace wrap were applied Procedure, treatment alternatives, risks and benefits explained, specific risks discussed. Consent was given by the patient.    Clinical Data: No additional findings.  ROS:  All other systems negative, except as noted in the HPI. Review of Systems  Objective: Vital Signs: There were no vitals taken for this visit.  Specialty Comments:  No specialty comments available.  PMFS History: Patient Active Problem List   Diagnosis Date Noted  . Unilateral primary osteoarthritis, left knee 05/11/2023  . Fusion  of spine, thoracolumbar region 01/13/2021  . Abnormal gait 01/13/2021  . PSA elevation 11/01/2018  . Continuous chronic alcoholism (HCC) 12/05/2013  . CAFL (chronic airflow limitation) (HCC) 12/05/2013  . Acid reflux 12/05/2013  . H/O malignant neoplasm of prostate 12/05/2013  . History of repair of hip joint 12/05/2013  . H/O transient cerebral ischemia 12/05/2013  . Chronic obstructive pulmonary disease (HCC) 12/05/2013  . Idiopathic scoliosis and kyphoscoliosis 08/21/2013  . Lumbar canal stenosis 08/21/2013  . 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HCC) 04/16/2013  . Pre-op evaluation 04/06/2013  . Essential hypertension 04/06/2013  . Dyslipidemia 04/06/2013   Past Medical History:  Diagnosis Date  . Alcohol abuse   . Anemia   . Anxiety   . Arthritis   . Cervical spinal stenosis   . Chronic gastritis   . COPD (chronic obstructive pulmonary disease) (HCC)   . Depression   . Diverticulosis   . Headache(784.0)    migraine  . HTN (hypertension)   . Hyperlipidemia   . Lumbar spinal stenosis   . 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  . Stroke [facility])    2010 TIA  . TIA (transient ischemic attack) 06/2009    Family History  Problem Relation Age of Onset  . Colon cancer Mother   . Lung cancer Mother   . Stroke Father   . Prostate cancer Father   . Cancer Maternal Grandfather        lung  . Heart attack Brother   . Hypertension Brother        hyperlipidemia  . Esophageal cancer Neg Hx     Past Surgical History:  Procedure Laterality Date  . APPENDECTOMY  1952  . COLONOSCOPY     dr Jesse Robbins around 2017  . INSERTION PROSTATE RADIATION SEED    . RHINOPLASTY    . SPINE SURGERY    . TONSILLECTOMY  1950  . TOTAL HIP ARTHROPLASTY Right 04/16/2013   Procedure: RIGHT TOTAL HIP ARTHROPLASTY ANTERIOR APPROACH;  Surgeon: Jesse Hitch, MD;  Location: [facility] OR;  Service: Orthopedics;  Laterality: Right;   Social History   Occupational History  .  Not on file  Tobacco Use  . Smoking status: Former    Current packs/day: 0.00    Average packs/day: 1 pack/day for 25.0 years (25.0 ttl pk-yrs)    Types: Cigarettes    Start date: 04/05/1973    Quit date: 04/05/1998    Years since quitting: 25.1  . Smokeless tobacco: Never  Vaping Use  . Vaping status: Never Used  Substance and Sexual Activity  . Alcohol use: Yes    Alcohol/week: 7.0 standard drinks  of alcohol    Types: 7 Shots of liquor per week    Comment: 6 drinks every day; stopped as of 10/2020  . Drug use: Yes    Types: Marijuana    Comment: less then 1/2 gm a day  . Sexual activity: Not on file

## 2023-06-01 ENCOUNTER — Encounter: Payer: Self-pay | Admitting: Physician Assistant

## 2023-06-01 ENCOUNTER — Ambulatory Visit (INDEPENDENT_AMBULATORY_CARE_PROVIDER_SITE_OTHER): Payer: Medicare Other | Admitting: Physician Assistant

## 2023-06-01 DIAGNOSIS — G8929 Other chronic pain: Secondary | ICD-10-CM

## 2023-06-01 DIAGNOSIS — M25562 Pain in left knee: Secondary | ICD-10-CM | POA: Diagnosis not present

## 2023-06-01 MED ORDER — LIDOCAINE HCL 1 % IJ SOLN
4.0000 mL | INTRAMUSCULAR | Status: AC | PRN
Start: 2023-06-01 — End: 2023-06-01
  Administered 2023-06-01: 4 mL

## 2023-06-01 NOTE — Progress Notes (Signed)
Office Visit Note   Patient: Jesse Robbins           Date of Birth: 17-Mar-1941           MRN: [id number] Visit Date: 06/01/2023              Requested by: Mosetta Putt, MD [address] [place],  [place] [address] PCP: Mosetta Putt, MD  Chief Complaint  Patient presents with   Left Knee - Pain      HPI: Patient is a pleasant 82 year old gentleman who is a patient of Dr. Eliberto Ivory.  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  He came in and saw me approximately 3 weeks ago at which time 28 cc of fluid was drawn off his knee and he was given 60 cc of methylprednisolone.  He comes in today no new injury but wondering if he could have another aspiration.  Rates his pain as mild to moderate  Assessment & Plan: Visit Diagnoses: Osteoarthritis left knee  Plan: Will go forward with an aspiration today.  Should at some point revisit with Dr. Magnus Ivan if this does not work.  We also discussed gel injections  Follow-Up Instructions: No follow-ups on file.   Ortho Exam  Patient is alert, oriented, no adenopathy, well-dressed, normal affect, normal respiratory effort Left knee he has a mild to moderate effusion no erythema no cellulitis compartments are soft and nontender neurovascular intact has significant grinding with range of motion  Labs: Lab Results  Component Value Date   HGBA1C  06/23/2009    5.3 (NOTE) The ADA recommends the following therapeutic goal for glycemic control related to Hgb A1c measurement: Goal of therapy: <6.5 Hgb A1c  Reference: American Diabetes Association: Clinical Practice Recommendations 2010, Diabetes Care, 2010, 33: (Suppl  1).     Lab Results  Component Value Date   ALBUMIN 4.2 04/15/2013   ALBUMIN 3.5 08/26/2010   ALBUMIN 3.8 06/23/2009    Lab Results  Component Value Date   MG 2.6 (H) 06/21/2009   MG 2.3 05/05/2009   No results found for: "VD25OH"  No results found for: "PREALBUMIN"    Latest Ref Rng & Units 07/07/2020    10:18 AM 04/18/2013    6:00 AM 04/17/2013    6:34 AM  CBC EXTENDED  WBC 4.0 - 10.5 K/uL 5.8  7.2  6.6   RBC 4.22 - 5.81 Mil/uL 4.10  3.26  3.65   Hemoglobin 13.0 - 17.0 g/dL 54.2  70.6  23.7   HCT 39.0 - 52.0 % 38.8  31.1  34.5   Platelets 150.0 - 400.0 K/uL 255.0  197  213   NEUT# 1.4 - 7.7 K/uL 3.1     Lymph# 0.7 - 4.0 K/uL 2.0        There is no height or weight on file to calculate BMI.  Orders:  No orders of the defined types were placed in this encounter.  No orders of the defined types were placed in this encounter.    Procedures: Large Joint Inj on 06/01/2023 1:59 PM Indications: pain and diagnostic evaluation Details: 25 G 1.5 in needle, superolateral approach  Arthrogram: No  Medications: 4 mL lidocaine 1 % Outcome: tolerated well, no immediate complications  After obtaining verbal consent the superior lateral pouch was prepped with alcohol and Betadine x 2.  25-gauge needle was injected with lidocaine.  After adequate analgesia was achieved area was reprepped with Betadine.  An 18-gauge needle on a  35 cc syringe was inserted in the suprapatella pouch.  34 cc of blood-tinged fluid was aspirated.  Band-Aid and Ace wrap were applied he self ambulated from the clinic Procedure, treatment alternatives, risks and benefits explained, specific risks discussed. Consent was given by the patient. Immediately prior to procedure a time out was called to verify the correct patient, procedure, equipment, support staff and site/side marked as required. Patient was prepped and draped in the usual sterile fashion.     Clinical Data: No additional findings.  ROS:  All other systems negative, except as noted in the HPI. Review of Systems  Objective: Vital Signs: There were no vitals taken for this visit.  Specialty Comments:  No specialty comments available.  PMFS History: Patient Active Problem List   Diagnosis Date Noted   Unilateral primary osteoarthritis, left knee  05/11/2023   Fusion of spine, thoracolumbar region 01/13/2021   Abnormal gait 01/13/2021   PSA elevation 11/01/2018   Continuous chronic alcoholism (HCC) 12/05/2013   CAFL (chronic airflow limitation) (HCC) 12/05/2013   Acid reflux 12/05/2013   H/O malignant neoplasm of prostate 12/05/2013   History of repair of hip joint 12/05/2013   H/O transient cerebral ischemia 12/05/2013   Chronic obstructive pulmonary disease (HCC) 12/05/2013   Idiopathic scoliosis and kyphoscoliosis 08/21/2013   Lumbar canal stenosis 08/21/2013   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HCC) 04/16/2013   Pre-op evaluation 04/06/2013   Essential hypertension 04/06/2013   Dyslipidemia 04/06/2013   Past Medical History:  Diagnosis Date   Alcohol abuse    Anemia    Anxiety    Arthritis    Cervical spinal stenosis    Chronic gastritis    COPD (chronic obstructive pulmonary disease) (HCC)    Depression    Diverticulosis    Headache(784.0)    migraine   HTN (hypertension)    Hyperlipidemia    Lumbar spinal stenosis    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   Stroke (HCC)    2010 TIA   TIA (transient ischemic attack) 06/2009    Family History  Problem Relation Age of Onset   Colon cancer Mother    Lung cancer Mother    Stroke Father    Prostate cancer Father    Cancer Maternal Grandfather        lung   Heart attack Brother    Hypertension Brother        hyperlipidemia   Esophageal cancer Neg Hx     Past Surgical History:  Procedure Laterality Date   APPENDECTOMY  1952   COLONOSCOPY     dr Kinnie Scales around 2017   INSERTION PROSTATE RADIATION SEED     RHINOPLASTY     SPINE SURGERY     TONSILLECTOMY  1950   TOTAL HIP ARTHROPLASTY Right 04/16/2013   Procedure: RIGHT TOTAL HIP ARTHROPLASTY ANTERIOR APPROACH;  Surgeon: Kathryne Hitch, MD;  Location: [facility] OR;  Service: Orthopedics;  Laterality: Right;   Social History   Occupational History   Not on file  Tobacco Use    Smoking status: Former    Current packs/day: 0.00    Average packs/day: 1 pack/day for 25.0 years (25.0 ttl pk-yrs)    Types: Cigarettes    Start date: 04/05/1973    Quit date: 04/05/1998    Years since quitting: 25.1   Smokeless tobacco: Never  Vaping Use   Vaping status: Never Used  Substance and Sexual Activity   Alcohol use: Yes  Alcohol/week: 7.0 standard drinks of alcohol    Types: 7 Shots of liquor per week    Comment: 6 drinks every day; stopped as of 10/2020   Drug use: Yes    Types: Marijuana    Comment: less then 1/2 gm a day   Sexual activity: Not on file

## 2023-06-21 ENCOUNTER — Other Ambulatory Visit: Payer: Self-pay | Admitting: Family Medicine

## 2023-06-21 ENCOUNTER — Ambulatory Visit
Admission: RE | Admit: 2023-06-21 | Discharge: 2023-06-21 | Disposition: A | Discharge status: Home / Self Care | Payer: Medicare Other | Source: Ambulatory Visit | Attending: Family Medicine | Admitting: Family Medicine

## 2023-06-21 DIAGNOSIS — R072 Precordial pain: Secondary | ICD-10-CM

## 2023-06-21 DIAGNOSIS — M546 Pain in thoracic spine: Secondary | ICD-10-CM

## 2023-07-04 ENCOUNTER — Encounter (HOSPITAL_COMMUNITY): Payer: Self-pay | Admitting: Family Medicine

## 2023-07-04 ENCOUNTER — Other Ambulatory Visit (HOSPITAL_COMMUNITY): Payer: Self-pay | Admitting: Family Medicine

## 2023-07-04 DIAGNOSIS — M546 Pain in thoracic spine: Secondary | ICD-10-CM

## 2023-07-04 DIAGNOSIS — M4804 Spinal stenosis, thoracic region: Secondary | ICD-10-CM

## 2023-07-04 DIAGNOSIS — M48061 Spinal stenosis, lumbar region without neurogenic claudication: Secondary | ICD-10-CM

## 2023-07-11 ENCOUNTER — Ambulatory Visit (HOSPITAL_COMMUNITY)
Admission: RE | Admit: 2023-07-11 | Discharge: 2023-07-11 | Disposition: A | Discharge status: Home / Self Care | Payer: Medicare Other | Source: Ambulatory Visit | Attending: Family Medicine | Admitting: Family Medicine

## 2023-07-11 ENCOUNTER — Ambulatory Visit (HOSPITAL_COMMUNITY): Payer: Medicare Other

## 2023-07-11 DIAGNOSIS — M4804 Spinal stenosis, thoracic region: Secondary | ICD-10-CM | POA: Insufficient documentation

## 2023-07-11 DIAGNOSIS — M48061 Spinal stenosis, lumbar region without neurogenic claudication: Secondary | ICD-10-CM | POA: Diagnosis present

## 2023-07-11 DIAGNOSIS — M546 Pain in thoracic spine: Secondary | ICD-10-CM | POA: Insufficient documentation

## 2023-12-01 ENCOUNTER — Other Ambulatory Visit: Payer: Self-pay | Admitting: Family Medicine

## 2023-12-01 ENCOUNTER — Ambulatory Visit
Admission: RE | Admit: 2023-12-01 | Discharge: 2023-12-01 | Disposition: A | Discharge status: Home / Self Care | Payer: Medicare Other | Source: Ambulatory Visit | Attending: Family Medicine | Admitting: Family Medicine

## 2023-12-01 DIAGNOSIS — M25511 Pain in right shoulder: Secondary | ICD-10-CM

## 2023-12-27 ENCOUNTER — Other Ambulatory Visit (INDEPENDENT_AMBULATORY_CARE_PROVIDER_SITE_OTHER): Payer: Self-pay

## 2023-12-27 ENCOUNTER — Encounter: Payer: Self-pay | Admitting: Physician Assistant

## 2023-12-27 ENCOUNTER — Ambulatory Visit (INDEPENDENT_AMBULATORY_CARE_PROVIDER_SITE_OTHER): Admitting: Physician Assistant

## 2023-12-27 DIAGNOSIS — M25562 Pain in left knee: Secondary | ICD-10-CM

## 2023-12-27 DIAGNOSIS — M1712 Unilateral primary osteoarthritis, left knee: Secondary | ICD-10-CM | POA: Diagnosis not present

## 2023-12-27 DIAGNOSIS — G8929 Other chronic pain: Secondary | ICD-10-CM

## 2023-12-27 MED ORDER — LIDOCAINE HCL 1 % IJ SOLN
4.0000 mL | INTRAMUSCULAR | Status: AC | PRN
Start: 2023-12-27 — End: 2023-12-27
  Administered 2023-12-27: 4 mL

## 2023-12-27 MED ORDER — METHYLPREDNISOLONE ACETATE 40 MG/ML IJ SUSP
40.0000 mg | INTRAMUSCULAR | Status: AC | PRN
Start: 2023-12-27 — End: 2023-12-27
  Administered 2023-12-27: 40 mg via INTRA_ARTICULAR

## 2023-12-27 NOTE — Progress Notes (Signed)
 Office Visit Note   Patient: Jesse Robbins           Date of Birth: 30-Nov-1940           MRN: [id number] Visit Date: 12/27/2023              Requested by: Mosetta Putt, MD [address] [place],  [place] [address] PCP: Mosetta Putt, MD   Assessment & Plan: Visit Diagnoses:  1. Chronic pain of left knee     Plan: Will work on getting viscosupplementation for his left knee.  Have him back once this is available.  Questions were encouraged and answered length.  Follow-Up Instructions: Return for Supplemental injection.   Orders:  Orders Placed This Encounter  Procedures   Large Joint Inj   XR Knee 1-2 Views Left   No orders of the defined types were placed in this encounter.     Procedures: Large Joint Inj: L knee on 12/27/2023 1:11 PM Indications: pain Details: 22 G 1.5 in needle, anterolateral approach  Arthrogram: No  Medications: 4 mL lidocaine 1 %; 40 mg methylPREDNISolone acetate 40 MG/ML Aspirate: 33 mL yellow and blood-tinged Outcome: tolerated well, no immediate complications Procedure, treatment alternatives, risks and benefits explained, specific risks discussed. Consent was given by the patient. Immediately prior to procedure a time out was called to verify the correct patient, procedure, equipment, support staff and site/side marked as required. Patient was prepped and draped in the usual sterile fashion.       Clinical Data: No additional findings.   Subjective: Chief Complaint  Patient presents with   Left Knee - Pain    HPI Jesse Robbins comes in today wanting his left knee aspirated again also wanted to know what else can be done for his knee pain.  He was last seen for his knee in August 2024 was given a cortisone injection last fall and knee was aspirated.  He states that the cortisone injection at that time gave him minimal relief.  He states he is dealing with back pain and is now on oxycodone to treat the pain.  In regards to his knees  had no new injury.  Does feel that the knee is swollen.  Denies any fevers chills.  He is nondiabetic.  Review of Systems See HPI otherwise negative or noncontributory  Objective: Vital Signs: There were no vitals taken for this visit.  Physical Exam Constitutional:      Appearance: He is normal weight. He is not ill-appearing or diaphoretic.  Pulmonary:     Effort: Pulmonary effort is normal.  Neurological:     Mental Status: He is alert and oriented to person, place, and time.  Psychiatric:        Mood and Affect: Mood normal.     Ortho Exam Bilateral knees: No abnormal warmth or erythema.  Positive effusion left knee only.  No gross instability valgus varus stress in either knee.  Left knee lacks proxy 7 degrees full extension.  Good flexion both knees.  Left knee considerable patellofemoral crepitus with range of motion.  Specialty Comments:  No specialty comments available.  Imaging: XR Knee 1-2 Views Left Result Date: 12/27/2023 Left knee 2 views: Shows further advancement of the lateral compartmental arthritis now near bone-on-bone advancement of the patellofemoral arthritis which is severe at this point in time.  Medial joint line overall well-preserved.  No acute fractures or acute findings.    PMFS History: Patient Active Problem List   Diagnosis  Date Noted   Unilateral primary osteoarthritis, left knee 05/11/2023   Fusion of spine, thoracolumbar region 01/13/2021   Abnormal gait 01/13/2021   PSA elevation 11/01/2018   Continuous chronic alcoholism (HCC) 12/05/2013   CAFL (chronic airflow limitation) (HCC) 12/05/2013   Acid reflux 12/05/2013   H/O malignant neoplasm of prostate 12/05/2013   History of repair of hip joint 12/05/2013   H/O transient cerebral ischemia 12/05/2013   Chronic obstructive pulmonary disease (HCC) 12/05/2013   Idiopathic scoliosis and kyphoscoliosis 08/21/2013   Lumbar canal stenosis 08/21/2013   Avascular necrosis of bone of right hip  (HCC) 04/16/2013   Pre-op evaluation 04/06/2013   Essential hypertension 04/06/2013   Dyslipidemia 04/06/2013   Past Medical History:  Diagnosis Date   Alcohol abuse    Anemia    Anxiety    Arthritis    Cervical spinal stenosis    Chronic gastritis    COPD (chronic obstructive pulmonary disease) (HCC)    Depression    Diverticulosis    Headache(784.0)    migraine   HTN (hypertension)    Hyperlipidemia    Lumbar spinal stenosis    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   Stroke (HCC)    2010 TIA   TIA (transient ischemic attack) 06/2009    Family History  Problem Relation Age of Onset   Colon cancer Mother    Lung cancer Mother    Stroke Father    Prostate cancer Father    Cancer Maternal Grandfather        lung   Heart attack Brother    Hypertension Brother        hyperlipidemia   Esophageal cancer Neg Hx     Past Surgical History:  Procedure Laterality Date   APPENDECTOMY  1952   COLONOSCOPY     dr Kinnie Scales around 2017   INSERTION PROSTATE RADIATION SEED     RHINOPLASTY     SPINE SURGERY     TONSILLECTOMY  1950   TOTAL HIP ARTHROPLASTY Right 04/16/2013   Procedure: RIGHT TOTAL HIP ARTHROPLASTY ANTERIOR APPROACH;  Surgeon: Kathryne Hitch, MD;  Location: [facility] OR;  Service: Orthopedics;  Laterality: Right;   Social History   Occupational History   Not on file  Tobacco Use   Smoking status: Former    Current packs/day: 0.00    Average packs/day: 1 pack/day for 25.0 years (25.0 ttl pk-yrs)    Types: Cigarettes    Start date: 04/05/1973    Quit date: 04/05/1998    Years since quitting: 25.7   Smokeless tobacco: Never  Vaping Use   Vaping status: Never Used  Substance and Sexual Activity   Alcohol use: Yes    Alcohol/week: 7.0 standard drinks of alcohol    Types: 7 Shots of liquor per week    Comment: 6 drinks every day; stopped as of 10/2020   Drug use: Yes    Types: Marijuana    Comment: less then 1/2 gm a day   Sexual  activity: Not on file

## 2023-12-28 ENCOUNTER — Telehealth: Payer: Self-pay

## 2023-12-28 NOTE — Telephone Encounter (Signed)
 Left knee gel injection ?

## 2024-01-02 ENCOUNTER — Other Ambulatory Visit: Payer: Self-pay | Admitting: Family Medicine

## 2024-01-02 ENCOUNTER — Ambulatory Visit
Admission: RE | Admit: 2024-01-02 | Discharge: 2024-01-02 | Disposition: A | Discharge status: Home / Self Care | Source: Ambulatory Visit | Attending: Family Medicine | Admitting: Family Medicine

## 2024-01-02 DIAGNOSIS — R0602 Shortness of breath: Secondary | ICD-10-CM

## 2024-01-05 NOTE — Telephone Encounter (Signed)
 I called patient and LMVM advising he is ok to be scheduled for Monovisc left knee.  Buy and bill, no copay.  If patient calls back he is ok to be scheduled.

## 2024-01-08 NOTE — Telephone Encounter (Signed)
 Noted.

## 2024-01-11 ENCOUNTER — Other Ambulatory Visit: Payer: Self-pay

## 2024-01-11 DIAGNOSIS — M1712 Unilateral primary osteoarthritis, left knee: Secondary | ICD-10-CM

## 2024-01-22 ENCOUNTER — Ambulatory Visit: Admitting: Physician Assistant

## 2024-01-22 DIAGNOSIS — M1712 Unilateral primary osteoarthritis, left knee: Secondary | ICD-10-CM | POA: Diagnosis not present

## 2024-01-22 MED ORDER — HYALURONAN 88 MG/4ML IX SOSY
88.0000 mg | PREFILLED_SYRINGE | INTRA_ARTICULAR | Status: AC | PRN
Start: 2024-01-22 — End: 2024-01-22
  Administered 2024-01-22: 88 mg via INTRA_ARTICULAR

## 2024-01-22 MED ORDER — LIDOCAINE HCL 1 % IJ SOLN
3.0000 mL | INTRAMUSCULAR | Status: AC | PRN
Start: 2024-01-22 — End: 2024-01-22
  Administered 2024-01-22: 3 mL

## 2024-01-22 NOTE — Progress Notes (Signed)
   Procedure Note  Patient: Jesse Robbins             Date of Birth: 11/29/1940           MRN: [id number]             Visit Date: 01/22/2024 HPI: Mr. Mcartor comes in today for scheduled Monovisc injection left knee.  He notes that the left knee cortisone injection on 12/27/2023 was helpful.  It still giving some relief but slowly wearing off.  He has had no new injury to the left knee.  He has no known surgery scheduled on the left knee in the next 6 months.  He has known osteoarthritis left knee.    Physical exam: Left knee good range of motion.  Positive effusion no abnormal warmth erythema no instability valgus varus stressing.  Procedures: Visit Diagnoses:  1. Unilateral primary osteoarthritis, left knee     Large Joint Inj: L knee on 01/22/2024 4:08 PM Indications: pain Details: 22 G 1.5 in needle, superolateral approach  Arthrogram: No  Medications: 88 mg Hyaluronan 88 MG/4ML; 3 mL lidocaine 1 % Outcome: tolerated well, no immediate complications Procedure, treatment alternatives, risks and benefits explained, specific risks discussed. Consent was given by the patient. Immediately prior to procedure a time out was called to verify the correct patient, procedure, equipment, support staff and site/side marked as required. Patient was prepped and draped in the usual sterile fashion.     Plan: He knows to wait 6 months between viscosupplementation injections.  Questions were encouraged and answered at length.

## 2024-03-01 ENCOUNTER — Emergency Department (HOSPITAL_COMMUNITY)

## 2024-03-01 ENCOUNTER — Emergency Department (HOSPITAL_COMMUNITY)
Admission: EM | Admit: 2024-03-01 | Discharge: 2024-03-01 | Disposition: A | Discharge status: Home / Self Care | Attending: Emergency Medicine | Admitting: Emergency Medicine

## 2024-03-01 ENCOUNTER — Other Ambulatory Visit: Payer: Self-pay

## 2024-03-01 DIAGNOSIS — Z79899 Other long term (current) drug therapy: Secondary | ICD-10-CM | POA: Insufficient documentation

## 2024-03-01 DIAGNOSIS — Z8673 Personal history of transient ischemic attack (TIA), and cerebral infarction without residual deficits: Secondary | ICD-10-CM | POA: Diagnosis not present

## 2024-03-01 DIAGNOSIS — Z7902 Long term (current) use of antithrombotics/antiplatelets: Secondary | ICD-10-CM | POA: Diagnosis not present

## 2024-03-01 DIAGNOSIS — Z8546 Personal history of malignant neoplasm of prostate: Secondary | ICD-10-CM | POA: Diagnosis not present

## 2024-03-01 DIAGNOSIS — R531 Weakness: Secondary | ICD-10-CM | POA: Diagnosis present

## 2024-03-01 DIAGNOSIS — I10 Essential (primary) hypertension: Secondary | ICD-10-CM | POA: Diagnosis not present

## 2024-03-01 DIAGNOSIS — Z7982 Long term (current) use of aspirin: Secondary | ICD-10-CM | POA: Diagnosis not present

## 2024-03-01 LAB — I-STAT CHEM 8, ED
BUN: 34 mg/dL — ABNORMAL HIGH (ref 8–23)
Calcium, Ion: 1.16 mmol/L (ref 1.15–1.40)
Chloride: 103 mmol/L (ref 98–111)
Creatinine, Ser: 1 mg/dL (ref 0.61–1.24)
Glucose, Bld: 106 mg/dL — ABNORMAL HIGH (ref 70–99)
HCT: 39 % (ref 39.0–52.0)
Hemoglobin: 13.3 g/dL (ref 13.0–17.0)
Potassium: 3.9 mmol/L (ref 3.5–5.1)
Sodium: 137 mmol/L (ref 135–145)
TCO2: 23 mmol/L (ref 22–32)

## 2024-03-01 LAB — DIFFERENTIAL
Abs Immature Granulocytes: 0.02 10*3/uL (ref 0.00–0.07)
Basophils Absolute: 0.1 10*3/uL (ref 0.0–0.1)
Basophils Relative: 1 %
Eosinophils Absolute: 0.2 10*3/uL (ref 0.0–0.5)
Eosinophils Relative: 3 %
Immature Granulocytes: 0 %
Lymphocytes Relative: 28 %
Lymphs Abs: 2 10*3/uL (ref 0.7–4.0)
Monocytes Absolute: 0.6 10*3/uL (ref 0.1–1.0)
Monocytes Relative: 8 %
Neutro Abs: 4.3 10*3/uL (ref 1.7–7.7)
Neutrophils Relative %: 60 %

## 2024-03-01 LAB — CBC
HCT: 39.1 % (ref 39.0–52.0)
Hemoglobin: 12.9 g/dL — ABNORMAL LOW (ref 13.0–17.0)
MCH: 30.6 pg (ref 26.0–34.0)
MCHC: 33 g/dL (ref 30.0–36.0)
MCV: 92.9 fL (ref 80.0–100.0)
Platelets: 227 10*3/uL (ref 150–400)
RBC: 4.21 MIL/uL — ABNORMAL LOW (ref 4.22–5.81)
RDW: 13.9 % (ref 11.5–15.5)
WBC: 7.2 10*3/uL (ref 4.0–10.5)
nRBC: 0 % (ref 0.0–0.2)

## 2024-03-01 LAB — COMPREHENSIVE METABOLIC PANEL WITH GFR
ALT: 47 U/L — ABNORMAL HIGH (ref 0–44)
AST: 48 U/L — ABNORMAL HIGH (ref 15–41)
Albumin: 4.1 g/dL (ref 3.5–5.0)
Alkaline Phosphatase: 53 U/L (ref 38–126)
Anion gap: 11 (ref 5–15)
BUN: 31 mg/dL — ABNORMAL HIGH (ref 8–23)
CO2: 26 mmol/L (ref 22–32)
Calcium: 9.6 mg/dL (ref 8.9–10.3)
Chloride: 102 mmol/L (ref 98–111)
Creatinine, Ser: 0.97 mg/dL (ref 0.61–1.24)
GFR, Estimated: >60 mL/min (ref >60)
Glucose, Bld: 109 mg/dL — ABNORMAL HIGH (ref 70–99)
Potassium: 4 mmol/L (ref 3.5–5.1)
Sodium: 139 mmol/L (ref 135–145)
Total Bilirubin: 0.8 mg/dL (ref 0.0–1.2)
Total Protein: 6.8 g/dL (ref 6.5–8.1)

## 2024-03-01 LAB — PROTIME-INR
INR: 1 (ref 0.8–1.2)
Prothrombin Time: 13.7 s (ref 11.4–15.2)

## 2024-03-01 LAB — ETHANOL: Alcohol, Ethyl (B): <15 mg/dL (ref <15)

## 2024-03-01 LAB — APTT: aPTT: 30 s (ref 24–36)

## 2024-03-01 LAB — CBG MONITORING, ED: Glucose-Capillary: 117 mg/dL — ABNORMAL HIGH (ref 70–99)

## 2024-03-01 MED ORDER — CLOPIDOGREL BISULFATE 75 MG PO TABS: 75.0000 mg | ORAL_TABLET | Freq: Every day | ORAL | 0 refills | Status: AC

## 2024-03-01 MED ORDER — CLOPIDOGREL BISULFATE 75 MG PO TABS: 75.0000 mg | ORAL_TABLET | Freq: Once | ORAL | Status: DC

## 2024-03-01 NOTE — ED Triage Notes (Signed)
 Patient arrives for eval of L hand incoordination and possible weakness. Onset of this was 1100 yesterday.

## 2024-03-01 NOTE — Discharge Instructions (Signed)
 Take Plavix daily as prescribed for 3 weeks.  Follow-up with neurology and primary care doctor.  Return if symptoms worsen.

## 2024-03-01 NOTE — ED Notes (Signed)
 Patient stated he did not wish to stay for the plavix and would see his discharge paperwork on mychart.

## 2024-03-01 NOTE — ED Provider Triage Note (Signed)
 Emergency Medicine Provider Triage Evaluation Note  KIENAN PLUNKETT , a 83 y.o. male  was evaluated in triage.  Pt complains of weakness in his left hand since yesterday.  Patient states his fingers do not work right.  Patient thinks he had a stroke.  Review of Systems  Positive: Weakness left hand Negative: VAN negative  Physical Exam  BP (!) 175/89 (BP Location: Left Arm)   Pulse 86   Temp 97.8 F (36.6 C)   Resp 18   SpO2 92%  Gen:   Awake, no distress   Resp:  Normal effort  MSK:   Moves extremities without difficulty decreased grip left hand Other:    Medical Decision Making  Medically screening exam initiated at 2:12 PM.  Appropriate orders placed.  Arlin Benes was informed that the remainder of the evaluation will be completed by another provider, this initial triage assessment does not replace that evaluation, and the importance of remaining in the ED until their evaluation is complete.     Sandi Crosby, PA-C 03/01/24 1413

## 2024-03-01 NOTE — ED Provider Notes (Signed)
 [name] Provider Note   CSN: [id number] Arrival date & time: 03/01/24  1216     History  Chief Complaint  Patient presents with   Hand Problem    Jesse Robbins is a 83 y.o. male.  Patient here with left hand weakness that started yesterday at 11 AM.  History of prior TIA.  Still admits to some weakness in the left hand but somewhat improved since yesterday.  Denies any other symptoms.  Denies any neck pain headache.  History of TIA hypertension prostate cancer in remission, alcohol abuse.  Denies any new issues neurologically.  He has some chronic back pain and has a hard time walking at times but he does not appreciate any weakness or numbness speech changes or vision changes otherwise.  Maybe had brief confusion earlier yesterday when this for started but that is resolved.  The history is provided by the patient.       Home Medications Prior to Admission medications   Medication Sig Start Date End Date Taking? Authorizing Provider  clopidogrel (PLAVIX) 75 MG tablet Take 1 tablet (75 mg total) by mouth daily for 21 days. 03/01/24 03/22/24 Yes Mekaela Azizi, DO  Acetaminophen  (TYLENOL  PO) Take 3 tablets by mouth 2 (two) times daily.    [provider]  albuterol (PROVENTIL HFA;VENTOLIN HFA) 108 (90 Base) MCG/ACT inhaler Inhale 2 puffs into the lungs every 4 (four) hours as needed.     [provider]  ascorbic acid (VITAMIN C) 500 MG tablet Take 500 mg by mouth daily.    [provider]  ASPIRIN  81 PO Take 1 tablet by mouth at bedtime.    [provider]  b complex vitamins capsule Take 1 capsule by mouth daily.     [provider]  budesonide-formoterol (SYMBICORT) 160-4.5 MCG/ACT inhaler Inhale 2 puffs into the lungs 2 (two) times daily.  07/09/13   [provider]  cholecalciferol (VITAMIN D) 1000 UNITS tablet Take 4,000 Units by mouth daily.    [provider]   clonazePAM (KLONOPIN) 0.5 MG tablet TAKE 1 TABLET BY MOUTH THREE TIMES DAILY AS NEEDED. MAKE APPOINTMENT NOW 12/19/20   [provider]  Cyanocobalamin (VITAMIN B-12) 5000 MCG TBDP Take 1 tablet by mouth daily.    [provider]  dextroamphetamine (DEXTROSTAT) 5 MG tablet Take 5 mg by mouth 3 (three) times daily. 10/28/20   [provider]  fluticasone (FLONASE) 50 MCG/ACT nasal spray Place 2 sprays into the nose daily.    [provider]  latanoprost (XALATAN) 0.005 % ophthalmic solution Place 1 drop into both eyes at bedtime.    [provider]  losartan  (COZAAR ) 100 MG tablet Take 100 mg by mouth daily. 03/15/13   [provider]  magnesium oxide (MAG-OX) 400 MG tablet Take 400 mg by mouth daily.    [provider]  NASAL SALINE NA Place 2 sprays into both nostrils as needed (sinus).     [provider]  Omega-3 Fatty Acids (FISH OIL PO) Take 1 tablet by mouth at bedtime.    [provider]  omeprazole  (PRILOSEC) 20 MG capsule Take 20 mg by mouth daily.    [provider]  oxymetazoline (AFRIN) 0.05 % nasal spray Place 2 sprays into the nose daily as needed.     [provider]  terazosin  (HYTRIN ) 2 MG capsule Take 2 mg by mouth. 4 tablets at night 03/19/13   [provider]  Allergies    Penicillins, Persantine [dipyridamole], Tramadol, and Hydrocodone    Review of Systems   Review of Systems  Physical Exam Updated Vital Signs BP (!) 170/80   Pulse 64   Temp 97.9 F (36.6 C) (Oral)   Resp 14   SpO2 95%  Physical Exam Vitals and nursing note reviewed.  Constitutional:      General: He is not in acute distress.    Appearance: He is well-developed. He is not ill-appearing.  HENT:     Head: Normocephalic and atraumatic.     Nose: Nose normal.     Mouth/Throat:     Mouth: Mucous membranes are moist.  Eyes:     Extraocular Movements: Extraocular movements intact.      Conjunctiva/sclera: Conjunctivae normal.     Pupils: Pupils are equal, round, and reactive to light.  Cardiovascular:     Rate and Rhythm: Normal rate and regular rhythm.     Pulses: Normal pulses.     Heart sounds: Normal heart sounds. No murmur heard. Pulmonary:     Effort: Pulmonary effort is normal. No respiratory distress.     Breath sounds: Normal breath sounds.  Abdominal:     General: Abdomen is flat.     Palpations: Abdomen is soft.     Tenderness: There is no abdominal tenderness.  Musculoskeletal:        General: No swelling.     Cervical back: Normal range of motion and neck supple.  Skin:    General: Skin is warm and dry.     Capillary Refill: Capillary refill takes less than 2 seconds.  Neurological:     General: No focal deficit present.     Mental Status: He is alert and oriented to person, place, and time.     Cranial Nerves: No cranial nerve deficit.     Sensory: No sensory deficit.     Coordination: Coordination normal.     Comments: Weakness in the left hand compared to the right hand but otherwise normal strength throughout 5+ out of 5, normal sensation, no drift, normal speech, normal visual fields, normal coordination  Psychiatric:        Mood and Affect: Mood normal.     ED Results / Procedures / Treatments   Labs (all labs ordered are listed, but only abnormal results are displayed) Labs Reviewed  CBC - Abnormal; Notable for the following components:      Result Value   RBC 4.21 (*)    Hemoglobin 12.9 (*)    All other components within normal limits  COMPREHENSIVE METABOLIC PANEL WITH GFR - Abnormal; Notable for the following components:   Glucose, Bld 109 (*)    BUN 31 (*)    AST 48 (*)    ALT 47 (*)    All other components within normal limits  I-STAT CHEM 8, ED - Abnormal; Notable for the following components:   BUN 34 (*)    Glucose, Bld 106 (*)    All other components within normal limits  CBG MONITORING, ED - Abnormal; Notable for the  following components:   Glucose-Capillary 117 (*)    All other components within normal limits  PROTIME-INR  APTT  DIFFERENTIAL  ETHANOL    EKG EKG Interpretation Date/Time:  Friday Mar 01 2024 19:52:46 EDT Ventricular Rate:  122 PR Interval:  496 QRS Duration:  102 QT Interval:  486 QTC Calculation: 692 R Axis:   66  Text Interpretation: with 1st degree A-V block  with Premature supraventricular complexes and with frequent Premature ventricular complexes Low voltage QRS When compared with ECG of 29-Sep-2010 21:28, PREVIOUS ECG IS PRESENT Confirmed by Lowery Rue [phone]) on 03/01/2024 8:51:02 PM  Radiology MR BRAIN WO CONTRAST Result Date: 03/01/2024 CLINICAL DATA:  Neuro deficit, acute, stroke suspected left hand weaknessw EXAM: MRI HEAD WITHOUT CONTRAST TECHNIQUE: Multiplanar, multiecho pulse sequences of the brain and surrounding structures were obtained without intravenous contrast. COMPARISON:  CT head from earlier today. FINDINGS: Brain: No acute infarction, hemorrhage, hydrocephalus, extra-axial collection or mass lesion. Moderate to advanced T2/FLAIR hyperintensities the white matter, compatible with chronic microvascular ischemic change. Cerebral atrophy. Vascular: Major arterial flow voids are maintained at the skull base. Skull and upper cervical spine: Normal marrow signal. Sinuses/Orbits: Mostly clear sinuses.  No acute orbital findings. Other: No mastoid effusions. IMPRESSION: 1. No evidence of acute intracranial abnormality. 2. Moderate to advanced chronic microvascular ischemic change. Electronically Signed   By: Stevenson Elbe M.D.   On: 03/01/2024 20:42   CT HEAD WO CONTRAST Result Date: 03/01/2024 CLINICAL DATA:  Neuro deficit concern for stroke. EXAM: CT HEAD WITHOUT CONTRAST TECHNIQUE: Contiguous axial images were obtained from the base of the skull through the vertex without intravenous contrast. RADIATION DOSE REDUCTION: This exam was performed according to the  departmental dose-optimization program which includes automated exposure control, adjustment of the mA and/or kV according to patient size and/or use of iterative reconstruction technique. COMPARISON:  MRI head 06/23/2009. FINDINGS: Brain: No acute intracranial hemorrhage. No CT evidence of acute infarct. Nonspecific hypoattenuation in the periventricular and subcortical white matter favored to reflect chronic microvascular ischemic changes. Remote lacunar infarct in the right basal ganglia. No edema, mass effect, or midline shift. The basilar cisterns are patent. Ventricles: The ventricles are normal. Vascular: Atherosclerotic calcifications of the carotid siphons and intracranial vertebral arteries. No hyperdense vessel. Skull: No acute or aggressive finding. Orbits: Orbits are symmetric. Sinuses: Mucosal thickening in the bilateral ethmoid sinuses extending into the inferior aspect of the frontal sinuses. No air-fluid levels. Other: Mastoid air cells are clear. Chronic appearing bilateral nasal bone deformities. IMPRESSION: No CT evidence of acute intracranial abnormality. Extensive chronic microvascular ischemic changes. Remote lacunar infarct in the right basal ganglia. Electronically Signed   By: Denny Flack M.D.   On: 03/01/2024 15:03    Procedures Procedures    Medications Ordered in ED Medications  clopidogrel (PLAVIX) tablet 75 mg (75 mg Oral Not Given 03/01/24 2204)    ED Course/ Medical Decision Making/ A&P                                 Medical Decision Making Amount and/or Complexity of Data Reviewed Labs: ordered. Radiology: ordered.  Risk Prescription drug management.   Arlin Benes is here with left hand weakness.  Mildly elevated blood pressure but otherwise unremarkable vitals.  Differential diagnosis could be stroke versus may be a peripheral process.  Does not complain of any major neck pain or numbness tingling down the arms.  Does not seem to be cervical process at  this time.  He has noticed weakness since 11 AM yesterday somewhat improved but still with some weakness on exam.  No other symptoms.  He has a history of TIA history of substance abuse history of chronic pain prostate cancer in remission.  Ultimately he is had labs and CT head done that are unremarkable per my review interpretation.  He has  remote lacunar infarct on the right.  Ultimately we will get an MRI of the brain to see if there is any acute process and will discuss with neurology.  He still mildly weak for me on exam which he states is new so not sure if this will qualify for as a TIA.  Overall MRI negative for stroke.  Talked with Dr. Alecia Ames with neurology.  Given that he still has some mild weakness in his hand seems less likely to be a stroke/TIA.  Overall though we will start him on Plavix 75 mg daily for 3 weeks having follow-up with neurology and primary care doctor to complete TIA workup.  May be due to EMG/peripheral workup.  Is not having any neck pain now appreciate any major weakness or numbness on exam at this time.  But he might need cervical workup, peripheral nerve workup.  He understands return precautions.  Discharged in good condition.  This chart was dictated using voice recognition software.  Despite best efforts to proofread,  errors can occur which can change the documentation meaning.         Final Clinical Impression(s) / ED Diagnoses Final diagnoses:  Weakness    Rx / DC Orders ED Discharge Orders          Ordered    Ambulatory referral to Neurology       Comments: An appointment is requested in approximately: 1 week   03/01/24 2206    clopidogrel (PLAVIX) 75 MG tablet  Daily        03/01/24 2206              Lowery Rue, DO 03/01/24 2208

## 2024-04-25 ENCOUNTER — Ambulatory Visit: Admitting: Physician Assistant

## 2024-04-25 DIAGNOSIS — M1712 Unilateral primary osteoarthritis, left knee: Secondary | ICD-10-CM

## 2024-04-25 MED ORDER — METHYLPREDNISOLONE ACETATE 40 MG/ML IJ SUSP
40.0000 mg | INTRAMUSCULAR | Status: AC | PRN
  Administered 2024-04-25: 40 mg via INTRA_ARTICULAR

## 2024-04-25 MED ORDER — LIDOCAINE HCL 1 % IJ SOLN
3.0000 mL | INTRAMUSCULAR | Status: AC | PRN
  Administered 2024-04-25: 3 mL

## 2024-04-25 NOTE — Progress Notes (Signed)
   Procedure Note  Patient: Jesse Robbins             Date of Birth: September 20, 1941           MRN: [id number]             Visit Date: 04/25/2024 HPI: Mr. Fouch is well-known Dr. Jacob service comes in today requesting aspiration of his left knee.  He underwent viscosupplementation injection in the knee on 01/22/2024.  He has had no new injury.  He notes he is having some swelling in the knee.  Known osteoarthritis.  Review of systems: Denies any fevers chills.  Physical exam: General Well-developed well-nourished male who walks with  kyphotic posture with no assistive device. Left knee: Good range of motion of left knee.  No abnormal warmth erythema.  Positive effusion.   Procedures: Visit Diagnoses:  1. Unilateral primary osteoarthritis, left knee     Large Joint Inj: L knee on 04/25/2024 5:25 PM Indications: pain Details: 22 G 1.5 in needle, anterolateral approach  Arthrogram: No  Medications: 3 mL lidocaine  1 %; 40 mg methylPREDNISolone  acetate 40 MG/ML Aspirate: 24 mL yellow Outcome: tolerated well, no immediate complications Procedure, treatment alternatives, risks and benefits explained, specific risks discussed. Consent was given by the patient. Immediately prior to procedure a time out was called to verify the correct patient, procedure, equipment, support staff and site/side marked as required. Patient was prepped and draped in the usual sterile fashion.    Plan: He knows to wait at least 3 months between cortisone injections and 6 months between viscosupplementation injections.  He will follow-up as needed.

## 2024-05-22 ENCOUNTER — Telehealth: Payer: Self-pay | Admitting: Neurology

## 2024-05-22 NOTE — Telephone Encounter (Signed)
 Patient's wife called to reschedule appointment due to scheduling conflict with dental appointment

## 2024-05-23 ENCOUNTER — Ambulatory Visit: Admitting: Neurology

## 2024-07-22 ENCOUNTER — Ambulatory Visit: Admitting: Physician Assistant

## 2024-07-22 DIAGNOSIS — M1712 Unilateral primary osteoarthritis, left knee: Secondary | ICD-10-CM | POA: Diagnosis not present

## 2024-07-22 MED ORDER — LIDOCAINE HCL 1 % IJ SOLN
3.0000 mL | INTRAMUSCULAR | Status: AC | PRN
  Administered 2024-07-22: 3 mL

## 2024-07-22 MED ORDER — METHYLPREDNISOLONE ACETATE 40 MG/ML IJ SUSP
40.0000 mg | INTRAMUSCULAR | Status: AC | PRN
  Administered 2024-07-22: 40 mg via INTRA_ARTICULAR

## 2024-07-22 NOTE — Progress Notes (Signed)
   Procedure Note  Patient: RAMARI BRAY             Date of Birth: 1941-01-18           MRN: [id number]             Visit Date: 07/22/2024 HPI: Mr. Schurman comes in today requesting left knee aspiration injection.  Last cortisone injection was given on 06/02/2024 gave him good relief until recently.  No new injury.  He states he has had no change in his overall health status.  Patient is nondiabetic.  No fevers chills  Physical exam: Left knee: No abnormal warmth or erythema.  Positive effusion.  Passive range of motion reveals crepitus patellofemoral region.  No gross instability.  Overall good range of motion.   Procedures: Visit Diagnoses:  1. Unilateral primary osteoarthritis, left knee     Large Joint Inj: L knee on 07/22/2024 1:58 PM Indications: pain Details: 22 G 1.5 in needle, anterolateral approach  Arthrogram: No  Medications: 3 mL lidocaine  1 %; 40 mg methylPREDNISolone  acetate 40 MG/ML Aspirate: 35 mL yellow and blood-tinged Outcome: tolerated well, no immediate complications Procedure, treatment alternatives, risks and benefits explained, specific risks discussed. Consent was given by the patient. Immediately prior to procedure a time out was called to verify the correct patient, procedure, equipment, support staff and site/side marked as required. Patient was prepped and draped in the usual sterile fashion.     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

## 2024-08-16 ENCOUNTER — Encounter: Payer: Self-pay | Admitting: Gastroenterology

## 2024-08-16 ENCOUNTER — Other Ambulatory Visit: Payer: Self-pay

## 2024-08-16 DIAGNOSIS — M1712 Unilateral primary osteoarthritis, left knee: Secondary | ICD-10-CM

## 2024-08-19 ENCOUNTER — Encounter: Payer: Self-pay | Admitting: Radiology

## 2024-08-29 ENCOUNTER — Encounter: Payer: Self-pay | Admitting: Neurology

## 2024-08-29 ENCOUNTER — Other Ambulatory Visit: Payer: Self-pay

## 2024-08-29 ENCOUNTER — Ambulatory Visit (INDEPENDENT_AMBULATORY_CARE_PROVIDER_SITE_OTHER): Admitting: Neurology

## 2024-08-29 VITALS — BP 109/58

## 2024-08-29 DIAGNOSIS — Z8673 Personal history of transient ischemic attack (TIA), and cerebral infarction without residual deficits: Secondary | ICD-10-CM

## 2024-08-29 DIAGNOSIS — R29898 Other symptoms and signs involving the musculoskeletal system: Secondary | ICD-10-CM

## 2024-08-29 DIAGNOSIS — I2585 Chronic coronary microvascular dysfunction: Secondary | ICD-10-CM

## 2024-08-29 DIAGNOSIS — G3184 Mild cognitive impairment, so stated: Secondary | ICD-10-CM

## 2024-08-29 DIAGNOSIS — R413 Other amnesia: Secondary | ICD-10-CM

## 2024-08-29 DIAGNOSIS — I639 Cerebral infarction, unspecified: Secondary | ICD-10-CM | POA: Diagnosis not present

## 2024-08-29 NOTE — Progress Notes (Addendum)
 [facility] [address] [place]. [place] [address] [phone]       OFFICE CONSULT NOTE  Mr. Jesse Robbins Date of Birth:  [birth year] Medical Record Number:  [id number]   Referring MD: Maude Sprague  Reason for Referral: Hand weakness  HPI: Mr. Jesse Robbins is a 83 year old pleasant Caucasian male seen today for initial office consultation visit.  He is accompanied by his wife.  History is obtained from them and review of electronic medical records.  I personally reviewed pertinent available imaging films in PACS.  He has past medical history of hypertension, hyperlipidemia, kyphoscoliosis, lumbar spinal stenosis, COPD arthritis and anxiety and prostate cancer.  Remote history of TIA in 2010.  Patient states he developed sudden onset of left hand weakness on 03/01/2024.  He had trouble using his hand.  He also appeared to be confused and had a headache.  He was seen in the ER the next day however he still has some residual weakness in the hand but improving.  CT head showed no acute abnormality but a small remote lacunar infarct on the right MRI scan of the brain showed no acute infarct.  There were changes of small vessel disease but the lacunar infarct mentioned on CT scan was not seen..  Patient states that his hand weakness eventually recovered completely but it took almost a month.  He has had no recurrent stroke or TIA symptoms since then.  He is on Plavix  which is tolerating well with minor bruising and no bleeding.  He is independent in actives of daily living.  He still driving.  He is now gotten lost.  He does complain of gradual memory difficulty over the years which appear to be progressing.  He is still handling all his own affairs.  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  He has quit smoking several years ago.  On cognitive testing today scored 25/30.  Lab work on 11/22/2023 showed LDL cholesterol to be 91 mg percent and hemoglobin A1c to be 5.4.  Carotid  ultrasound on 12/29/2022 showed 40-59% right ICA and 1-39% left ICA stenosis.  ROS:   14 system review of systems is positive for memory difficulties, difficulty walking, poor balance, bruising, decreased hearing and all other systems negative  PMH:  Past Medical History:  Diagnosis Date   Alcohol abuse    Anemia    Anxiety    Arthritis    Cervical spinal stenosis    Chronic gastritis    COPD (chronic obstructive pulmonary disease) (HCC)    Depression    Diverticulosis    Headache(784.0)    migraine   HTN (hypertension)    Hyperlipidemia    Lumbar spinal stenosis    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   Stroke (HCC)    2010 TIA   TIA (transient ischemic attack) 06/2009    Social History:  Social History   Socioeconomic History   Marital status: Married    Spouse name: Not on file   Number of children: Not on file   Years of education: Not on file   Highest education level: Not on file  Occupational History   Not on file  Tobacco Use   Smoking status: Former    Current packs/day: 0.00    Average packs/day: 1 pack/day for 25.0 years (25.0 ttl pk-yrs)    Types: Cigarettes    Start date: 04/05/1973    Quit date: 04/05/1998    Years since  quitting: 26.4   Smokeless tobacco: Never  Vaping Use   Vaping status: Never Used  Substance and Sexual Activity   Alcohol use: Yes    Alcohol/week: 7.0 standard drinks of alcohol    Types: 7 Shots of liquor per week    Comment: 6 drinks every day; stopped as of 10/2020   Drug use: Yes    Types: Marijuana    Comment: less then 1/2 gm a day   Sexual activity: Not on file  Other Topics Concern   Not on file  Social History Narrative   Not on file   Social Drivers of Health   Financial Resource Strain: Low Risk  (08/16/2023)   Received from [facility]   Overall Financial Resource Strain (CARDIA)    Difficulty of Paying Living Expenses: Not hard at all  Food Insecurity: No Food  Insecurity (08/16/2023)   Received from [facility]   Hunger Vital Sign    Within the past 12 months, you worried that your food would run out before you got the money to buy more.: Never true    Within the past 12 months, the food you bought just didn't last and you didn't have money to get more.: Never true  Transportation Needs: No Transportation Needs (08/16/2023)   Received from [facility] - Transportation    In the past 12 months, has lack of transportation kept you from medical appointments or from getting medications?: No    Lack of Transportation (Non-Medical): No  Physical Activity: Not on file  Stress: Not on file  Social Connections: Not on file  Intimate Partner Violence: Not on file    Medications:   Current Outpatient Medications on File Prior to Visit  Medication Sig Dispense Refill   Acetaminophen  (TYLENOL  PO) Take 3 tablets by mouth 2 (two) times daily.     ACETYLCARNITINE HCL, NUTRIENT, PO Take 600 mg by mouth daily.     Aflibercept (EYLEA) 2 MG/0.05ML SOLN Place 2 mg into both eyes every 7 (seven) weeks.     albuterol (PROVENTIL HFA;VENTOLIN HFA) 108 (90 Base) MCG/ACT inhaler Inhale 2 puffs into the lungs every 4 (four) hours as needed.      Alpha-Lipoic Acid 300 MG CAPS Take 1 capsule by mouth daily.     b complex vitamins capsule Take 1 capsule by mouth daily.      budesonide-formoterol (SYMBICORT) 160-4.5 MCG/ACT inhaler Inhale 2 puffs into the lungs 2 (two) times daily.      cholecalciferol (VITAMIN D) 1000 UNITS tablet Take 4,000 Units by mouth daily.     clonazePAM (KLONOPIN) 0.5 MG tablet TAKE 1 TABLET BY MOUTH THREE TIMES DAILY AS NEEDED. MAKE APPOINTMENT NOW     clopidogrel  (PLAVIX ) 75 MG tablet Take 75 mg by mouth daily.     dextroamphetamine (DEXTROSTAT) 5 MG tablet Take 5 mg by mouth 3 (three) times daily.     dorzolamide (TRUSOPT) 2 % ophthalmic solution Place 1 drop into both eyes 2 (two) times daily.      fluticasone (FLONASE) 50 MCG/ACT nasal spray Place 2 sprays into the nose daily.     Ginger, Zingiber officinalis, (GINGER ROOT PO) Take 2,200 mg by mouth daily.     guaifenesin (HUMIBID E) 400 MG TABS tablet Take 400 mg by mouth every 4 (four) hours as needed.     Kava, Piper methysticum, (KAVA KAVA PO) Take 250 mg by mouth daily.     latanoprost (  XALATAN) 0.005 % ophthalmic solution Place 1 drop into both eyes at bedtime.     losartan  (COZAAR ) 100 MG tablet Take 100 mg by mouth daily.     magnesium oxide (MAG-OX) 400 MG tablet Take 400 mg by mouth daily.     Menaquinone-7 (VITAMIN K2 PO) Take 1 capsule by mouth daily.     Multiple Vitamins-Minerals (PRESERVISION AREDS 2) CAPS Take 1 capsule by mouth daily at 12 noon.     NASAL SALINE NA Place 2 sprays into both nostrils as needed (sinus).      niacin (VITAMIN B3) 100 MG tablet Take 100 mg by mouth at bedtime.     ofloxacin (OCUFLOX) 0.3 % ophthalmic solution Place 1 drop into both eyes 2 (two) times daily.     Omega-3 Fatty Acids (FISH OIL PO) Take 1 tablet by mouth at bedtime.     oxyCODONE  (OXY IR/ROXICODONE ) 5 MG immediate release tablet Take 5 mg by mouth 4 (four) times daily as needed.     oxymetazoline (AFRIN) 0.05 % nasal spray Place 2 sprays into the nose daily as needed.      pantoprazole  (PROTONIX ) 40 MG tablet Take 20-40 mg by mouth daily.     rosuvastatin (CRESTOR) 10 MG tablet Take 10 mg by mouth 3 (three) times a week.     terazosin  (HYTRIN ) 2 MG capsule Take 2 mg by mouth. 4 tablets at night     No current facility-administered medications on file prior to visit.    Allergies:   Allergies  Allergen Reactions   Penicillins     unknown   Persantine [Dipyridamole] Other (See Comments)    Patient cannot recall reaction, perhaps mental fog zombie state   Tramadol Other (See Comments)    irritability   Hydrocodone     migraines    Physical Exam General: well developed, well nourished, seated, in no evident  distress Head: head normocephalic and atraumatic.   Neck: supple with no carotid or supraclavicular bruits Cardiovascular: regular rate and rhythm, no murmurs Musculoskeletal: Severe kyphoscoliosis Skin:  no rash/petichiae Vascular:  Normal pulses all extremities  Neurologic Exam Mental Status: Awake and fully alert. Oriented to place and time. Recent and remote memory intact. Attention span, concentration and fund of knowledge appropriate. Mood and affect appropriate.  Geriatric depression scale 10 mild depression.  MMSE 25/30. Cranial Nerves: Fundoscopic exam reveals sharp disc margins. Pupils equal, briskly reactive to light. Extraocular movements full without nystagmus. Visual fields full to confrontation. Hearing intact. Facial sensation intact. Face, tongue, palate moves normally and symmetrically.  Motor: Normal bulk and tone. Normal strength in all tested extremity muscles. Sensory.: intact to touch , pinprick , position and vibratory sensation.  Coordination: Rapid alternating movements normal in all extremities. Finger-to-nose and heel-to-shin performed accurately bilaterally. Gait and Station: Arises from chair with  difficulty. Stance is stooped with severe kyphoscoliosis.  Slightly ataxic gait.  Unable to walk tandem reflexes: 1+ and symmetric. Toes downgoing.   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  Remote history of right hemispheric TIA.  Vascular risk factors of mild hyperlipidemia,  marijuana use and history of TIA.  He also has mild cognitive impairment which is age-appropriate.     PLAN:I had a long d/w patien and his wife  about his recent small MRI negative stroke, mild cognitive impairment, risk for recurrent stroke/TIAs, personally independently reviewed imaging studies and stroke evaluation results and answered questions.Continue Plavix  75 mg daily for secondary stroke prevention and maintain strict control of hypertension with blood pressure goal below 130/90, diabetes with hemoglobin A1c goal below 6.5% and lipids with LDL cholesterol goal below 70 mg/dL. I also advised the patient to eat a healthy diet with plenty of whole grains, cereals, fruits and vegetables, exercise regularly and maintain ideal body weight .I encouraged him to quit using marijuana due to increased stroke risk.  Check memory panel labs, lipid profile and hemoglobin A1c today and 30-day heart monitor for paroxysmal A-fib.  We also discussed his mild cognitive impairment and recommend increase participation in cognitively challenging activities like solving crossword puzzles, playing bridge and sudoku.  We also discussed memory compensation strategies.  Followup in the future with me in 6 months or call earlier if necessary.   I personally spent a total of 50 minutes in the care of the patient today including getting/reviewing separately obtained history, performing a medically appropriate exam/evaluation, counseling and educating, placing orders, referring and communicating with other health care professionals, documenting clinical information in the EHR, independently interpreting results, and coordinating care.       Eather Popp, MD  Note: This document was prepared with digital dictation and possible smart phrase technology. Any transcriptional errors that result from this process are unintentional.

## 2024-08-29 NOTE — Patient Instructions (Signed)
 I had a long d/w patien and his wife  about his recent small MRI negative stroke, mild cognitive impairment, risk for recurrent stroke/TIAs, personally independently reviewed imaging studies and stroke evaluation results and answered questions.Continue Plavix  75 mg daily for secondary stroke prevention and maintain strict control of hypertension with blood pressure goal below 130/90, diabetes with hemoglobin A1c goal below 6.5% and lipids with LDL cholesterol goal below 70 mg/dL. I also advised the patient to eat a healthy diet with plenty of whole grains, cereals, fruits and vegetables, exercise regularly and maintain ideal body weight .I encouraged him to quit using marijuana due to increased stroke risk.  Check memory panel labs, lipid profile and hemoglobin A1c today and 30-day heart monitor for paroxysmal A-fib.  We also discussed his mild cognitive impairment and recommend increase participation in cognitively challenging activities like solving crossword puzzles, playing bridge and sudoku.  We also discussed memory compensation strategies.  Followup in the future with me in 6 months or call earlier if necessary.  Memory Compensation Strategies  Use WARM strategy.  W= write it down  A= associate it  R= repeat it  M= make a mental note  2.   You can keep a Glass Blower/designer.  Use a 3-ring notebook with sections for the following: calendar, important names and phone numbers,  medications, doctors' names/phone numbers, lists/reminders, and a section to journal what you did  each day.   3.    Use a calendar to write appointments down.  4.    Write yourself a schedule for the day.  This can be placed on the calendar or in a separate section of the Memory Notebook.  Keeping a  regular schedule can help memory.  5.    Use medication organizer with sections for each day or morning/evening pills.  You may need help loading it  6.    Keep a basket, or pegboard by the door.  Place items that you  need to take out with you in the basket or on the pegboard.  You may also want to  include a message board for reminders.  7.    Use sticky notes.  Place sticky notes with reminders in a place where the task is performed.  For example:  turn off the  stove placed by the stove, lock the door placed on the door at eye level,  take your medications on  the bathroom mirror or by the place where you normally take your medications.  8.    Use alarms/timers.  Use while cooking to remind yourself to check on food or as a reminder to take your medicine, or as a  reminder to make a call, or as a reminder to perform another task, etc.  Stroke Prevention Some medical conditions and behaviors can lead to a higher chance of having a stroke. You can help prevent a stroke by eating healthy, exercising, not smoking, and managing any medical conditions you have. Stroke is a leading cause of functional impairment. Primary prevention is particularly important because a majority of strokes are first-time events. Stroke changes the lives of not only those who experience a stroke but also their family and other caregivers. How can this condition affect me? A stroke is a medical emergency and should be treated right away. A stroke can lead to brain damage and can sometimes be life-threatening. If a person gets medical treatment right away, there is a better chance of surviving and recovering from a stroke. What can  increase my risk? The following medical conditions may increase your risk of a stroke: Cardiovascular disease. High blood pressure (hypertension). Diabetes. High cholesterol. Sickle cell disease. Blood clotting disorders (hypercoagulable state). Obesity. Sleep disorders (obstructive sleep apnea). Other risk factors include: Being older than age 73. Having a history of blood clots, stroke, or mini-stroke (transient ischemic attack, TIA). Genetic factors, such as race, ethnicity, or a family history  of stroke. Smoking cigarettes or using other tobacco products. Taking birth control pills, especially if you also use tobacco. Heavy use of alcohol or drugs, especially cocaine and methamphetamine. Physical inactivity. What actions can I take to prevent this? Manage your health conditions High cholesterol levels. Eating a healthy diet is important for preventing high cholesterol. If cholesterol cannot be managed through diet alone, you may need to take medicines. Take any prescribed medicines to control your cholesterol as told by your health care provider. Hypertension. To reduce your risk of stroke, try to keep your blood pressure below 130/80. Eating a healthy diet and exercising regularly are important for controlling blood pressure. If these steps are not enough to manage your blood pressure, you may need to take medicines. Take any prescribed medicines to control hypertension as told by your health care provider. Ask your health care provider if you should monitor your blood pressure at home. Have your blood pressure checked every year, even if your blood pressure is normal. Blood pressure increases with age and some medical conditions. Diabetes. Eating a healthy diet and exercising regularly are important parts of managing your blood sugar (glucose). If your blood sugar cannot be managed through diet and exercise, you may need to take medicines. Take any prescribed medicines to control your diabetes as told by your health care provider. Get evaluated for obstructive sleep apnea. Talk to your health care provider about getting a sleep evaluation if you snore a lot or have excessive sleepiness. Make sure that any other medical conditions you have, such as atrial fibrillation or atherosclerosis, are managed. Nutrition Follow instructions from your health care provider about what to eat or drink to help manage your health condition. These instructions may include: Reducing your daily  calorie intake. Limiting how much salt (sodium) you use to 1,500 milligrams (mg) each day. Using only healthy fats for cooking, such as olive oil, canola oil, or sunflower oil. Eating healthy foods. You can do this by: Choosing foods that are high in fiber, such as whole grains, and fresh fruits and vegetables. Eating at least 5 servings of fruits and vegetables a day. Try to fill one-half of your plate with fruits and vegetables at each meal. Choosing lean protein foods, such as lean cuts of meat, poultry without skin, fish, tofu, beans, and nuts. Eating low-fat dairy products. Avoiding foods that are high in sodium. This can help lower blood pressure. Avoiding foods that have saturated fat, trans fat, and cholesterol. This can help prevent high cholesterol. Avoiding processed and prepared foods. Counting your daily carbohydrate intake.  Lifestyle If you drink alcohol: Limit how much you have to: 0-1 drink a day for women who are not pregnant. 0-2 drinks a day for men. Know how much alcohol is in your drink. In the [place] one drink equals one 12 oz bottle of beer ( ), one 5 oz glass of wine ( ), or one 1 oz glass of hard liquor (44mL). Do not use any products that contain nicotine or tobacco. These products include cigarettes, chewing tobacco, and vaping devices, such as e-cigarettes. If you  need help quitting, ask your health care provider. Avoid secondhand smoke. Do not use drugs. Activity  Try to stay at a healthy weight. Get at least 30 minutes of exercise on most days, such as: Fast walking. Biking. Swimming. Medicines Take over-the-counter and prescription medicines only as told by your health care provider. Aspirin  or blood thinners (antiplatelets or anticoagulants) may be recommended to reduce your risk of forming blood clots that can lead to stroke. Avoid taking birth control pills. Talk to your health care provider about the risks of taking birth control pills  if: You are over 26 years old. You smoke. You get very bad headaches. You have had a blood clot. Where to find more information American Stroke Association: www.strokeassociation.org Get help right away if: You or a loved one has any symptoms of a stroke. BE FAST is an easy way to remember the main warning signs of a stroke: B - Balance. Signs are dizziness, sudden trouble walking, or loss of balance. E - Eyes. Signs are trouble seeing or a sudden change in vision. F - Face. Signs are sudden weakness or numbness of the face, or the face or eyelid drooping on one side. A - Arms. Signs are weakness or numbness in an arm. This happens suddenly and usually on one side of the body. S - Speech. Signs are sudden trouble speaking, slurred speech, or trouble understanding what people say. T - Time. Time to call emergency services. Write down what time symptoms started. You or a loved one has other signs of a stroke, such as: A sudden, severe headache with no known cause. Nausea or vomiting. Seizure. These symptoms may represent a serious problem that is an emergency. Do not wait to see if the symptoms will go away. Get medical help right away. Call your local emergency services (911 in the [place] Do not drive yourself to the hospital. Summary You can help to prevent a stroke by eating healthy, exercising, not smoking, limiting alcohol intake, and managing any medical conditions you may have. Do not use any products that contain nicotine or tobacco. These include cigarettes, chewing tobacco, and vaping devices, such as e-cigarettes. If you need help quitting, ask your health care provider. Remember BE FAST for warning signs of a stroke. Get help right away if you or a loved one has any of these signs. This information is not intended to replace advice given to you by your health care provider. Make sure you discuss any questions you have with your health care provider. Document Revised: 09/05/2022  Document Reviewed: 09/05/2022 Elsevier Patient Education  2024 [employer].

## 2024-08-30 LAB — LIPID PANEL
Chol/HDL Ratio: 2.3 ratio (ref 0.0–5.0)
Cholesterol, Total: 245 mg/dL — ABNORMAL HIGH (ref 100–199)
HDL: 107 mg/dL (ref >39)
LDL Chol Calc (NIH): 122 mg/dL — ABNORMAL HIGH (ref 0–99)
Triglycerides: 97 mg/dL (ref 0–149)
VLDL Cholesterol Cal: 16 mg/dL (ref 5–40)

## 2024-08-30 LAB — HEMOGLOBIN A1C
Est. average glucose Bld gHb Est-mCnc: 111 mg/dL
Hgb A1c MFr Bld: 5.5 % (ref 4.8–5.6)

## 2024-08-30 LAB — DEMENTIA PANEL
Homocysteine: 12.2 umol/L (ref 0.0–21.3)
RPR Ser Ql: NONREACTIVE
TSH: 0.292 u[IU]/mL — ABNORMAL LOW (ref 0.450–4.500)
Vitamin B-12: 1038 pg/mL (ref 232–1245)

## 2024-09-02 ENCOUNTER — Encounter: Payer: Self-pay | Admitting: Physician Assistant

## 2024-09-02 ENCOUNTER — Ambulatory Visit: Admitting: Physician Assistant

## 2024-09-02 DIAGNOSIS — M1712 Unilateral primary osteoarthritis, left knee: Secondary | ICD-10-CM

## 2024-09-02 MED ORDER — HYALURONAN 88 MG/4ML IX SOSY
88.0000 mg | PREFILLED_SYRINGE | INTRA_ARTICULAR | Status: AC | PRN
  Administered 2024-09-02: 88 mg via INTRA_ARTICULAR

## 2024-09-02 NOTE — Progress Notes (Signed)
   Procedure Note  Patient: Jesse Robbins             Date of Birth: 12-Feb-1941           MRN: [id number]             Visit Date: 09/02/2024 HPI: [name] comes in today for scheduled Monovisc injection left knee.  He is someone who has known left knee osteoarthritis.  Has failed conservative treatment which has included injections with cortisone.  He has also worked on dance movement psychotherapist.  He is unable to take NSAIDs due to the fact that he is on Plavix .  He has no scheduled surgery on the left knee in the next 6 months.  Physical exam: Left knee no abnormal warmth or erythema.  Positive effusion.  Good range of motion of the knee.  Procedures: Visit Diagnoses:  1. Unilateral primary osteoarthritis, left knee     Large Joint Inj on 09/02/2024 5:10 PM Indications: pain Details: 22 G 1.5 in needle, anterolateral approach  Arthrogram: No  Medications: 88 mg Hyaluronan 88 MG/4ML Outcome: tolerated well, no immediate complications Procedure, treatment alternatives, risks and benefits explained, specific risks discussed. Consent was given by the patient. Immediately prior to procedure a time out was called to verify the correct patient, procedure, equipment, support staff and site/side marked as required. Patient was prepped and draped in the usual sterile fashion.      Plan: He knows to wait 6 months between viscosupplementation injections.  Questions were encouraged and answered today.  He was wrapped with an Ace bandage which he will remove this evening before retiring to bed.

## 2024-09-09 ENCOUNTER — Other Ambulatory Visit: Payer: Self-pay | Admitting: Neurology

## 2024-09-09 ENCOUNTER — Ambulatory Visit: Payer: Self-pay | Admitting: Neurology

## 2024-09-09 MED ORDER — ATORVASTATIN CALCIUM 40 MG PO TABS: 40.0000 mg | ORAL_TABLET | Freq: Every day | ORAL | 1 refills | Status: AC

## 2024-09-18 ENCOUNTER — Other Ambulatory Visit: Payer: Self-pay | Admitting: Family Medicine

## 2024-09-18 DIAGNOSIS — M4807 Spinal stenosis, lumbosacral region: Secondary | ICD-10-CM

## 2024-09-20 ENCOUNTER — Other Ambulatory Visit: Payer: Self-pay | Admitting: Family Medicine

## 2024-09-20 DIAGNOSIS — M4802 Spinal stenosis, cervical region: Secondary | ICD-10-CM

## 2024-10-03 ENCOUNTER — Ambulatory Visit: Admitting: Gastroenterology

## 2024-10-07 MED ORDER — ONDANSETRON HCL 4 MG/2ML IJ SOLN: 4.0000 mg | Freq: Once | INTRAMUSCULAR | Status: DC | PRN

## 2024-10-07 MED ORDER — DIAZEPAM 5 MG PO TABS: 5.0000 mg | ORAL_TABLET | Freq: Once | ORAL | Status: DC

## 2024-10-07 MED ORDER — MEPERIDINE HCL 50 MG/ML IJ SOLN: 50.0000 mg | Freq: Once | INTRAMUSCULAR | Status: DC | PRN

## 2024-10-07 NOTE — Discharge Instructions (Signed)

## 2024-10-08 ENCOUNTER — Other Ambulatory Visit

## 2024-10-08 ENCOUNTER — Inpatient Hospital Stay
Admission: RE | Admit: 2024-10-08 | Discharge: 2024-10-08 | Disposition: A | Discharge status: Home / Self Care | Source: Ambulatory Visit | Attending: Family Medicine | Admitting: Family Medicine

## 2024-10-15 ENCOUNTER — Ambulatory Visit: Attending: Neurology

## 2024-10-15 DIAGNOSIS — R29898 Other symptoms and signs involving the musculoskeletal system: Secondary | ICD-10-CM | POA: Diagnosis not present

## 2024-10-15 DIAGNOSIS — I639 Cerebral infarction, unspecified: Secondary | ICD-10-CM

## 2024-10-21 ENCOUNTER — Ambulatory Visit
Admission: RE | Admit: 2024-10-21 | Discharge: 2024-10-21 | Disposition: A | Discharge status: Home / Self Care | Source: Ambulatory Visit | Attending: Family Medicine | Admitting: Family Medicine

## 2024-10-21 DIAGNOSIS — M4802 Spinal stenosis, cervical region: Secondary | ICD-10-CM

## 2025-07-15 ENCOUNTER — Ambulatory Visit: Admitting: Neurology
